# Patient Record
Sex: Male | Born: 1990 | Race: White | Hispanic: No | Marital: Single | Smoking: Never smoker
Health system: Southern US, Community
[De-identification: ages and names within clinical notes are randomized; demographics above are authoritative.]

## PROBLEM LIST (undated history)

## (undated) DIAGNOSIS — K8521 Alcohol induced acute pancreatitis with uninfected necrosis: Secondary | ICD-10-CM

## (undated) DIAGNOSIS — R569 Unspecified convulsions: Secondary | ICD-10-CM

## (undated) DIAGNOSIS — E1065 Type 1 diabetes mellitus with hyperglycemia: Secondary | ICD-10-CM

## (undated) DIAGNOSIS — F1010 Alcohol abuse, uncomplicated: Secondary | ICD-10-CM

## (undated) DIAGNOSIS — F419 Anxiety disorder, unspecified: Secondary | ICD-10-CM

## (undated) DIAGNOSIS — IMO0002 Reserved for concepts with insufficient information to code with codable children: Secondary | ICD-10-CM

## (undated) HISTORY — PX: APPENDECTOMY: SHX54

## (undated) HISTORY — DX: Alcohol induced acute pancreatitis with uninfected necrosis: K85.21

---
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
## 2010-12-07 ENCOUNTER — Emergency Department: Payer: Self-pay | Admitting: Emergency Medicine

## 2013-12-19 ENCOUNTER — Ambulatory Visit: Payer: Self-pay | Attending: Internal Medicine | Admitting: Internal Medicine

## 2013-12-19 ENCOUNTER — Encounter (HOSPITAL_COMMUNITY): Payer: Self-pay | Admitting: Emergency Medicine

## 2013-12-19 ENCOUNTER — Encounter: Payer: Self-pay | Admitting: Internal Medicine

## 2013-12-19 ENCOUNTER — Inpatient Hospital Stay (HOSPITAL_COMMUNITY)
Admission: EM | Admit: 2013-12-19 | Discharge: 2013-12-20 | DRG: 639 | Disposition: A | Discharge status: Home / Self Care | Payer: Self-pay | Attending: Internal Medicine | Admitting: Internal Medicine

## 2013-12-19 VITALS — BP 132/89 | HR 76 | Temp 98.7°F | Resp 16 | Wt 139.6 lb

## 2013-12-19 DIAGNOSIS — R3589 Other polyuria: Secondary | ICD-10-CM

## 2013-12-19 DIAGNOSIS — R824 Acetonuria: Secondary | ICD-10-CM

## 2013-12-19 DIAGNOSIS — E139 Other specified diabetes mellitus without complications: Secondary | ICD-10-CM

## 2013-12-19 DIAGNOSIS — E1139 Type 2 diabetes mellitus with other diabetic ophthalmic complication: Secondary | ICD-10-CM | POA: Insufficient documentation

## 2013-12-19 DIAGNOSIS — R739 Hyperglycemia, unspecified: Secondary | ICD-10-CM

## 2013-12-19 DIAGNOSIS — Z91199 Patient's noncompliance with other medical treatment and regimen due to unspecified reason: Secondary | ICD-10-CM

## 2013-12-19 DIAGNOSIS — R7309 Other abnormal glucose: Secondary | ICD-10-CM

## 2013-12-19 DIAGNOSIS — E860 Dehydration: Secondary | ICD-10-CM

## 2013-12-19 DIAGNOSIS — E1010 Type 1 diabetes mellitus with ketoacidosis without coma: Principal | ICD-10-CM

## 2013-12-19 DIAGNOSIS — E1165 Type 2 diabetes mellitus with hyperglycemia: Secondary | ICD-10-CM | POA: Insufficient documentation

## 2013-12-19 DIAGNOSIS — IMO0002 Reserved for concepts with insufficient information to code with codable children: Secondary | ICD-10-CM | POA: Insufficient documentation

## 2013-12-19 DIAGNOSIS — R000 Tachycardia, unspecified: Secondary | ICD-10-CM | POA: Diagnosis present

## 2013-12-19 DIAGNOSIS — G40909 Epilepsy, unspecified, not intractable, without status epilepticus: Secondary | ICD-10-CM | POA: Insufficient documentation

## 2013-12-19 DIAGNOSIS — E099 Drug or chemical induced diabetes mellitus without complications: Secondary | ICD-10-CM

## 2013-12-19 DIAGNOSIS — E1169 Type 2 diabetes mellitus with other specified complication: Principal | ICD-10-CM

## 2013-12-19 DIAGNOSIS — Z599 Problem related to housing and economic circumstances, unspecified: Secondary | ICD-10-CM

## 2013-12-19 DIAGNOSIS — I498 Other specified cardiac arrhythmias: Secondary | ICD-10-CM | POA: Diagnosis present

## 2013-12-19 DIAGNOSIS — R358 Other polyuria: Secondary | ICD-10-CM

## 2013-12-19 DIAGNOSIS — Z9119 Patient's noncompliance with other medical treatment and regimen: Secondary | ICD-10-CM

## 2013-12-19 DIAGNOSIS — H538 Other visual disturbances: Secondary | ICD-10-CM

## 2013-12-19 DIAGNOSIS — Z598 Other problems related to housing and economic circumstances: Secondary | ICD-10-CM

## 2013-12-19 HISTORY — DX: Type 1 diabetes mellitus with hyperglycemia: E10.65

## 2013-12-19 HISTORY — DX: Reserved for concepts with insufficient information to code with codable children: IMO0002

## 2013-12-19 HISTORY — DX: Unspecified convulsions: R56.9

## 2013-12-19 HISTORY — DX: Anxiety disorder, unspecified: F41.9

## 2013-12-19 LAB — BASIC METABOLIC PANEL
ANION GAP: 18 — AB (ref 5–15)
ANION GAP: 17 — AB (ref 5–15)
ANION GAP: 14 (ref 5–15)
BUN: 8 mg/dL (ref 6–23)
BUN: 7 mg/dL (ref 6–23)
BUN: 7 mg/dL (ref 6–23)
CALCIUM: 9 mg/dL (ref 8.4–10.5)
CALCIUM: 8.2 mg/dL — AB (ref 8.4–10.5)
CALCIUM: 8.4 mg/dL (ref 8.4–10.5)
CO2: 19 meq/L (ref 19–32)
CO2: 21 mEq/L (ref 19–32)
CO2: 23 meq/L (ref 19–32)
Chloride: 100 mEq/L (ref 96–112)
Chloride: 101 mEq/L (ref 96–112)
Chloride: 101 mEq/L (ref 96–112)
Creatinine, Ser: 0.45 mg/dL — ABNORMAL LOW (ref 0.50–1.35)
Creatinine, Ser: 0.49 mg/dL — ABNORMAL LOW (ref 0.50–1.35)
Creatinine, Ser: 0.46 mg/dL — ABNORMAL LOW (ref 0.50–1.35)
GFR calc Af Amer: >90 mL/min (ref >90)
GFR calc Af Amer: >90 mL/min (ref >90)
GFR calc Af Amer: >90 mL/min (ref >90)
GFR calc non Af Amer: >90 mL/min (ref >90)
GFR calc non Af Amer: >90 mL/min (ref >90)
GFR calc non Af Amer: >90 mL/min (ref >90)
GLUCOSE: 177 mg/dL — AB (ref 70–99)
Glucose, Bld: 139 mg/dL — ABNORMAL HIGH (ref 70–99)
Glucose, Bld: 168 mg/dL — ABNORMAL HIGH (ref 70–99)
POTASSIUM: 3.8 meq/L (ref 3.7–5.3)
POTASSIUM: 3.6 meq/L — AB (ref 3.7–5.3)
Potassium: 4.1 mEq/L (ref 3.7–5.3)
SODIUM: 137 meq/L (ref 137–147)
SODIUM: 138 meq/L (ref 137–147)
Sodium: 139 mEq/L (ref 137–147)

## 2013-12-19 LAB — POCT URINALYSIS DIPSTICK
Blood, UA: NEGATIVE
Glucose, UA: 500
Leukocytes, UA: NEGATIVE
NITRITE UA: NEGATIVE
PH UA: 5
SPEC GRAV UA: 1.015
Urobilinogen, UA: 0.2

## 2013-12-19 LAB — URINALYSIS, ROUTINE W REFLEX MICROSCOPIC
BILIRUBIN URINE: NEGATIVE
Glucose, UA: >1000 mg/dL — AB
HGB URINE DIPSTICK: NEGATIVE
Ketones, ur: >80 mg/dL — AB
LEUKOCYTES UA: NEGATIVE
NITRITE: NEGATIVE
Protein, ur: 30 mg/dL — AB
SPECIFIC GRAVITY, URINE: 1.041 — AB (ref 1.005–1.030)
Urobilinogen, UA: 0.2 mg/dL (ref 0.0–1.0)
pH: 5 (ref 5.0–8.0)

## 2013-12-19 LAB — URINE MICROSCOPIC-ADD ON

## 2013-12-19 LAB — COMPREHENSIVE METABOLIC PANEL
ALK PHOS: 119 U/L — AB (ref 39–117)
ALT: 14 U/L (ref 0–53)
AST: 21 U/L (ref 0–37)
Albumin: 4.7 g/dL (ref 3.5–5.2)
Anion gap: 27 — ABNORMAL HIGH (ref 5–15)
BILIRUBIN TOTAL: 0.5 mg/dL (ref 0.3–1.2)
BUN: 10 mg/dL (ref 6–23)
CO2: 16 meq/L — AB (ref 19–32)
Calcium: 9.9 mg/dL (ref 8.4–10.5)
Chloride: 93 mEq/L — ABNORMAL LOW (ref 96–112)
Creatinine, Ser: 0.49 mg/dL — ABNORMAL LOW (ref 0.50–1.35)
GFR calc Af Amer: >90 mL/min (ref >90)
GFR calc non Af Amer: >90 mL/min (ref >90)
Glucose, Bld: 331 mg/dL — ABNORMAL HIGH (ref 70–99)
POTASSIUM: 3.8 meq/L (ref 3.7–5.3)
Sodium: 136 mEq/L — ABNORMAL LOW (ref 137–147)
TOTAL PROTEIN: 7.9 g/dL (ref 6.0–8.3)

## 2013-12-19 LAB — GLUCOSE, CAPILLARY
GLUCOSE-CAPILLARY: 122 mg/dL — AB (ref 70–99)
GLUCOSE-CAPILLARY: 166 mg/dL — AB (ref 70–99)
GLUCOSE-CAPILLARY: 174 mg/dL — AB (ref 70–99)
GLUCOSE-CAPILLARY: 178 mg/dL — AB (ref 70–99)
GLUCOSE-CAPILLARY: 200 mg/dL — AB (ref 70–99)
Glucose-Capillary: 170 mg/dL — ABNORMAL HIGH (ref 70–99)
Glucose-Capillary: 169 mg/dL — ABNORMAL HIGH (ref 70–99)
Glucose-Capillary: 168 mg/dL — ABNORMAL HIGH (ref 70–99)
Glucose-Capillary: 174 mg/dL — ABNORMAL HIGH (ref 70–99)

## 2013-12-19 LAB — CBC
HCT: 45.5 % (ref 39.0–52.0)
Hemoglobin: 16.5 g/dL (ref 13.0–17.0)
MCH: 34.6 pg — AB (ref 26.0–34.0)
MCHC: 36.3 g/dL — ABNORMAL HIGH (ref 30.0–36.0)
MCV: 95.4 fL (ref 78.0–100.0)
Platelets: 202 10*3/uL (ref 150–400)
RBC: 4.77 MIL/uL (ref 4.22–5.81)
RDW: 12.1 % (ref 11.5–15.5)
WBC: 5.7 10*3/uL (ref 4.0–10.5)

## 2013-12-19 LAB — CBG MONITORING, ED: Glucose-Capillary: 346 mg/dL — ABNORMAL HIGH (ref 70–99)

## 2013-12-19 LAB — POCT GLYCOSYLATED HEMOGLOBIN (HGB A1C): HEMOGLOBIN A1C: 13.9

## 2013-12-19 LAB — GLUCOSE, POCT (MANUAL RESULT ENTRY): POC Glucose: 475 mg/dl — AB (ref 70–99)

## 2013-12-19 LAB — TSH: TSH: 2.4 u[IU]/mL (ref 0.350–4.500)

## 2013-12-19 MED ORDER — POTASSIUM CHLORIDE 10 MEQ/100ML IV SOLN
10.0000 meq | INTRAVENOUS | Status: AC
  Administered 2013-12-19 (×2): 10 meq via INTRAVENOUS
  Filled 2013-12-19 (×2): qty 100

## 2013-12-19 MED ORDER — ENOXAPARIN SODIUM 40 MG/0.4ML ~~LOC~~ SOLN
40.0000 mg | SUBCUTANEOUS | Status: DC
  Administered 2013-12-19: 40 mg via SUBCUTANEOUS
  Filled 2013-12-19 (×2): qty 0.4

## 2013-12-19 MED ORDER — ONDANSETRON HCL 4 MG/2ML IJ SOLN: 4.0000 mg | Freq: Four times a day (QID) | INTRAMUSCULAR | Status: DC | PRN

## 2013-12-19 MED ORDER — INSULIN ASPART 100 UNIT/ML ~~LOC~~ SOLN
20.0000 [IU] | Freq: Once | SUBCUTANEOUS | Status: AC
  Administered 2013-12-19: 20 [IU] via SUBCUTANEOUS

## 2013-12-19 MED ORDER — SODIUM CHLORIDE 0.9 % IV SOLN
INTRAVENOUS | Status: DC
Start: 2013-12-19 — End: 2013-12-20
  Administered 2013-12-20: 100 mL/h via INTRAVENOUS

## 2013-12-19 MED ORDER — ACETAMINOPHEN 325 MG PO TABS: 650.0000 mg | ORAL_TABLET | Freq: Four times a day (QID) | ORAL | Status: DC | PRN

## 2013-12-19 MED ORDER — POTASSIUM CHLORIDE CRYS ER 20 MEQ PO TBCR: 40.0000 meq | EXTENDED_RELEASE_TABLET | Freq: Once | ORAL | Status: DC

## 2013-12-19 MED ORDER — SODIUM CHLORIDE 0.9 % IV BOLUS (SEPSIS)
1000.0000 mL | Freq: Once | INTRAVENOUS | Status: AC
  Administered 2013-12-19: 1000 mL via INTRAVENOUS

## 2013-12-19 MED ORDER — INSULIN REGULAR HUMAN 100 UNIT/ML IJ SOLN
INTRAMUSCULAR | Status: DC
  Filled 2013-12-19: qty 2.5

## 2013-12-19 MED ORDER — DOCUSATE SODIUM 100 MG PO CAPS
100.0000 mg | ORAL_CAPSULE | Freq: Two times a day (BID) | ORAL | Status: DC
  Filled 2013-12-19 (×2): qty 1

## 2013-12-19 MED ORDER — DEXTROSE 50 % IV SOLN: 25.0000 mL | INTRAVENOUS | Status: DC | PRN

## 2013-12-19 MED ORDER — SODIUM CHLORIDE 0.9 % IV SOLN
INTRAVENOUS | Status: AC
  Administered 2013-12-19: 16:00:00 via INTRAVENOUS

## 2013-12-19 MED ORDER — DEXTROSE-NACL 5-0.45 % IV SOLN
INTRAVENOUS | Status: DC
  Administered 2013-12-19 – 2013-12-20 (×2): via INTRAVENOUS

## 2013-12-19 MED ORDER — SODIUM CHLORIDE 0.9 % IV SOLN
INTRAVENOUS | Status: AC
  Administered 2013-12-19: 0.6 [IU]/h via INTRAVENOUS
  Filled 2013-12-19: qty 2.5

## 2013-12-19 NOTE — Progress Notes (Signed)
Pt admitted to the unit at 1526. Pt mental status is A&Ox3. Pt oriented to room, staff, and call bell. Skin is intact. Full assessment charted in [facility]. Call bell within reach. Visitor guidelines reviewed w/ pt and/or family.

## 2013-12-19 NOTE — Progress Notes (Signed)
Patient here to establish care Has history of DM Has not taken his medication in over a year Patient being sent to the ED for evaluation Spoke with [place] in the ED and they are aware of his diagnosis

## 2013-12-19 NOTE — Progress Notes (Deleted)
History and Physical  STIRLING ORTON ZOX:[id number] DOB: 03-05-1991 DOA: 12/19/2013  Referring physician: Dr. Zadie Rhine, EDP. PCP: Doris Cheadle, MD  Outpatient Specialists:  1. None  Chief Complaint: Increased thirst, frequent urination, blurred vision, headache, nausea.  HPI: Christopher Robles is a 23 y.o. male with history of type 1 diabetes, seizure disorder, anxiety, has not been on any prescription medications for a little over a year secondary to lack of insurance and financial constraints, presents to the ED from [facility] with the above complaints. Patient states that he used to be on metformin 500 mg bid,  Lantus 10 units HS & NovoLog SSI with reasonable blood sugar control in the 90s-200s. He then lost his job and emergency Medicaid following which he was unable to afford medications. He has prolonged history of polyuria (20-30 times in the day and several times at night), polydipsia, intermittent tingling and numbness of fingers, leg pains, headache, blurred vision and "not thinking straight". Today he went to the home health clinic to see if they would be able to provide him with some assistance with medications. His blood sugars were noted to be markedly elevated and he had ketones in the urine. Hemoglobin A1c was 13.9. He was referred to ED for admission. In the ED, blood glucose 331 and anion gap 27. Hospitalist admission requested. Patient states that the last seizure was 3-4 years ago has not been on antiepileptics.   Review of Systems: All systems reviewed and apart from history of presenting illness, are negative.  Past Medical History  Diagnosis Date  . DM I (diabetes mellitus, type I), uncontrolled   . Anxiety   . Seizure    Past Surgical History  Procedure Laterality Date  . Appendectomy     Social History:  reports that he has never smoked. He does not have any smokeless tobacco history on file. He reports that he does not  drink alcohol or use illicit drugs. single. Unemployed. Does not have a fixed place to live and jumps around from place to place-Brother, friends and parents.   No Known Allergies  Family History  Problem Relation Age of Onset  . Cancer Maternal Aunt   . Cancer Maternal Uncle   . Heart disease Paternal Grandfather     Prior to Admission medications   Not on File   Physical Exam: Filed Vitals:   12/19/13 1415 12/19/13 1445 12/19/13 1500 12/19/13 1527  BP: 113/80 109/73 114/73   Pulse: 104 102 102 106  Temp:    98.3 F (36.8 C)  TempSrc:    Oral  Resp: Height:     (1.676 m)  Weight:    63.866 kg (140 lb 12.8 oz)  SpO2: 100% 100% 99% 99%     General exam: Moderately built and nourished  pleasant young male patient,  sitting up comfortably on the gurney in no obvious distress.  Head, eyes and ENT: Nontraumatic and normocephalic. Pupils equally reacting to light and accommodation. Oral mucosa  dry. Ketone breath.   Neck: Supple. No JVD, carotid bruit or thyromegaly. Tattoo on right side of neck.   Lymphatics: No lymphadenopathy.  Respiratory system: Clear to auscultation. No increased work of breathing.  Cardiovascular system: S1 and S2 heard, RRR. No JVD, murmurs, gallops, clicks or pedal edema.  Gastrointestinal system: Abdomen is nondistended, soft and nontender. Normal bowel sounds heard. No organomegaly or masses appreciated.  Central nervous system: Alert  and oriented. No focal neurological deficits.  Extremities: Symmetric 5 x 5 power. Peripheral pulses symmetrically felt.   Skin: No rashes or acute findings.  Musculoskeletal system: Negative exam.  Psychiatry: Pleasant and cooperative.   Labs on Admission:  Basic Metabolic Panel:  Recent Labs Lab 12/19/13 1253  NA 136*  K 3.8  CL 93*  CO2 16*  GLUCOSE 331*  BUN 10  CREATININE 0.49*  CALCIUM 9.9   Liver Function Tests:  Recent Labs Lab 12/19/13 1253  AST 21  ALT 14    ALKPHOS 119*  BILITOT 0.5  PROT 7.9  ALBUMIN 4.7   No results found for this basename: LIPASE, AMYLASE,  in the last 168 hours No results found for this basename: AMMONIA,  in the last 168 hours CBC:  Recent Labs Lab 12/19/13 1253  WBC 5.7  HGB 16.5  HCT 45.5  MCV 95.4  PLT 202   Cardiac Enzymes: No results found for this basename: CKTOTAL, CKMB, CKMBINDEX, TROPONINI,  in the last 168 hours  BNP (last 3 results) No results found for this basename: PROBNP,  in the last 8760 hours CBG:  Recent Labs Lab 12/19/13 1313  GLUCAP 346*    Radiological Exams on Admission: No results found.  EKG: No EKG seen in Epic. Will order.  Assessment/Plan Principal Problem:   DKA, type 1, not at goal - Secondary to medication noncompliance from financial constraints. - Aggressive IV fluid hydration. - Start insulin drip per DKA protocol and monitor BMP closely. - Once his DKA has resolved, transition to Lantus and SSI. - Diabetes coordinator and case management consultation for diabetes management and medication assistance respectively. - Patient states that he may be able to afford up to $ 50 worth of medications per month by assistance from his family (Relion brand [employer] Insulin may be an option)  Active Problems:   Dehydration - IVF    Sinus tachycardia - Check EKG and TSH. - Asymptomatic    History of seizure disorder - No reported seizure for 3-4 years    Anxiety - Not on medications    Noncompliance with diabetes treatment/ Financial difficulties - CM assistance for medication Mx.      Code Status: Full  Family Communication: None at the bedside.  Disposition Plan: Home when medically stable   Time spent: 55 minutes.  Marcellus Scott, MD, FACP, FHM. [facility] Pager [phone]  If 7PM-7AM, please contact night-coverage [email or web address] Password [facility] 12/19/2013, 3:31 PM

## 2013-12-19 NOTE — ED Provider Notes (Signed)
CSN: [id number]     Arrival date & time 12/19/13  1240 History   First MD Initiated Contact with Patient 12/19/13 1305     Chief Complaint  Patient presents with  . Hyperglycemia      Patient is a 23 y.o. male presenting with hyperglycemia. The history is provided by the patient.  Hyperglycemia Severity:  Severe Onset quality:  Gradual Duration: several weeks. Timing:  Constant Progression:  Worsening Chronicity:  Recurrent Current diabetic therapy:  None Relieved by:  None tried Ineffective treatments:  None tried Associated symptoms: blurred vision, increased thirst and polyuria   Associated symptoms: no abdominal pain, no chest pain, no fever and no shortness of breath   Pt reports he has h/o untreated diabetes He has noted increased glucose readings at home He also reports blurred vision, polyuria and feels thirsty   PMH - DIABETES  Past Surgical History  Procedure Laterality Date  . Appendectomy     Family History  Problem Relation Age of Onset  . Cancer Maternal Aunt   . Cancer Maternal Uncle   . Heart disease Paternal Grandfather    History  Substance Use Topics  . Smoking status: Never Smoker   . Smokeless tobacco: Not on file  . Alcohol Use: No    Review of Systems  Constitutional: Negative for fever.  Eyes: Positive for blurred vision.  Respiratory: Negative for shortness of breath.   Cardiovascular: Negative for chest pain.  Gastrointestinal: Negative for abdominal pain.  Endocrine: Positive for polydipsia and polyuria.  All other systems reviewed and are negative.     Allergies  Review of patient's allergies indicates no known allergies.  Home Medications   Prior to Admission medications   Not on File   BP 154/99  Pulse 135  Temp(Src) 97.9 F (36.6 C) (Oral)  Resp 18  SpO2 100% Physical Exam CONSTITUTIONAL: Well developed/well nourished, pt smells ketotic HEAD: Normocephalic/atraumatic EYES: EOMI/PERRL ENMT: Mucous membranes  moist NECK: supple no meningeal signs SPINE:entire spine nontender CV: tachycardic S1/S2 noted, no murmurs/rubs/gallops noted LUNGS: Lungs are clear to auscultation bilaterally, no apparent distress ABDOMEN: soft, nontender, no rebound or guarding GU:no cva tenderness NEURO: Pt is awake/alert, moves all extremitiesx4 EXTREMITIES: pulses normal, full ROM SKIN: warm, color normal PSYCH: no abnormalities of mood noted   ED Course  Procedures 1:45 PM Suspect DKA 2:43 PM PT WILL BE ADMITTED TO TELE AFTER DISCUSSION WITH DR HONGALGI PT AWAKE/ALERT, NO DISTRESS, BUT WILL REQUIRE IV FLUIDS,IV INSULIN AND MONITORING OF HYPOKALEMIA Labs Review Labs Reviewed  CBC - Abnormal; Notable for the following:    MCH 34.6 (*)    MCHC 36.3 (*)    All other components within normal limits  COMPREHENSIVE METABOLIC PANEL - Abnormal; Notable for the following:    Sodium 136 (*)    Chloride 93 (*)    CO2 16 (*)    Glucose, Bld 331 (*)    Creatinine, Ser 0.49 (*)    Alkaline Phosphatase 119 (*)    Anion gap 27 (*)    All other components within normal limits  URINALYSIS, ROUTINE W REFLEX MICROSCOPIC - Abnormal; Notable for the following:    APPearance HAZY (*)    Specific Gravity, Urine 1.041 (*)    Glucose, UA >1000 (*)    Ketones, ur >80 (*)    Protein, ur 30 (*)    All other components within normal limits  CBG MONITORING, ED - Abnormal; Notable for the following:    Glucose-Capillary 346 (*)  All other components within normal limits  URINE MICROSCOPIC-ADD ON  BLOOD GAS, VENOUS     MDM   Final diagnoses:  Diabetic ketoacidosis without coma associated with type 1 diabetes mellitus    Nursing notes including past medical history and social history reviewed and considered in documentation Labs/vital reviewed and considered     Joya Gaskins, MD 12/19/13 1444

## 2013-12-19 NOTE — ED Notes (Signed)
PT from PCP, sent here to be evaluated for possible DKA. Pt has Type 1 DM, states he has been out of medication for appx 1 year. Pt denies any complaint. Noted to be tachy 120-130's in triage. AO x4, NAD.

## 2013-12-19 NOTE — H&P (Signed)
History and Physical  Christopher FRAIZER Robles:[id number] DOB: 07-15-1990 DOA: 12/19/2013  Referring physician: Dr. Zadie Rhine, EDP. PCP: Doris Cheadle, MD  Outpatient Specialists:  1. None  Chief Complaint: Increased thirst, frequent urination, blurred vision, headache, nausea.  HPI: Christopher Robles is a 23 y.o. male with history of type 1 diabetes, seizure disorder, anxiety, has not been on any prescription medications for a little over a year secondary to lack of insurance and financial constraints, presents to the ED from [facility] with the above complaints. Patient states that he used to be on metformin 500 mg bid,  Lantus 10 units HS & NovoLog SSI with reasonable blood sugar control in the 90s-200s. He then lost his job and emergency Medicaid following which he was unable to afford medications. He has prolonged history of polyuria (20-30 times in the day and several times at night), polydipsia, intermittent tingling and numbness of fingers, leg pains, headache, blurred vision and "not thinking straight". Today he went to the home health clinic to see if they would be able to provide him with some assistance with medications. His blood sugars were noted to be markedly elevated and he had ketones in the urine. Hemoglobin A1c was 13.9. He was referred to ED for admission. In the ED, blood glucose 331 and anion gap 27. Hospitalist admission requested. Patient states that the last seizure was 3-4 years ago has not been on antiepileptics.   Review of Systems: All systems reviewed and apart from history of presenting illness, are negative.  Past Medical History  Diagnosis Date  . DM I (diabetes mellitus, type I), uncontrolled   . Anxiety   . Seizure    Past Surgical History  Procedure Laterality Date  . Appendectomy     Social History:  reports that he has never smoked. He does not have any smokeless tobacco history on file. He reports that he does not  drink alcohol or use illicit drugs. single. Unemployed. Does not have a fixed place to live and jumps around from place to place-Brother, friends and parents.   No Known Allergies  Family History  Problem Relation Age of Onset  . Cancer Maternal Aunt   . Cancer Maternal Uncle   . Heart disease Paternal Grandfather     Prior to Admission medications   Not on File   Physical Exam: Filed Vitals:   12/19/13 1415 12/19/13 1445 12/19/13 1500 12/19/13 1527  BP: 113/80 109/73 114/73   Pulse: 104 102 102 106  Temp:    98.3 F (36.8 C)  TempSrc:    Oral  Resp: Height:     (1.676 m)  Weight:    63.866 kg (140 lb 12.8 oz)  SpO2: 100% 100% 99% 99%     General exam: Moderately built and nourished  pleasant young male patient,  sitting up comfortably on the gurney in no obvious distress.  Head, eyes and ENT: Nontraumatic and normocephalic. Pupils equally reacting to light and accommodation. Oral mucosa  dry. Ketone breath.   Neck: Supple. No JVD, carotid bruit or thyromegaly. Tattoo on right side of neck.   Lymphatics: No lymphadenopathy.  Respiratory system: Clear to auscultation. No increased work of breathing.  Cardiovascular system: S1 and S2 heard, RRR. No JVD, murmurs, gallops, clicks or pedal edema.  Gastrointestinal system: Abdomen is nondistended, soft and nontender. Normal bowel sounds heard. No organomegaly or masses appreciated.  Central nervous system: Alert  and oriented. No focal neurological deficits.  Extremities: Symmetric 5 x 5 power. Peripheral pulses symmetrically felt.   Skin: No rashes or acute findings.  Musculoskeletal system: Negative exam.  Psychiatry: Pleasant and cooperative.   Labs on Admission:  Basic Metabolic Panel:  Recent Labs Lab 12/19/13 1253  NA 136*  K 3.8  CL 93*  CO2 16*  GLUCOSE 331*  BUN 10  CREATININE 0.49*  CALCIUM 9.9   Liver Function Tests:  Recent Labs Lab 12/19/13 1253  AST 21  ALT 14    ALKPHOS 119*  BILITOT 0.5  PROT 7.9  ALBUMIN 4.7   No results found for this basename: LIPASE, AMYLASE,  in the last 168 hours No results found for this basename: AMMONIA,  in the last 168 hours CBC:  Recent Labs Lab 12/19/13 1253  WBC 5.7  HGB 16.5  HCT 45.5  MCV 95.4  PLT 202   Cardiac Enzymes: No results found for this basename: CKTOTAL, CKMB, CKMBINDEX, TROPONINI,  in the last 168 hours  BNP (last 3 results) No results found for this basename: PROBNP,  in the last 8760 hours CBG:  Recent Labs Lab 12/19/13 1313 12/19/13 1538 12/19/13 1639  GLUCAP 346* 122* 166*    Radiological Exams on Admission: No results found.  EKG: No EKG seen in Epic. Will order.  Assessment/Plan Principal Problem:   DKA, type 1, not at goal - Secondary to medication noncompliance from financial constraints. - Aggressive IV fluid hydration. - Start insulin drip per DKA protocol and monitor BMP closely. - Once his DKA has resolved, transition to Lantus and SSI. - Diabetes coordinator and case management consultation for diabetes management and medication assistance respectively. - Patient states that he may be able to afford up to $ 50 worth of medications per month by assistance from his family (Relion brand [employer] Insulin may be an option)  Active Problems:   Dehydration - IVF    Sinus tachycardia - Check EKG and TSH. - Asymptomatic    History of seizure disorder - No reported seizure for 3-4 years    Anxiety - Not on medications    Noncompliance with diabetes treatment/ Financial difficulties - CM assistance for medication Mx.      Code Status: Full  Family Communication: None at the bedside.  Disposition Plan: Home when medically stable   Time spent: 55 minutes.  Marcellus Scott, MD, FACP, FHM. [facility] Pager [phone]  If 7PM-7AM, please contact night-coverage [email or web address] Password [facility] 12/19/2013, 5:08 PM

## 2013-12-19 NOTE — Progress Notes (Signed)
Received patient assignment at 1458. Called ED for report at 1504. Awaiting patient arrival to floor.

## 2013-12-19 NOTE — Progress Notes (Signed)
  CARE MANAGEMENT ED NOTE 12/19/2013  Patient:  Christopher Robles, Christopher Robles   Account Number:  [id number]  Date Initiated:  12/19/2013  Documentation initiated by:  Ferdinand Cava  Subjective/Objective Assessment:   23 yo presenting to the ED for follow up from [facility] regarding CBG     Subjective/Objective Assessment Detail:     Action/Plan:   Patient will continue to follow at the [facility] for PCP and medication refills and management   Action/Plan Detail:   Anticipated DC Date:       Status Recommendation to Physician:   Result of Recommendation:  Agreed    DC Planning Services  CM consult  Other    Choice offered to / List presented to:            Status of service:  Completed, signed off  ED Comments:   ED Comments Detail:  This CM was consulted to discussed medication management with the patient and his step mother. This CM spoke with the patient and the step mother Montrez Marietta and they stated that they had the initial appointment at the [facility] with a PCP and then he was sent to the ED for care for high CBG. They were asking about prescription refills and resources. This CM explained that once the patient was discharged they can take the prescriptions to the [facility] to have them filled. Also discussed the orange card and they stated that they were told to walk-in on Thursday to apply for the orange card. This CM explained the orange card to the patient and step mother. Both verbalized understanding and had no other questions or concerns.

## 2013-12-19 NOTE — Progress Notes (Signed)
Patient Demographics  Christopher Robles, is a 23 y.o. male  NWG:[id number]  VHQ:[id number]  DOB - [birth year]  CC:  Chief Complaint  Patient presents with  . Establish Care    dm       HPI: Christopher Robles is a 23 y.o. male here today to establish medical care. Patient has history of diabetes, history of seizure disorder as per patient he did not have any more seizures for the last 3-4 years is not taking any medication for that, used to be on NovoLog Lantus metformin for diabetes but for the last one year he has been not on any medications reported to have symptoms of polyuria polydipsia dry mouth blurry vision numbness tingling in the feet. Today's blood sugar is elevated as per patient he has checked his blood sugar at home and usually gets around 500 mg a deciliter, his urine is positive for ketones. Patient has No headache, No chest pain, No abdominal pain - No Nausea, No new weakness tingling or numbness, No Cough - SOB.  No Known Allergies History reviewed. No pertinent past medical history. No current outpatient prescriptions on file prior to visit.   No current facility-administered medications on file prior to visit.   Family History  Problem Relation Age of Onset  . Cancer Maternal Aunt   . Cancer Maternal Uncle   . Heart disease Paternal Grandfather    History   Social History  . Marital Status: Single    Spouse Name: N/A    Number of Children: N/A  . Years of Education: N/A   Occupational History  . Not on file.   Social History Main Topics  . Smoking status: Never Smoker   . Smokeless tobacco: Not on file  . Alcohol Use: No  . Drug Use: Not on file  . Sexual Activity: Not on file   Other Topics Concern  . Not on file   Social History Narrative  . No narrative on file    Review of Systems: Constitutional: Negative for fever, +chills, diaphoresis, activity change, appetite change and fatigue. HENT: Negative for ear pain, nosebleeds, congestion, facial  swelling, rhinorrhea, neck pain, neck stiffness and ear discharge.  Eyes: Negative for pain, discharge, redness, itching and +visual disturbance. Respiratory: Negative for cough, choking, chest tightness, shortness of breath, wheezing and stridor.  Cardiovascular: Negative for chest pain, palpitations and leg swelling. Gastrointestinal: Negative for abdominal distention. Genitourinary: Negative for dysuria, urgency, +frequency, hematuria, flank pain, decreased urine volume, difficulty urinating and dyspareunia.  Musculoskeletal: Negative for back pain, joint swelling, arthralgia and gait problem. Neurological: Negative for dizziness, tremors, seizures, syncope, facial asymmetry, speech difficulty, weakness, light-headedness, numbness and headaches.  Hematological: Negative for adenopathy. Does not bruise/bleed easily. Psychiatric/Behavioral: Negative for hallucinations, behavioral problems, confusion, dysphoric mood, decreased concentration and agitation.    Objective:   Filed Vitals:   12/19/13 1201  BP: 132/89  Pulse: 76  Temp: 98.7 F (37.1 C)  Resp: 16    Physical Exam: Constitutional: Patient appears well-developed and well-nourished. No distress. HENT: Normocephalic, atraumatic, External right and left ear normal. Oropharynx is clear and moist.  Eyes: Conjunctivae and EOM are normal. PERRLA, no scleral icterus. Neck: Normal ROM. Neck supple. No JVD. No tracheal deviation. No thyromegaly. CVS: RRR, S1/S2 +, no murmurs, no gallops, no carotid bruit.  Pulmonary: Effort and breath sounds normal, no stridor, rhonchi, wheezes, rales.  Abdominal: Soft. BS +, no distension, tenderness, rebound or guarding.  Musculoskeletal: Normal range of motion. No edema  and no tenderness.  Neuro: Alert. Normal reflexes, muscle tone coordination. No cranial nerve deficit. Skin: Skin is warm and dry. No rash noted. Not diaphoretic. No erythema. No pallor. Psychiatric: Normal mood and affect.  Behavior, judgment, thought content normal.  Lab Results  Component Value Date   WBC 7.7 08/09/2007   HGB 15.6 08/09/2007   HCT 45.8 08/09/2007   MCV 97.2 08/09/2007   PLT 242 08/09/2007   Lab Results  Component Value Date   CREATININE 0.67 08/09/2007   BUN 11 08/09/2007   NA 139 08/09/2007   K 4.1 08/09/2007   CL 103 08/09/2007   CO2 23 08/09/2007    Lab Results  Component Value Date   HGBA1C 13.9 12/19/2013   Lipid Panel     Component Value Date/Time   CHOL 158 08/09/2007 2026   TRIG 146 08/09/2007 2026   HDL 36 08/09/2007 2026   CHOLHDL 4.4 Ratio 08/09/2007 2026   VLDL 29 08/09/2007 2026   LDLCALC 93 08/09/2007 2026       Assessment and plan:   1.  diabetes mellitus without complication Results for orders placed in visit on 12/19/13  GLUCOSE, POCT (MANUAL RESULT ENTRY)      Result Value Ref Range   POC Glucose 475 (*) 70 - 99 mg/dl  POCT GLYCOSYLATED HEMOGLOBIN (HGB A1C)      Result Value Ref Range   Hemoglobin A1C 13.9    POCT URINALYSIS DIPSTICK      Result Value Ref Range   Color, UA yellow     Clarity, UA clear     Glucose, UA 500     Bilirubin, UA small     Ketones, UA >=160mg /dl     Spec Grav, UA 1.610     Blood, UA neg     pH, UA 5.0     Protein, UA      Urobilinogen, UA 0.2     Nitrite, UA neg     Leukocytes, UA Negative      - Glucose (CBG) - HgB A1c - Urinalysis Dipstick - insulin aspart (novoLOG) injection 20 Units; Inject 0.2 mLs (20 Units total) into the skin once.  2. Hyperglycemia/ 3. Polyuria/4. Blurry vision/5. Urine ketones   Patient has uncontrolled diabetes with current A1c of 13.9%, patient also symptomatic elevated sugar ketones positive in urine, I have referred him to ER, patient needs to have electrolytes checked, checked for anion gap, needs IV hydration and insulin., Patient will follow up after discharge.     Doris Cheadle, MD

## 2013-12-20 DIAGNOSIS — E1010 Type 1 diabetes mellitus with ketoacidosis without coma: Secondary | ICD-10-CM

## 2013-12-20 LAB — CBC
HEMATOCRIT: 41.5 % (ref 39.0–52.0)
Hemoglobin: 15 g/dL (ref 13.0–17.0)
MCH: 35.5 pg — ABNORMAL HIGH (ref 26.0–34.0)
MCHC: 36.1 g/dL — ABNORMAL HIGH (ref 30.0–36.0)
MCV: 98.1 fL (ref 78.0–100.0)
Platelets: 173 10*3/uL (ref 150–400)
RBC: 4.23 MIL/uL (ref 4.22–5.81)
RDW: 12.3 % (ref 11.5–15.5)
WBC: 4.7 10*3/uL (ref 4.0–10.5)

## 2013-12-20 LAB — GLUCOSE, CAPILLARY
GLUCOSE-CAPILLARY: 113 mg/dL — AB (ref 70–99)
GLUCOSE-CAPILLARY: 256 mg/dL — AB (ref 70–99)
Glucose-Capillary: 168 mg/dL — ABNORMAL HIGH (ref 70–99)
Glucose-Capillary: 224 mg/dL — ABNORMAL HIGH (ref 70–99)

## 2013-12-20 LAB — BASIC METABOLIC PANEL
Anion gap: 18 — ABNORMAL HIGH (ref 5–15)
BUN: 8 mg/dL (ref 6–23)
CO2: 19 mEq/L (ref 19–32)
Calcium: 8.6 mg/dL (ref 8.4–10.5)
Chloride: 99 mEq/L (ref 96–112)
Creatinine, Ser: 0.42 mg/dL — ABNORMAL LOW (ref 0.50–1.35)
GFR calc Af Amer: >90 mL/min (ref >90)
GLUCOSE: 260 mg/dL — AB (ref 70–99)
Potassium: 3.9 mEq/L (ref 3.7–5.3)
Sodium: 136 mEq/L — ABNORMAL LOW (ref 137–147)

## 2013-12-20 LAB — TROPONIN I

## 2013-12-20 MED ORDER — INSULIN ASPART 100 UNIT/ML ~~LOC~~ SOLN: 0.0000 [IU] | Freq: Every day | SUBCUTANEOUS | Status: DC

## 2013-12-20 MED ORDER — INSULIN GLARGINE 100 UNIT/ML ~~LOC~~ SOLN
15.0000 [IU] | Freq: Every day | SUBCUTANEOUS | Status: DC
  Administered 2013-12-20: 15 [IU] via SUBCUTANEOUS
  Filled 2013-12-20 (×2): qty 0.15

## 2013-12-20 MED ORDER — INSULIN NPH ISOPHANE & REGULAR (70-30) 100 UNIT/ML ~~LOC~~ SUSP: 15.0000 [IU] | Freq: Two times a day (BID) | SUBCUTANEOUS | Status: DC

## 2013-12-20 MED ORDER — GLUCOSE 40 % PO GEL: 1.0000 | ORAL | Status: DC | PRN

## 2013-12-20 MED ORDER — INSULIN ASPART 100 UNIT/ML ~~LOC~~ SOLN
0.0000 [IU] | Freq: Three times a day (TID) | SUBCUTANEOUS | Status: DC
  Administered 2013-12-20: 5 [IU] via SUBCUTANEOUS
  Administered 2013-12-20: 8 [IU] via SUBCUTANEOUS

## 2013-12-20 MED ORDER — DEXTROSE 50 % IV SOLN: 25.0000 mL | Freq: Once | INTRAVENOUS | Status: DC | PRN

## 2013-12-20 MED ORDER — DEXTROSE 50 % IV SOLN: 50.0000 mL | Freq: Once | INTRAVENOUS | Status: DC | PRN

## 2013-12-20 MED ORDER — POTASSIUM CHLORIDE CRYS ER 20 MEQ PO TBCR
20.0000 meq | EXTENDED_RELEASE_TABLET | Freq: Once | ORAL | Status: AC
  Administered 2013-12-20: 20 meq via ORAL
  Filled 2013-12-20: qty 1

## 2013-12-20 NOTE — Discharge Instructions (Signed)

## 2013-12-20 NOTE — Discharge Summary (Signed)
Physician Discharge Summary  Christopher Robles:[id number] DOB: May 05, 1990 DOA: 12/19/2013  PCP: Doris Cheadle, MD  Admit date: 12/19/2013 Discharge date: 12/20/2013  Time spent:  minutes  Recommendations for Outpatient Follow-up:   1.  Establish a Primary Care Provider at the [facility]) for the management of your diabetes. 2. You have been prescribed Novolin for your diabetes, take 15 units twice daily, with breakfast and supper. 3. If you develop abdominal pain, numbness, tingling, muscle cramps, or visual changes, report to the ED      Discharge Condition: Stable  Diet recommendation: Carb modified diet  Filed Weights   12/19/13 1527  Weight: 63.866 kg (140 lb 12.8 oz)    History of present illness:  Christopher Robles is a 23 y.o. male with history of type 1 diabetes, seizure disorder, anxiety, presented to the ED from the [facility] with complaints of frequent urination, blurred vision,increased thirst, nausea, and headache. At the [facility] he was found to have markedly elevated ketones in his urine, and his A1c was 13.9. He reported a prolonged history of polyuria,  polydipsia, intermittent tingling and numbness of fingers, leg pains, headache, blurred vision and "not thinking straight". He stated that he was taking Metformin 500 mg bid, Lantus 10 units HS & NovoLog SSI.  However, he lost his job, and as a result, he has not been able to afford his diabetes medication for the past year.   In the ED, blood glucose was 331 and he had an anion gap 27.  He was hydrated with IV fluids, Placed on IV insulin, and then transitioned to Lantus and SSI.  Hospital Course:   DKA, type 1, not at goal   Secondary to medication noncompliance from financial constraints.   Will be on Novolog 70/30 as patient is unable to afford lantus  Diabetes coordinator and case management consultation for diabetes management and medication  assistance respectively.  -Patient has been  transitioned to subcutaneous insulin -Due to the patient's financial constraints, the patient will be discharged with novolin 70/30, 15units bid -Patient was instructed to check his CBGs before each meal and at bedtime and to keep her glycemic log -He was instructed to take his glycemic log of his primary care provider who will adjust his insulin regimen -Patient was provided a prescription for a glucometer, test strips, lancets, needles and syringes -RN provided education and the patient felt comfortable administering his own insulin prior to discharge  Diabetes mellitus type 1, poorly controlled -Hemoglobin A1c 13.9 -TSH 2.40 Dehydration    Resolved with aggressive fluid resuscitation  Sinus tachycardia   Resolved   History of seizure disorder   No reported seizure for 3-4 years  -Would not start any antiepileptic medications at this time  Anxiety   not on medication    Procedures:  None  Consultations:  None  Discharge Exam: Filed Vitals:   12/20/13 1031  BP: 121/84  Pulse: 92  Temp: 98 F (36.7 C)  Resp: 18     Exam Gen: Alert and oriented, on no acute respiratory distress  HEENT:  Normocephalic, nontraumatic.  Pupils symmetrical.  Moist oral mucosa. Ketone breath  Chest: clear to auscultate bilaterally, no ronchi or rales  Cardiac: Regular rate and rhythm, S1-S2, no rubs murmurs or gallops  Abdomen: soft, non tender, non distended, +bowel sounds. No guarding or rigidity  Extremities: Symmetrical in appearance without cyanosis, clubbing or effusion, or edema  Psychiatry: pleasant and cooperative  Discharge Instructions  Medication List         insulin NPH-regular Human (70-30) 100 UNIT/ML injection  Commonly known as:  NOVOLIN 70/30  Inject 15 Units into the skin 2 (two) times daily with a meal.       No Known Allergies    The results of significant diagnostics from this hospitalization (including imaging, microbiology, ancillary and  laboratory) are listed below for reference.    Significant Diagnostic Studies: No results found.  Microbiology: No results found for this or any previous visit (from the past 240 hour(s)).   Labs: Basic Metabolic Panel:  Recent Labs Lab 12/19/13 1253 12/19/13 1641 12/19/13 1930 12/19/13 2223 12/20/13 0810  NA 136* 137 139 138 136*  K 3.8 3.8 4.1 3.6* 3.9  CL 93* 100 101 101 99  CO2 16* GLUCOSE 331* 139* 177* 168* 260*  BUN CREATININE 0.49* 0.45* 0.49* 0.46* 0.42*  CALCIUM 9.9 9.0 8.2* 8.4 8.6   Liver Function Tests:  Recent Labs Lab 12/19/13 1253  AST 21  ALT 14  ALKPHOS 119*  BILITOT 0.5  PROT 7.9  ALBUMIN 4.7   No results found for this basename: LIPASE, AMYLASE,  in the last 168 hours No results found for this basename: AMMONIA,  in the last 168 hours CBC:  Recent Labs Lab 12/19/13 1253 12/20/13 0810  WBC 5.7 4.7  HGB 16.5 15.0  HCT 45.5 41.5  MCV 95.4 98.1  PLT 202 173   Cardiac Enzymes:  Recent Labs Lab 12/20/13 0042  TROPONINI <0.30   BNP: BNP (last 3 results) No results found for this basename: PROBNP,  in the last 8760 hours CBG:  Recent Labs Lab 12/19/13 2356 12/20/13 0052 12/20/13 0156 12/20/13 0748 12/20/13 1142  GLUCAP 200* 168* 113* 256* 224*       Signed:  Marieann Zipp, PA-C [phone]  [facility] 12/20/2013, 12:02 PM

## 2013-12-20 NOTE — Progress Notes (Addendum)
Inpatient Diabetes Program Recommendations  AACE/ADA: New Consensus Statement on Inpatient Glycemic Control (2013)  Target Ranges:  Prepandial:   less than 140 mg/dL      Peak postprandial:   less than 180 mg/dL (1-2 hours)      Critically ill patients:  140 - 180 mg/dL   Results for KWESI, SANGHA (MRN [id number]) as of 12/20/2013 08:26  Ref. Range 12/20/2013 07:48  Glucose-Capillary Latest Range: 70-99 mg/dL 409 (H)    Reason for Assessment: elevated BG and A1C 13.9%  Diabetes history: Type 1 Outpatient Diabetes medications: none x 1 year Current orders for Inpatient glycemic control: Lantus 15 units QHS, Novolog 0-15 units QAC, Novolog 0-5 units QHS  Please consider increasing Lantus to 25 units QHS. (63kg x .4= 25)  Susette Racer, RN, [place], [place], CDE Diabetes Coordinator Inpatient Diabetes Program  [phone] (Team Pager) [phone] [facility]) 12/20/2013 8:27 AM    Spoke with patient.  Lost his job and his insurance and then couldn't afford medications, 1 1/2 years ago.  States he's been diagnosed with diabetes since age 23.  He recently got a meter and strips from an aunt and this prompted him to re-start checking his blood sugars and go to the [facility] because he identified high blood sugars. Patient already feels better; no pain in his hands, no tingling in his feet or legs, no blurred vision.  Patient receptive to information- plans on attending [facility] when he is discharged.   Susette Racer, RN, BA, MHA, CDE Diabetes Coordinator Inpatient Diabetes Program  [phone] (Team Pager) [phone] [facility]) 12/20/2013 9:16 AM

## 2013-12-20 NOTE — Progress Notes (Signed)
Marchelle Gearing to be D/C'd Home per MD order.  Discussed with the patient and all questions fully answered.    Medication List         insulin NPH-regular Human (70-30) 100 UNIT/ML injection  Commonly known as:  NOVOLIN 70/30  Inject 15 Units into the skin 2 (two) times daily with a meal.        VVS, Skin clean, dry and intact without evidence of skin break down, no evidence of skin tears noted. IV catheter discontinued intact. Site without signs and symptoms of complications. Dressing and pressure applied.  An After Visit Summary was printed and given to the patient.  D/c education completed with patient/family including follow up instructions, medication list, d/c activities limitations if indicated, with other d/c instructions as indicated by MD - patient able to verbalize understanding, all questions fully answered.   Patient instructed to return to ED, call 911, or call MD for any changes in condition.   Patient refused escort out. D/C home by private auto.    Aldean Ast 12/20/2013 12:41 PM

## 2013-12-20 NOTE — Care Management Note (Signed)
    Page 1 of 1   12/20/2013     12:43:15 PM CARE MANAGEMENT NOTE 12/20/2013  Patient:  Christopher Robles, Christopher Robles   Account Number:  [id number]  Date Initiated:  12/20/2013  Documentation initiated by:  Letha Cape  Subjective/Objective Assessment:   dx dka  admit- lives alone,pta indep.     Action/Plan:   Anticipated DC Date:  12/20/2013   Anticipated DC Plan:  HOME/SELF CARE      DC Planning Services  CM consult  Follow-up appt scheduled  [facility]      Choice offered to / List presented to:             Status of service:  Completed, signed off Medicare Important Message given?  NO (If response is "NO", the following Medicare IM given date fields will be blank) Date Medicare IM given:   Medicare IM given by:   Date Additional Medicare IM given:   Additional Medicare IM given by:    Discharge Disposition:  HOME/SELF CARE  Per UR Regulation:  Reviewed for med. necessity/level of care/duration of stay  If discussed at Long Length of Stay Meetings, dates discussed:    Comments:  12/20/13 1241 Letha Cape RN, BSN [phone] patient is for dc today, NCM informed patient to go to [facility] to get his insulin 70/30 wihich will be $10 and he can also get his syringes there as well per the [facility] pharmacy.  Patient already has a f/u apt on 9/3.

## 2013-12-22 ENCOUNTER — Ambulatory Visit: Payer: Self-pay | Attending: Internal Medicine

## 2013-12-29 ENCOUNTER — Ambulatory Visit: Payer: Self-pay | Attending: Internal Medicine | Admitting: Internal Medicine

## 2013-12-29 ENCOUNTER — Encounter: Payer: Self-pay | Admitting: Internal Medicine

## 2013-12-29 VITALS — BP 127/78 | HR 102 | Temp 98.4°F | Resp 16 | Wt 151.2 lb

## 2013-12-29 DIAGNOSIS — IMO0002 Reserved for concepts with insufficient information to code with codable children: Secondary | ICD-10-CM | POA: Insufficient documentation

## 2013-12-29 DIAGNOSIS — Z87898 Personal history of other specified conditions: Secondary | ICD-10-CM

## 2013-12-29 DIAGNOSIS — R6250 Unspecified lack of expected normal physiological development in childhood: Secondary | ICD-10-CM

## 2013-12-29 DIAGNOSIS — E1065 Type 1 diabetes mellitus with hyperglycemia: Secondary | ICD-10-CM | POA: Insufficient documentation

## 2013-12-29 DIAGNOSIS — E089 Diabetes mellitus due to underlying condition without complications: Secondary | ICD-10-CM

## 2013-12-29 DIAGNOSIS — F411 Generalized anxiety disorder: Secondary | ICD-10-CM | POA: Insufficient documentation

## 2013-12-29 DIAGNOSIS — F819 Developmental disorder of scholastic skills, unspecified: Secondary | ICD-10-CM

## 2013-12-29 DIAGNOSIS — R739 Hyperglycemia, unspecified: Secondary | ICD-10-CM

## 2013-12-29 DIAGNOSIS — E139 Other specified diabetes mellitus without complications: Secondary | ICD-10-CM

## 2013-12-29 DIAGNOSIS — G40909 Epilepsy, unspecified, not intractable, without status epilepticus: Secondary | ICD-10-CM | POA: Insufficient documentation

## 2013-12-29 DIAGNOSIS — Z8669 Personal history of other diseases of the nervous system and sense organs: Secondary | ICD-10-CM

## 2013-12-29 DIAGNOSIS — R413 Other amnesia: Secondary | ICD-10-CM

## 2013-12-29 DIAGNOSIS — Z794 Long term (current) use of insulin: Secondary | ICD-10-CM | POA: Insufficient documentation

## 2013-12-29 DIAGNOSIS — R7309 Other abnormal glucose: Secondary | ICD-10-CM

## 2013-12-29 LAB — GLUCOSE, POCT (MANUAL RESULT ENTRY)
POC Glucose: 431 mg/dl — AB (ref 70–99)
POC Glucose: 291 mg/dl — AB (ref 70–99)

## 2013-12-29 LAB — POCT URINALYSIS DIPSTICK
Bilirubin, UA: NEGATIVE
Blood, UA: NEGATIVE
Glucose, UA: 500
LEUKOCYTES UA: NEGATIVE
Nitrite, UA: NEGATIVE
PROTEIN UA: NEGATIVE
Spec Grav, UA: 1.01
UROBILINOGEN UA: 0.2
pH, UA: 5.5

## 2013-12-29 MED ORDER — SODIUM CHLORIDE 0.9 % IJ SOLN
999.0000 mL | Freq: Once | INTRAMUSCULAR | Status: AC
  Administered 2013-12-29: 999 mL via INTRAVENOUS

## 2013-12-29 MED ORDER — INSULIN ASPART 100 UNIT/ML ~~LOC~~ SOLN
15.0000 [IU] | Freq: Once | SUBCUTANEOUS | Status: AC
  Administered 2013-12-29: 15 [IU] via SUBCUTANEOUS

## 2013-12-29 MED ORDER — INSULIN NPH ISOPHANE & REGULAR (70-30) 100 UNIT/ML ~~LOC~~ SUSP: 17.0000 [IU] | Freq: Two times a day (BID) | SUBCUTANEOUS | Status: DC

## 2013-12-29 NOTE — Progress Notes (Signed)
MRN: [id number] Name: Christopher Robles  Sex: male Age: 23 y.o. DOB: 06/21/1990  Allergies: Review of patient's allergies indicates no known allergies.  Chief Complaint  Patient presents with  . Follow-up    HPI: Patient is 23 y.o. male who has history of diabetes seizure disorder anxiety, recently hospitalized with symptoms of polyuria polydipsia excessive thirst headache, his urine was positive for ketones and his A1c was 13.9% prior to that he was not taking his medication for almost one year because he could not afford the medication, he was treated for DKA, was started on Novolin 15 units twice a day, as per patient his fasting sugar is still more than 2 50 mg/dL, today his blood sugar is elevated but as per patient he has already eaten, his urine still shows some ketonuria but better than last visit, patient also reports improvement in his symptoms, he is accompanied with his family member and reported to have history of seizure disorders as well as memory deficits and  learning disability, family is requesting further evaluation. Patient used to see a neurologist in the past.  Past Medical History  Diagnosis Date  . DM I (diabetes mellitus, type I), uncontrolled   . Anxiety   . Seizure     Past Surgical History  Procedure Laterality Date  . Appendectomy        Medication List       This list is accurate as of: 12/29/13 11:34 AM.  Always use your most recent med list.               insulin NPH-regular Human (70-30) 100 UNIT/ML injection  Commonly known as:  NOVOLIN 70/30  Inject 17 Units into the skin 2 (two) times daily with a meal.        Meds ordered this encounter  Medications  . insulin NPH-regular Human (NOVOLIN 70/30) (70-30) 100 UNIT/ML injection    Sig: Inject 17 Units into the skin 2 (two) times daily with a meal.    Dispense:  10 mL    Refill:  3  . insulin aspart (novoLOG) injection 15 Units    Sig:      There is no immunization history on file  for this patient.  Family History  Problem Relation Age of Onset  . Cancer Maternal Aunt   . Cancer Maternal Uncle   . Heart disease Paternal Grandfather     History  Substance Use Topics  . Smoking status: Never Smoker   . Smokeless tobacco: Not on file  . Alcohol Use: No    Review of Systems   As noted in HPI  Filed Vitals:   12/29/13 1053  BP: 127/78  Pulse: 102  Temp: 98.4 F (36.9 C)  Resp: 16    Physical Exam  Physical Exam  Constitutional: No distress.  Eyes: EOM are normal. Pupils are equal, round, and reactive to light.  Cardiovascular: Normal rate and regular rhythm.   Pulmonary/Chest: Breath sounds normal. No respiratory distress. He has no wheezes. He has no rales.  Abdominal: Soft. There is no tenderness. There is no rebound.  Musculoskeletal: He exhibits no edema.    CBC    Component Value Date/Time   WBC 4.7 12/20/2013 0810   RBC 4.23 12/20/2013 0810   HGB 15.0 12/20/2013 0810   HCT 41.5 12/20/2013 0810   PLT 173 12/20/2013 0810   MCV 98.1 12/20/2013 0810   LYMPHSABS 2.0 08/09/2007 2026   MONOABS 0.7 08/09/2007 2026  EOSABS 0.1 08/09/2007 2026   BASOSABS 0.0 08/09/2007 2026    CMP     Component Value Date/Time   NA 136* 12/20/2013 0810   K 3.9 12/20/2013 0810   CL 99 12/20/2013 0810   CO2 19 12/20/2013 0810   GLUCOSE 260* 12/20/2013 0810   BUN 8 12/20/2013 0810   CREATININE 0.42* 12/20/2013 0810   CALCIUM 8.6 12/20/2013 0810   PROT 7.9 12/19/2013 1253   ALBUMIN 4.7 12/19/2013 1253   AST 21 12/19/2013 1253   ALT 14 12/19/2013 1253   ALKPHOS 119* 12/19/2013 1253   BILITOT 0.5 12/19/2013 1253   GFRNONAA >90 12/20/2013 0810   GFRAA >90 12/20/2013 0810    Lab Results  Component Value Date/Time   CHOL 158 08/09/2007  8:26 PM    No components found with this basename: hga1c    Lab Results  Component Value Date/Time   AST 21 12/19/2013 12:53 PM    Assessment and Plan  Diabetes mellitus due to underlying condition without complications - Plan:    Results for orders placed in visit on 12/29/13  POCT URINALYSIS DIPSTICK      Result Value Ref Range   Color, UA yellow     Clarity, UA clear     Glucose, UA 500     Bilirubin, UA neg     Ketones, UA      Spec Grav, UA 1.010     Blood, UA neg     pH, UA 5.5     Protein, UA neg     Urobilinogen, UA 0.2     Nitrite, UA neg     Leukocytes, UA Negative    GLUCOSE, POCT (MANUAL RESULT ENTRY)      Result Value Ref Range   POC Glucose 431 (*) 70 - 99 mg/dl  GLUCOSE, POCT (MANUAL RESULT ENTRY)      Result Value Ref Range   POC Glucose 291 (*) 70 - 99 mg/dl   Diabetes is still uncontrolled, patient was given IV fluid her and insulin his subsequent blood sugar came down to 291, he will increase the dose of Novolin to 70 units twice a day, continue with fingerstick log, he will come back in 2 weeks for nurse visit.  Urinalysis Dipstick, Glucose (CBG), insulin NPH-regular Human (NOVOLIN 70/30) (70-30) 100 UNIT/ML injection, insulin aspart (novoLOG) injection 15 Units  History of seizures/Memory changes/Learning difficulty - Plan: Patient is to follow with the neurologist in the past, another referral done. Ambulatory referral to Neurology   Return in about 3 months (around 03/30/2014) for diabetes, BCBG check in 2 weeks/Nurse Visit.  Doris Cheadle, MD

## 2013-12-29 NOTE — Progress Notes (Signed)
Patient here for follow up on his diabetes 

## 2013-12-29 NOTE — Progress Notes (Signed)
Patient requested to speak to LCSW because he needed documentation of his [facility] program.  Secretary/administrator who provided patient with copy of the letter.  Beverly Sessions MSW, LCSW

## 2014-01-09 ENCOUNTER — Other Ambulatory Visit: Payer: Self-pay

## 2014-01-09 DIAGNOSIS — E089 Diabetes mellitus due to underlying condition without complications: Secondary | ICD-10-CM

## 2014-01-09 MED ORDER — INSULIN NPH ISOPHANE & REGULAR (70-30) 100 UNIT/ML ~~LOC~~ SUSP: 17.0000 [IU] | Freq: Two times a day (BID) | SUBCUTANEOUS | Status: DC

## 2014-11-18 ENCOUNTER — Inpatient Hospital Stay (HOSPITAL_COMMUNITY): Payer: Self-pay

## 2014-11-18 ENCOUNTER — Encounter (HOSPITAL_COMMUNITY): Payer: Self-pay | Admitting: Emergency Medicine

## 2014-11-18 ENCOUNTER — Inpatient Hospital Stay (HOSPITAL_COMMUNITY)
Admission: EM | Admit: 2014-11-18 | Discharge: 2014-11-24 | DRG: 871 | Disposition: A | Discharge status: Home / Self Care | Payer: Self-pay | Attending: Internal Medicine | Admitting: Internal Medicine

## 2014-11-18 DIAGNOSIS — D696 Thrombocytopenia, unspecified: Secondary | ICD-10-CM | POA: Diagnosis present

## 2014-11-18 DIAGNOSIS — R569 Unspecified convulsions: Secondary | ICD-10-CM | POA: Diagnosis present

## 2014-11-18 DIAGNOSIS — K2980 Duodenitis without bleeding: Secondary | ICD-10-CM | POA: Diagnosis present

## 2014-11-18 DIAGNOSIS — R509 Fever, unspecified: Secondary | ICD-10-CM

## 2014-11-18 DIAGNOSIS — G92 Toxic encephalopathy: Secondary | ICD-10-CM | POA: Diagnosis present

## 2014-11-18 DIAGNOSIS — Z8249 Family history of ischemic heart disease and other diseases of the circulatory system: Secondary | ICD-10-CM

## 2014-11-18 DIAGNOSIS — F209 Schizophrenia, unspecified: Secondary | ICD-10-CM | POA: Diagnosis present

## 2014-11-18 DIAGNOSIS — Z79899 Other long term (current) drug therapy: Secondary | ICD-10-CM

## 2014-11-18 DIAGNOSIS — E1110 Type 2 diabetes mellitus with ketoacidosis without coma: Secondary | ICD-10-CM | POA: Diagnosis present

## 2014-11-18 DIAGNOSIS — D6959 Other secondary thrombocytopenia: Secondary | ICD-10-CM | POA: Diagnosis present

## 2014-11-18 DIAGNOSIS — D72829 Elevated white blood cell count, unspecified: Secondary | ICD-10-CM

## 2014-11-18 DIAGNOSIS — J90 Pleural effusion, not elsewhere classified: Secondary | ICD-10-CM | POA: Diagnosis present

## 2014-11-18 DIAGNOSIS — J189 Pneumonia, unspecified organism: Secondary | ICD-10-CM | POA: Diagnosis present

## 2014-11-18 DIAGNOSIS — F419 Anxiety disorder, unspecified: Secondary | ICD-10-CM | POA: Diagnosis present

## 2014-11-18 DIAGNOSIS — R0600 Dyspnea, unspecified: Secondary | ICD-10-CM

## 2014-11-18 DIAGNOSIS — K5900 Constipation, unspecified: Secondary | ICD-10-CM | POA: Diagnosis present

## 2014-11-18 DIAGNOSIS — E8889 Other specified metabolic disorders: Secondary | ICD-10-CM | POA: Diagnosis present

## 2014-11-18 DIAGNOSIS — Z809 Family history of malignant neoplasm, unspecified: Secondary | ICD-10-CM

## 2014-11-18 DIAGNOSIS — R109 Unspecified abdominal pain: Secondary | ICD-10-CM

## 2014-11-18 DIAGNOSIS — E1010 Type 1 diabetes mellitus with ketoacidosis without coma: Secondary | ICD-10-CM

## 2014-11-18 DIAGNOSIS — K529 Noninfective gastroenteritis and colitis, unspecified: Secondary | ICD-10-CM | POA: Diagnosis present

## 2014-11-18 DIAGNOSIS — N179 Acute kidney failure, unspecified: Secondary | ICD-10-CM

## 2014-11-18 DIAGNOSIS — F10939 Alcohol use, unspecified with withdrawal, unspecified: Secondary | ICD-10-CM

## 2014-11-18 DIAGNOSIS — F10239 Alcohol dependence with withdrawal, unspecified: Secondary | ICD-10-CM

## 2014-11-18 DIAGNOSIS — J9601 Acute respiratory failure with hypoxia: Secondary | ICD-10-CM | POA: Diagnosis present

## 2014-11-18 DIAGNOSIS — Z794 Long term (current) use of insulin: Secondary | ICD-10-CM

## 2014-11-18 DIAGNOSIS — E860 Dehydration: Secondary | ICD-10-CM

## 2014-11-18 DIAGNOSIS — J9811 Atelectasis: Secondary | ICD-10-CM | POA: Diagnosis present

## 2014-11-18 DIAGNOSIS — E876 Hypokalemia: Secondary | ICD-10-CM | POA: Diagnosis present

## 2014-11-18 DIAGNOSIS — E861 Hypovolemia: Secondary | ICD-10-CM | POA: Diagnosis present

## 2014-11-18 DIAGNOSIS — B3781 Candidal esophagitis: Secondary | ICD-10-CM | POA: Diagnosis present

## 2014-11-18 DIAGNOSIS — B370 Candidal stomatitis: Secondary | ICD-10-CM | POA: Diagnosis present

## 2014-11-18 DIAGNOSIS — A419 Sepsis, unspecified organism: Principal | ICD-10-CM | POA: Diagnosis present

## 2014-11-18 DIAGNOSIS — F10231 Alcohol dependence with withdrawal delirium: Secondary | ICD-10-CM | POA: Diagnosis present

## 2014-11-18 DIAGNOSIS — E871 Hypo-osmolality and hyponatremia: Secondary | ICD-10-CM | POA: Diagnosis present

## 2014-11-18 HISTORY — DX: Alcohol abuse, uncomplicated: F10.10

## 2014-11-18 LAB — BASIC METABOLIC PANEL
Anion gap: 20 — ABNORMAL HIGH (ref 5–15)
BUN: 13 mg/dL (ref 6–20)
BUN: 13 mg/dL (ref 6–20)
BUN: 13 mg/dL (ref 6–20)
BUN: 13 mg/dL (ref 6–20)
BUN: 14 mg/dL (ref 6–20)
CALCIUM: 8.5 mg/dL — AB (ref 8.9–10.3)
CALCIUM: 8.9 mg/dL (ref 8.9–10.3)
CALCIUM: 9.1 mg/dL (ref 8.9–10.3)
CO2: <5 mmol/L — ABNORMAL LOW (ref 22–32)
CO2: UNDETERMINED mmol/L (ref 22–32)
CO2: 5 mmol/L — ABNORMAL LOW (ref 22–32)
CREATININE: 1.29 mg/dL — AB (ref 0.61–1.24)
CREATININE: 1.22 mg/dL (ref 0.61–1.24)
CREATININE: 1.52 mg/dL — AB (ref 0.61–1.24)
Calcium: 9.1 mg/dL (ref 8.9–10.3)
Calcium: 8.1 mg/dL — ABNORMAL LOW (ref 8.9–10.3)
Chloride: 91 mmol/L — ABNORMAL LOW (ref 101–111)
Chloride: 95 mmol/L — ABNORMAL LOW (ref 101–111)
Chloride: 107 mmol/L (ref 101–111)
Chloride: 103 mmol/L (ref 101–111)
Chloride: 108 mmol/L (ref 101–111)
Creatinine, Ser: 1.61 mg/dL — ABNORMAL HIGH (ref 0.61–1.24)
Creatinine, Ser: 1.73 mg/dL — ABNORMAL HIGH (ref 0.61–1.24)
GFR calc Af Amer: >60 mL/min (ref >60)
GFR calc Af Amer: >60 mL/min (ref >60)
GFR calc Af Amer: >60 mL/min (ref >60)
GFR calc non Af Amer: >60 mL/min (ref >60)
GFR calc non Af Amer: >60 mL/min (ref >60)
GFR calc non Af Amer: >60 mL/min (ref >60)
GFR calc non Af Amer: 54 mL/min — ABNORMAL LOW (ref >60)
GFR, EST NON AFRICAN AMERICAN: 59 mL/min — AB (ref >60)
GLUCOSE: 310 mg/dL — AB (ref 65–99)
GLUCOSE: 173 mg/dL — AB (ref 65–99)
GLUCOSE: 160 mg/dL — AB (ref 65–99)
Glucose, Bld: 535 mg/dL — ABNORMAL HIGH (ref 65–99)
Glucose, Bld: 171 mg/dL — ABNORMAL HIGH (ref 65–99)
POTASSIUM: 3.3 mmol/L — AB (ref 3.5–5.1)
Potassium: 4.5 mmol/L (ref 3.5–5.1)
Potassium: 4.1 mmol/L (ref 3.5–5.1)
Potassium: 4.7 mmol/L (ref 3.5–5.1)
Potassium: 3.4 mmol/L — ABNORMAL LOW (ref 3.5–5.1)
SODIUM: 128 mmol/L — AB (ref 135–145)
SODIUM: 129 mmol/L — AB (ref 135–145)
Sodium: 125 mmol/L — ABNORMAL LOW (ref 135–145)
Sodium: 130 mmol/L — ABNORMAL LOW (ref 135–145)
Sodium: 133 mmol/L — ABNORMAL LOW (ref 135–145)

## 2014-11-18 LAB — URINE MICROSCOPIC-ADD ON

## 2014-11-18 LAB — GLUCOSE, CAPILLARY
GLUCOSE-CAPILLARY: 279 mg/dL — AB (ref 65–99)
GLUCOSE-CAPILLARY: 199 mg/dL — AB (ref 65–99)
GLUCOSE-CAPILLARY: 190 mg/dL — AB (ref 65–99)
GLUCOSE-CAPILLARY: 160 mg/dL — AB (ref 65–99)
GLUCOSE-CAPILLARY: 183 mg/dL — AB (ref 65–99)
GLUCOSE-CAPILLARY: 176 mg/dL — AB (ref 65–99)
GLUCOSE-CAPILLARY: 189 mg/dL — AB (ref 65–99)
GLUCOSE-CAPILLARY: 178 mg/dL — AB (ref 65–99)

## 2014-11-18 LAB — BLOOD GAS, ARTERIAL
Acid-base deficit: 25.4 mmol/L — ABNORMAL HIGH (ref 0.0–2.0)
Acid-base deficit: 22.4 mmol/L — ABNORMAL HIGH (ref 0.0–2.0)
BICARBONATE: 4.3 meq/L — AB (ref 20.0–24.0)
Bicarbonate: 3.6 mEq/L — ABNORMAL LOW (ref 20.0–24.0)
Drawn by: 331471
Drawn by: 365271
FIO2: 0.21 %
O2 Saturation: 98 %
O2 Saturation: 98.5 %
PATIENT TEMPERATURE: 98.6
PCO2 ART: 11.3 mmHg — AB (ref 35.0–45.0)
PH ART: 7.217 — AB (ref 7.350–7.450)
PO2 ART: 112 mmHg — AB (ref 80.0–100.0)
PO2 ART: 120 mmHg — AB (ref 80.0–100.0)
Patient temperature: 101.3
TCO2: 3.4 mmol/L (ref 0–100)
TCO2: 3.9 mmol/L (ref 0–100)
pCO2 arterial: 11.1 mmHg — CL (ref 35.0–45.0)
pH, Arterial: 7.139 — CL (ref 7.350–7.450)

## 2014-11-18 LAB — CBC WITH DIFFERENTIAL/PLATELET
BASOS ABS: 0 10*3/uL (ref 0.0–0.1)
BASOS PCT: 0 % (ref 0–1)
EOS ABS: 0 10*3/uL (ref 0.0–0.7)
Eosinophils Relative: 0 % (ref 0–5)
HEMATOCRIT: 46 % (ref 39.0–52.0)
HEMOGLOBIN: 16.5 g/dL (ref 13.0–17.0)
Lymphocytes Relative: 16 % (ref 12–46)
Lymphs Abs: 3.5 10*3/uL (ref 0.7–4.0)
MCH: 36.7 pg — AB (ref 26.0–34.0)
MCHC: 35.9 g/dL (ref 30.0–36.0)
MCV: 102.2 fL — ABNORMAL HIGH (ref 78.0–100.0)
Monocytes Absolute: 1.1 10*3/uL — ABNORMAL HIGH (ref 0.1–1.0)
Monocytes Relative: 5 % (ref 3–12)
NEUTROS PCT: 79 % — AB (ref 43–77)
Neutro Abs: 17 10*3/uL — ABNORMAL HIGH (ref 1.7–7.7)
Platelets: 232 10*3/uL (ref 150–400)
RBC: 4.5 MIL/uL (ref 4.22–5.81)
RDW: 13 % (ref 11.5–15.5)
WBC: 21.6 10*3/uL — ABNORMAL HIGH (ref 4.0–10.5)

## 2014-11-18 LAB — CBG MONITORING, ED
GLUCOSE-CAPILLARY: 558 mg/dL — AB (ref 65–99)
GLUCOSE-CAPILLARY: 476 mg/dL — AB (ref 65–99)
Glucose-Capillary: 432 mg/dL — ABNORMAL HIGH (ref 65–99)
Glucose-Capillary: 369 mg/dL — ABNORMAL HIGH (ref 65–99)

## 2014-11-18 LAB — URINALYSIS, ROUTINE W REFLEX MICROSCOPIC
Glucose, UA: >1000 mg/dL — AB
Leukocytes, UA: NEGATIVE
Nitrite: NEGATIVE
PH: 5.5 (ref 5.0–8.0)
Protein, ur: 100 mg/dL — AB
SPECIFIC GRAVITY, URINE: 1.018 (ref 1.005–1.030)
Urobilinogen, UA: 0.2 mg/dL (ref 0.0–1.0)

## 2014-11-18 LAB — LACTIC ACID, PLASMA
LACTIC ACID, VENOUS: 1.5 mmol/L (ref 0.5–2.0)
Lactic Acid, Venous: <0.3 mmol/L — ABNORMAL LOW (ref 0.5–2.0)

## 2014-11-18 LAB — RAPID URINE DRUG SCREEN, HOSP PERFORMED
Amphetamines: NOT DETECTED
Barbiturates: NOT DETECTED
Benzodiazepines: NOT DETECTED
Cocaine: NOT DETECTED
OPIATES: NOT DETECTED
Tetrahydrocannabinol: NOT DETECTED

## 2014-11-18 LAB — MRSA PCR SCREENING: MRSA BY PCR: POSITIVE — AB

## 2014-11-18 LAB — PROCALCITONIN: PROCALCITONIN: 3.75 ng/mL

## 2014-11-18 LAB — ETHANOL

## 2014-11-18 MED ORDER — VANCOMYCIN HCL IN DEXTROSE 750-5 MG/150ML-% IV SOLN
750.0000 mg | Freq: Two times a day (BID) | INTRAVENOUS | Status: DC
  Administered 2014-11-19 – 2014-11-20 (×5): 750 mg via INTRAVENOUS
  Filled 2014-11-18 (×7): qty 150

## 2014-11-18 MED ORDER — VITAMIN B-1 100 MG PO TABS: 100.0000 mg | ORAL_TABLET | Freq: Every day | ORAL | Status: DC

## 2014-11-18 MED ORDER — LORAZEPAM 2 MG/ML IJ SOLN
3.0000 mg | Freq: Once | INTRAMUSCULAR | Status: AC
  Administered 2014-11-18: 3 mg via INTRAVENOUS

## 2014-11-18 MED ORDER — SODIUM CHLORIDE 0.9 % IV SOLN
INTRAVENOUS | Status: DC
  Administered 2014-11-18: 75 mL/h via INTRAVENOUS

## 2014-11-18 MED ORDER — ONDANSETRON HCL 4 MG/2ML IJ SOLN: 4.0000 mg | Freq: Four times a day (QID) | INTRAMUSCULAR | Status: DC

## 2014-11-18 MED ORDER — PIPERACILLIN-TAZOBACTAM 3.375 G IVPB: 3.3750 g | Freq: Three times a day (TID) | INTRAVENOUS | Status: DC

## 2014-11-18 MED ORDER — LORAZEPAM 2 MG/ML IJ SOLN
2.0000 mg | INTRAMUSCULAR | Status: DC | PRN
  Administered 2014-11-18 – 2014-11-20 (×3): 2 mg via INTRAVENOUS
  Filled 2014-11-18 (×3): qty 1

## 2014-11-18 MED ORDER — INSULIN REGULAR HUMAN 100 UNIT/ML IJ SOLN
INTRAMUSCULAR | Status: DC
  Administered 2014-11-19: 22:00:00 via INTRAVENOUS
  Filled 2014-11-18 (×2): qty 2.5

## 2014-11-18 MED ORDER — THIAMINE HCL 100 MG/ML IJ SOLN
100.0000 mg | Freq: Every day | INTRAMUSCULAR | Status: DC
  Administered 2014-11-18 – 2014-11-20 (×3): 100 mg via INTRAVENOUS
  Filled 2014-11-18 (×4): qty 2

## 2014-11-18 MED ORDER — LORAZEPAM 1 MG PO TABS: 1.0000 mg | ORAL_TABLET | Freq: Four times a day (QID) | ORAL | Status: DC | PRN

## 2014-11-18 MED ORDER — VANCOMYCIN HCL IN DEXTROSE 1-5 GM/200ML-% IV SOLN
1000.0000 mg | Freq: Once | INTRAVENOUS | Status: AC
  Administered 2014-11-18: 1000 mg via INTRAVENOUS
  Filled 2014-11-18: qty 200

## 2014-11-18 MED ORDER — VITAMIN B-1 100 MG PO TABS
100.0000 mg | ORAL_TABLET | Freq: Every day | ORAL | Status: DC
  Administered 2014-11-21 – 2014-11-24 (×4): 100 mg via ORAL
  Filled 2014-11-18 (×4): qty 1

## 2014-11-18 MED ORDER — SODIUM CHLORIDE 0.9 % IV SOLN
INTRAVENOUS | Status: AC
  Administered 2014-11-18: 999 mL/h via INTRAVENOUS

## 2014-11-18 MED ORDER — VANCOMYCIN HCL IN DEXTROSE 1-5 GM/200ML-% IV SOLN: 1000.0000 mg | Freq: Once | INTRAVENOUS | Status: DC

## 2014-11-18 MED ORDER — CETYLPYRIDINIUM CHLORIDE 0.05 % MT LIQD
7.0000 mL | Freq: Two times a day (BID) | OROMUCOSAL | Status: DC
  Administered 2014-11-18 – 2014-11-22 (×10): 7 mL via OROMUCOSAL

## 2014-11-18 MED ORDER — POTASSIUM CHLORIDE 10 MEQ/100ML IV SOLN
10.0000 meq | INTRAVENOUS | Status: AC
  Administered 2014-11-18 (×2): 10 meq via INTRAVENOUS
  Filled 2014-11-18 (×2): qty 100

## 2014-11-18 MED ORDER — ONDANSETRON HCL 4 MG/2ML IJ SOLN
4.0000 mg | Freq: Four times a day (QID) | INTRAMUSCULAR | Status: DC | PRN
  Administered 2014-11-18 – 2014-11-21 (×4): 4 mg via INTRAVENOUS
  Filled 2014-11-18 (×4): qty 2

## 2014-11-18 MED ORDER — LORAZEPAM 2 MG/ML IJ SOLN
1.0000 mg | Freq: Once | INTRAMUSCULAR | Status: AC
  Administered 2014-11-18: 1 mg via INTRAVENOUS
  Filled 2014-11-18: qty 1

## 2014-11-18 MED ORDER — LORAZEPAM 2 MG/ML IJ SOLN
1.0000 mg | Freq: Four times a day (QID) | INTRAMUSCULAR | Status: DC | PRN
  Administered 2014-11-18: 1 mg via INTRAVENOUS
  Filled 2014-11-18: qty 1

## 2014-11-18 MED ORDER — PIPERACILLIN-TAZOBACTAM 3.375 G IVPB
3.3750 g | Freq: Three times a day (TID) | INTRAVENOUS | Status: DC
  Administered 2014-11-18: 3.375 g via INTRAVENOUS
  Filled 2014-11-18: qty 50

## 2014-11-18 MED ORDER — PIPERACILLIN-TAZOBACTAM 3.375 G IVPB 30 MIN: 3.3750 g | Freq: Once | INTRAVENOUS | Status: DC

## 2014-11-18 MED ORDER — MUPIROCIN 2 % EX OINT
1.0000 "application " | TOPICAL_OINTMENT | Freq: Two times a day (BID) | CUTANEOUS | Status: AC
  Administered 2014-11-18 – 2014-11-22 (×9): 1 via NASAL
  Filled 2014-11-18 (×3): qty 22

## 2014-11-18 MED ORDER — FOLIC ACID 1 MG PO TABS
1.0000 mg | ORAL_TABLET | Freq: Every day | ORAL | Status: DC
  Administered 2014-11-20 – 2014-11-24 (×5): 1 mg via ORAL
  Filled 2014-11-18 (×5): qty 1

## 2014-11-18 MED ORDER — VITAMINS A & D EX OINT
TOPICAL_OINTMENT | CUTANEOUS | Status: AC
  Administered 2014-11-18: 1
  Filled 2014-11-18: qty 5

## 2014-11-18 MED ORDER — CHLORHEXIDINE GLUCONATE CLOTH 2 % EX PADS: 6.0000 | MEDICATED_PAD | Freq: Every day | CUTANEOUS | Status: DC

## 2014-11-18 MED ORDER — SODIUM CHLORIDE 0.9 % IV BOLUS (SEPSIS)
1000.0000 mL | Freq: Once | INTRAVENOUS | Status: AC
  Administered 2014-11-18: 1000 mL via INTRAVENOUS

## 2014-11-18 MED ORDER — VITAMINS A & D EX OINT
TOPICAL_OINTMENT | CUTANEOUS | Status: AC
  Administered 2014-11-18: 22:00:00
  Filled 2014-11-18: qty 5

## 2014-11-18 MED ORDER — THIAMINE HCL 100 MG/ML IJ SOLN: 100.0000 mg | Freq: Every day | INTRAMUSCULAR | Status: DC

## 2014-11-18 MED ORDER — ADULT MULTIVITAMIN W/MINERALS CH
1.0000 | ORAL_TABLET | Freq: Every day | ORAL | Status: DC
  Administered 2014-11-20 – 2014-11-24 (×5): 1 via ORAL
  Filled 2014-11-18 (×5): qty 1

## 2014-11-18 MED ORDER — DEXMEDETOMIDINE HCL IN NACL 200 MCG/50ML IV SOLN
0.4000 ug/kg/h | INTRAVENOUS | Status: DC
  Administered 2014-11-18: 0.4 ug/kg/h via INTRAVENOUS
  Administered 2014-11-19: 0.3 ug/kg/h via INTRAVENOUS
  Administered 2014-11-19: 0.5 ug/kg/h via INTRAVENOUS
  Administered 2014-11-20: 0.2 ug/kg/h via INTRAVENOUS
  Filled 2014-11-18 (×5): qty 50

## 2014-11-18 MED ORDER — LORAZEPAM 2 MG/ML IJ SOLN
0.0000 mg | Freq: Four times a day (QID) | INTRAMUSCULAR | Status: DC
  Administered 2014-11-18: 1 mg via INTRAVENOUS
  Filled 2014-11-18: qty 1

## 2014-11-18 MED ORDER — LACTATED RINGERS IV BOLUS (SEPSIS)
2000.0000 mL | Freq: Once | INTRAVENOUS | Status: AC
  Administered 2014-11-18: 2000 mL via INTRAVENOUS

## 2014-11-18 MED ORDER — LORAZEPAM 2 MG/ML IJ SOLN
2.0000 mg | INTRAMUSCULAR | Status: DC | PRN
Start: 2014-11-18 — End: 2014-11-18

## 2014-11-18 MED ORDER — DEXTROSE-NACL 5-0.45 % IV SOLN
INTRAVENOUS | Status: DC
  Administered 2014-11-18: 75 mL/h via INTRAVENOUS

## 2014-11-18 MED ORDER — LORAZEPAM 1 MG PO TABS: 0.0000 mg | ORAL_TABLET | Freq: Four times a day (QID) | ORAL | Status: DC

## 2014-11-18 MED ORDER — SODIUM CHLORIDE 0.9 % IV SOLN
INTRAVENOUS | Status: DC
  Administered 2014-11-18: 9.3 [IU]/h via INTRAVENOUS
  Administered 2014-11-18: 4.2 [IU]/h via INTRAVENOUS
  Filled 2014-11-18: qty 2.5

## 2014-11-18 MED ORDER — VANCOMYCIN HCL IN DEXTROSE 750-5 MG/150ML-% IV SOLN
750.0000 mg | Freq: Two times a day (BID) | INTRAVENOUS | Status: DC
  Filled 2014-11-18: qty 150

## 2014-11-18 MED ORDER — LORAZEPAM 1 MG PO TABS: 0.0000 mg | ORAL_TABLET | Freq: Two times a day (BID) | ORAL | Status: DC

## 2014-11-18 MED ORDER — CHLORHEXIDINE GLUCONATE 0.12 % MT SOLN
15.0000 mL | Freq: Two times a day (BID) | OROMUCOSAL | Status: DC
  Administered 2014-11-18 – 2014-11-24 (×12): 15 mL via OROMUCOSAL
  Filled 2014-11-18 (×11): qty 15

## 2014-11-18 MED ORDER — PIPERACILLIN-TAZOBACTAM 3.375 G IVPB
3.3750 g | Freq: Three times a day (TID) | INTRAVENOUS | Status: DC
  Administered 2014-11-18 – 2014-11-24 (×17): 3.375 g via INTRAVENOUS
  Filled 2014-11-18 (×18): qty 50

## 2014-11-18 MED ORDER — LORAZEPAM 2 MG/ML IJ SOLN
INTRAMUSCULAR | Status: AC
  Filled 2014-11-18: qty 2

## 2014-11-18 MED ORDER — LORAZEPAM 2 MG/ML IJ SOLN
0.0000 mg | Freq: Two times a day (BID) | INTRAMUSCULAR | Status: DC
Start: 2014-11-18 — End: 2014-11-18

## 2014-11-18 NOTE — ED Provider Notes (Signed)
CSN: [id number]     Arrival date & time 11/18/14  0424 History   First MD Initiated Contact with Patient 11/18/14 [phone]     Chief Complaint  Patient presents with  . Shortness of Breath   LEVEL 5 CAVEAT DUE TO ACUITY OF CONDITION   Patient is a 24 y.o. male presenting with shortness of breath. The history is provided by the patient. The history is limited by the condition of the patient.  Shortness of Breath Severity:  Severe Onset quality:  Gradual Duration:  1 day Timing:  Constant Progression:  Worsening Chronicity:  New Relieved by:  Nothing Worsened by:  Nothing tried Associated symptoms: vomiting   Associated symptoms: no abdominal pain   pt reports h/o diabetes He is not compliant with medications For past day he has felt SOB and also had nausea/vomiting He has mild chest tightness He admits to ETOH abuse, and he stopped 3 days ago   Past Medical History  Diagnosis Date  . DM I (diabetes mellitus, type I), uncontrolled   . Anxiety   . Seizure    Past Surgical History  Procedure Laterality Date  . Appendectomy     Family History  Problem Relation Age of Onset  . Cancer Maternal Aunt   . Cancer Maternal Uncle   . Heart disease Paternal Grandfather    History  Substance Use Topics  . Smoking status: Never Smoker   . Smokeless tobacco: Not on file  . Alcohol Use: No    Review of Systems  Unable to perform ROS: Acuity of condition  Respiratory: Positive for shortness of breath.   Gastrointestinal: Positive for vomiting. Negative for abdominal pain.      Allergies  Review of patient's allergies indicates no known allergies.  Home Medications   Prior to Admission medications   Medication Sig Start Date End Date Taking? Authorizing Provider  insulin NPH-regular Human (NOVOLIN 70/30) (70-30) 100 UNIT/ML injection Inject 17 Units into the skin 2 (two) times daily with a meal. 01/09/14   Deepak Advani, MD   BP 148/95 mmHg  Pulse 126  Temp(Src) 97 F  (36.1 C) (Rectal)  Resp 35  SpO2 100% Physical Exam CONSTITUTIONAL: ill appearing HEAD: Normocephalic/atraumatic EYES: EOMI/PERRL ENMT: Mucous membranes dry NECK: supple no meningeal signs SPINE/BACK:entire spine nontender CV: S1/S2 noted, no murmurs/rubs/gallops noted LUNGS: tachypneic, lungs are clear ABDOMEN: soft, nontender, no rebound or guarding, bowel sounds noted throughout abdomen GU:no cva tenderness NEURO: Pt is awake/alert/appropriate, moves all extremitiesx4.  No facial droop.   EXTREMITIES: pulses normal/equal, full ROM SKIN: warm, color normal PSYCH: anxious  ED Course  Procedures  CRITICAL CARE Performed by: Joya Gaskins Total critical care time: 50 Critical care time was exclusive of separately billable procedures and treating other patients. Critical care was necessary to treat or prevent imminent or life-threatening deterioration. Critical care was time spent personally by me on the following activities: development of treatment plan with patient and/or surrogate as well as nursing, discussions with consultants, evaluation of patient's response to treatment, examination of patient, obtaining history from patient or surrogate, ordering and performing treatments and interventions, ordering and review of laboratory studies, ordering and review of radiographic studies, pulse oximetry and re-evaluation of patient's condition. PATIENT IS IN DKA, HE HAS PH OF 6.9, HE IS DEHYDRATED AND REQUIRED IV FLUIDS AND IV INSULIN  Labs Review Labs Reviewed  CBC WITH DIFFERENTIAL/PLATELET - Abnormal; Notable for the following:    WBC 21.6 (*)    MCV 102.2 (*)  MCH 36.7 (*)    Neutrophils Relative % 79 (*)    Neutro Abs 17.0 (*)    Monocytes Absolute 1.1 (*)    All other components within normal limits  BLOOD GAS, VENOUS - Abnormal; Notable for the following:    pH, Ven 6.925 (*)    pCO2, Ven 18.2 (*)    Bicarbonate 3.6 (*)    All other components within normal limits   CBG MONITORING, ED - Abnormal; Notable for the following:    Glucose-Capillary 558 (*)    All other components within normal limits  CBG MONITORING, ED - Abnormal; Notable for the following:    Glucose-Capillary 476 (*)    All other components within normal limits  CBG MONITORING, ED - Abnormal; Notable for the following:    Glucose-Capillary 432 (*)    All other components within normal limits  BASIC METABOLIC PANEL      EKG Interpretation   Date/Time:  Saturday November 18 2014 05:01:43 EDT Ventricular Rate:  128 PR Interval:  137 QRS Duration: 79 QT Interval:  306 QTC Calculation: 446 R Axis:   59 Text Interpretation:  Sinus tachycardia artifact noted Nonspecific repol  abnormality, inferior leads Abnormal ekg Confirmed by Bebe Shaggy  MD, Jeffrey Voth  [phone]) on 11/18/2014 5:13:42 AM     4:59 AM Pt appears in likely DKA Fluids ordered Ativan ordered as high risk for ETOH withdrawal due to ETOH abuse history 7:21 AM PT REPORTS IMPROVEMENT BUT STILL TACHYPNEIC HE IS IN DKA WILL NEED ADMIT   D/w dr Elisabeth Pigeon will admit to stepdown   Medications  insulin regular (NOVOLIN R,HUMULIN R) 250 Units in sodium chloride 0.9 % 250 mL (1 Units/mL) infusion (7.4 Units/hr Intravenous Rate/Dose Change 11/18/14 0711)  LORazepam (ATIVAN) tablet 0-4 mg (not administered)  LORazepam (ATIVAN) tablet 0-4 mg (not administered)  LORazepam (ATIVAN) injection 0-4 mg (1 mg Intravenous Given 11/18/14 0728)  LORazepam (ATIVAN) injection 0-4 mg (not administered)  thiamine (VITAMIN B-1) tablet 100 mg (not administered)  thiamine (B-1) injection 100 mg (not administered)  sodium chloride 0.9 % bolus 1,000 mL (0 mLs Intravenous Stopped 11/18/14 0612)  LORazepam (ATIVAN) injection 1 mg (1 mg Intravenous Given 11/18/14 0531)  sodium chloride 0.9 % bolus 1,000 mL (0 mLs Intravenous Stopped 11/18/14 0612)    MDM   Final diagnoses:  Diabetic ketoacidosis without coma associated with type 1 diabetes mellitus   Dehydration  Alcohol withdrawal, with unspecified complication    Nursing notes including past medical history and social history reviewed and considered in documentation Labs/vital reviewed myself and considered during evaluation     Zadie Rhine, MD 11/18/14 [phone]

## 2014-11-18 NOTE — ED Notes (Signed)
Notified RN,[place] pt. CBG 476.

## 2014-11-18 NOTE — Progress Notes (Signed)
ANTIBIOTIC CONSULT NOTE - INITIAL  Pharmacy Consult for Vancomycin, Zosyn Indication: rule out sepsis  No Known Allergies  Patient Measurements:    Last known weight: 68.6 kg (12/29/13)  Vital Signs: Temp: 97 F (36.1 C) (07/23 0518) Temp Source: Rectal (07/23 0518) BP: 143/93 mmHg (07/23 0815) Pulse Rate: 120 (07/23 0745) Intake/Output from previous day:   Intake/Output from this shift:    Labs:  Recent Labs  11/18/14 0524  WBC 21.6*  HGB 16.5  PLT 232  CREATININE 1.61*   CrCl cannot be calculated (Unknown ideal weight.). No results for input(s): VANCOTROUGH, VANCOPEAK, VANCORANDOM, GENTTROUGH, GENTPEAK, GENTRANDOM, TOBRATROUGH, TOBRAPEAK, TOBRARND, AMIKACINPEAK, AMIKACINTROU, AMIKACIN in the last 72 hours.   Microbiology: No results found for this or any previous visit (from the past 720 hour(s)).  Medical History: Past Medical History  Diagnosis Date  . DM I (diabetes mellitus, type I), uncontrolled   . Anxiety   . Seizure     Medications:  Anti-infectives    Start     Dose/Rate Route Frequency Ordered Stop   11/18/14 0845  piperacillin-tazobactam (ZOSYN) IVPB 3.375 g     3.375 g 100 mL/hr over 30 Minutes Intravenous  Once 11/18/14 0839     11/18/14 0845  vancomycin (VANCOCIN) IVPB 1000 mg/200 mL premix     1,000 mg 200 mL/hr over 60 Minutes Intravenous  Once 11/18/14 6063       Assessment: 23 yoM presents to ED on 7/23 with SOB and noted to be in DKA.  Pharmacy is consulted to dose vancomycin and Zosyn for suspected sepsis.  7/23 >> Vanc >> 7/23 >> Zosyn >>    Today, 11/18/2014:  Afebrile  WBC: 21.6  Renal: SCr 1.61 with CrCl ~ 68 ml/min  Blood cultures ordered.  Goal of Therapy:  Vancomycin trough level 15-20 mcg/ml Appropriate abx dosing, eradication of infection.  Plan:   Zosyn 3.375g IV Q8H infused over 4hrs.   Vancomycin 1g IV once, then  IV q12h.  Measure Vanc trough at steady state.  Follow up renal fxn, culture  results, and clinical course.  Lynann Beaver PharmD, BCPS Pager [phone] 11/18/2014 8:46 AM

## 2014-11-18 NOTE — ED Notes (Signed)
Bed: ZO10 Expected date:  Expected time:  Means of arrival:  Comments: Ems out of insulin BS426, RR 40 HR 140

## 2014-11-18 NOTE — H&P (Signed)
[facility] History and Physical  Christopher Robles IRC:[id number] DOB: Mar 07, 1991 DOA: 11/18/2014  Referring physician: ER physician: Dr. Ripley Fraise PCP: Lorayne Marek, MD  Chief Complaint: altered mental status   HPI:  24 year old male with past medical history of diabetes mellitus who presented to [facility] ED with altered mental status. He is not a good historian at this time due to his AMS, hallucinations and respiratory distress. Per ED report patient has had worsening shortness of breath over past 24 hours but no associated fevers or chills or cough. No abdominal pain, nausea or vomiting. His last alcoholic beverage was about few days prior to this admission.   In ED, BP was 124/57, HR was 124, RR 27-35, oxygen saturation was 89-100% on room air. Blood work showed WBC count 21.6, sodium 128, creatinine 1.61. His UDS and alcohol level were WNL. ABG showed pO2 of 38. He was profound;y short of breath. Order placed for CXR which demonstrated no acute findings. I consulted PCCM who recommended close observation but no indication for intubation at this time. He was placed on empiric vanco and zosyn for sepsis due to possible pneumonia. In addition, his CBG was 558 with CO2 less than 5 but UA not obtained in ED to evaluate for presence of ketones. He was however started on insulin drip.   Assessment & Plan    Principal Problem: Acute encephalopathy / Acute alcohol withdrawal /delirium - Likely withdrawal delirium - Alcohol level WNL - On CIWa protocol - Continue to monitor in SDU - Continue supportive care with IV fluids   Active Problems: DKA (diabetic ketoacidoses) / Diabetes mellitus, uncontrolled - Check UA for presence of ketones - CBG's in 500 range on admission; improved after he was started on insulin drip - Check A1c - Continue IV fluids per DKA protocol, check BMP and CBG per DKA protocol  Sepsis due to possible pneumonia / Leukocytosis / acute respiratory failure with  hypoxia  - Sepsis criteria met on admission considering hypoxia, tachycardia, tachypnea, leukocytosis. Follow up CXR  - Sepsis work up initiated - Follow up blood cultures, resp cultures, lactic acid, procalcitonin level - Pneumonia ord er set place - Follow up legionella and strep pneumonia results.   Hyponatremia - Likely from alcohol abuse - Continue IV fluids - Follow up BMP tomorrow am   Acute renal failure - Likely from alcohol abuse, sepsis - Cr resolved to WNL with IV fluids   DVT prophylaxis:  - SCD's bilaterally   Radiological Exams on Admission: None; CXR pending   EKG: I have personally reviewed EKG. EKG shows sinus tachycardia   Code Status: Full Family Communication: Plan of care discussed with the patient  Disposition Plan: Admit for further evaluation  Leisa Lenz, MD  [facility] Pager [phone]  Time spent in minutes: 75 minutes  Review of Systems:  Unable to obtain due to patient's altered mental status.   Past Medical History  Diagnosis Date  . DM I (diabetes mellitus, type I), uncontrolled   . Anxiety   . Seizure    Past Surgical History  Procedure Laterality Date  . Appendectomy     Social History:  reports that he has never smoked. He does not have any smokeless tobacco history on file. He reports that he does not drink alcohol or use illicit drugs.  No Known Allergies  Family History:  Family History  Problem Relation Age of Onset  . Cancer Maternal Aunt   . Cancer Maternal Uncle   .  Heart disease Paternal Grandfather      Prior to Admission medications   Medication Sig Start Date End Date Taking? Authorizing Provider  acetaminophen (TYLENOL) 500 MG tablet Take 1,000-2,000 mg by mouth every 6 (six) hours as needed (for pain.).    Yes Historical Provider, MD  insulin NPH-regular Human (NOVOLIN 70/30) (70-30) 100 UNIT/ML injection Inject 17 Units into the skin 2 (two) times daily with a meal. 01/09/14   Lorayne Marek, MD    Physical Exam: Filed Vitals:   11/18/14 1330 11/18/14 1400 11/18/14 1409 11/18/14 1443  BP: 138/76 140/84  149/90  Pulse: 115 131 121 124  Temp:      TempSrc:      Resp: 39 33  28  Height:      Weight:      SpO2: 100% 99%  100%    Physical Exam  Constitutional: Appears well-developed and well-nourished. No distress.  HENT: Normocephalic. No tonsillar erythema or exudates Eyes: Conjunctivae and EOM are normal. PERRLA, no scleral icterus.  Neck: Normal ROM. Neck supple. No JVD. No tracheal deviation. No thyromegaly.  CVS: tachycardic, S1/S2 +, no murmurs, no gallops, no carotid bruit.  Pulmonary: Effort and breath sounds normal, no stridor, rhonchi, wheezes, rales.  Abdominal: Soft. BS +,  no distension, tenderness, rebound or guarding.  Musculoskeletal: Normal range of motion. No edema and no tenderness.  Lymphadenopathy: No lymphadenopathy noted, cervical, inguinal. Neuro: Alert. Normal reflexes, muscle tone coordination. No focal neurologic deficits. Skin: Skin is warm and dry. No rash noted.  No erythema. No pallor.  Psychiatric: unable to assess due to altered mental status   Labs on Admission:  Basic Metabolic Panel:  Recent Labs Lab 11/18/14 0524 11/18/14 0842 11/18/14 1335  NA 128* 125* 129*  K 4.5 4.1 4.7  CL 91* 95* 107  CO2 <5* <5* QUANTITY NOT SUFFICIENT, UNABLE TO PERFORM TEST  GLUCOSE 535* 310* 173*  BUN $Re'13 13 13  'jKY$ CREATININE 1.61* 1.29* 1.22  CALCIUM 9.1 8.5* 8.1*   Liver Function Tests: No results for input(s): AST, ALT, ALKPHOS, BILITOT, PROT, ALBUMIN in the last 168 hours. No results for input(s): LIPASE, AMYLASE in the last 168 hours. No results for input(s): AMMONIA in the last 168 hours. CBC:  Recent Labs Lab 11/18/14 0524  WBC 21.6*  NEUTROABS 17.0*  HGB 16.5  HCT 46.0  MCV 102.2*  PLT 232   Cardiac Enzymes: No results for input(s): CKTOTAL, CKMB, CKMBINDEX, TROPONINI in the last 168 hours. BNP: Invalid input(s):  POCBNP CBG:  Recent Labs Lab 11/18/14 0704 11/18/14 0815 11/18/14 0941 11/18/14 1044 11/18/14 1142  GLUCAP 432* 369* 279* 199* 190*    If 7PM-7AM, please contact night-coverage [email or web address] Password [facility] 11/18/2014, 3:40 PM

## 2014-11-18 NOTE — Progress Notes (Addendum)
CRITICAL VALUE ALERT  Critical value received:  Positive MRSA  Date of notification:  11-18-2014  Time of notification:  1207  Critical value read back:  yes  Nurse who received alert:  Roney Jaffe, RN  MD notified (1st page):  Dr. Elisabeth Pigeon  Time of first page:  12:22  MD notified (2nd page):  Time of second page:  Responding MD: Dr. Elisabeth Pigeon  Time MD responded:  12:27

## 2014-11-18 NOTE — ED Notes (Signed)
Pt noted to be hallucinating in room, holding hands out in front of him, speaking to people that aren't there. Very fidgety, messing with wires and clothing, reaching for things that aren't there, saying they are for his friend at the bedside but pt alone in room. Dr. Elisabeth Pigeon made aware of hallucinations, no intervention ordered at this time. Verbal orders received for UDS and I&O cath, stat chest xray.

## 2014-11-18 NOTE — Progress Notes (Signed)
Handoff report received from Haley, RN

## 2014-11-18 NOTE — ED Notes (Signed)
Pt transported from home by EMS with c/o shob, pt states to EMS he has not take insulin in 3 mo. Increased RR today, IV establish, NS infusing. Pt states he was drinking etoh daily, quit on his own 3 days ago.

## 2014-11-18 NOTE — Consult Note (Signed)
PULMONARY / CRITICAL CARE MEDICINE   Name: Christopher Robles MRN: [id number] DOB: August 08, 1990    ADMISSION DATE:  11/18/2014 CONSULTATION DATE:  11/18/2014  REFERRING MD :  Dr. Elisabeth Pigeon  CHIEF COMPLAINT:  Agitation/EtOH withdrawal/DKA  HISTORY OF PRESENT ILLNESS:   26M with h/o diabetes type one and alcohol abuse who presented to the ED with acute encephalopathy.  He is unable to provide a history due to his profound encephalopathy.  Per the admitting H&P and ED report he had worseing SOB overt he last 24 hours.    He denied f/c/abdominal pain, n/v to the ED.  He stated his last EtOH drink was several days ago.  PAST MEDICAL HISTORY :   has a past medical history of DM I (diabetes mellitus, type I), uncontrolled; Anxiety; and Seizure.  has past surgical history that includes Appendectomy. Prior to Admission medications   Medication Sig Start Date End Date Taking? Authorizing Provider  acetaminophen (TYLENOL) 500 MG tablet Take 1,000-2,000 mg by mouth every 6 (six) hours as needed (for pain.).    Yes Historical Provider, MD  insulin NPH-regular Human (NOVOLIN 70/30) (70-30) 100 UNIT/ML injection Inject 17 Units into the skin 2 (two) times daily with a meal. 01/09/14   Doris Cheadle, MD   No Known Allergies  FAMILY HISTORY:  has no family status information on file.  SOCIAL HISTORY:  reports that he has never smoked. He does not have any smokeless tobacco history on file. He reports that he does not drink alcohol or use illicit drugs.  REVIEW OF SYSTEMS:  Unable to provide.    VITAL SIGNS: Temp:  [97 F (36.1 C)-101.3 F (38.5 C)] 101.3 F (38.5 C) (07/23 2015) Pulse Rate:  [115-132] 132 (07/23 2000) Resp:  [27-43] 35 (07/23 1900) BP: (122-149)/(57-102) 131/91 mmHg (07/23 2000) SpO2:  [89 %-100 %] 100 % (07/23 1900) Weight:  [70.3 kg (154 lb 15.7 oz)] 70.3 kg (154 lb 15.7 oz) (07/23 0925) HEMODYNAMICS:   VENTILATOR SETTINGS:  N/A   INTAKE / OUTPUT:  Intake/Output Summary (Last 24  hours) at 11/18/14 2222 Last data filed at 11/18/14 1913  Gross per 24 hour  Intake 2081.3 ml  Output    800 ml  Net 1281.3 ml    PHYSICAL EXAMINATION: General:  Obtunded but waxes and wanes to agitated. Neuro:  GCS between 8-15. HEENT:  NCAT.  Dry MM Cardiovascular:  Tachycardic, regular.  No m/r/g Lungs:  CTA b/l Abdomen:  Soft, diffusely TTP, normal bowel sounds Musculoskeletal:  Moves all extremities  Skin:  No rashes  LABS:  CBC  Recent Labs Lab 11/18/14 0524  WBC 21.6*  HGB 16.5  HCT 46.0  PLT 232   Coag's No results for input(s): APTT, INR in the last 168 hours. BMET  Recent Labs Lab 11/18/14 0842 11/18/14 1335 11/18/14 1810  NA 125* 129* 130*  K 4.1 4.7 3.4*  CL 95* 107 103  CO2 <5* QUANTITY NOT SUFFICIENT, UNABLE TO PERFORM TEST <5*  BUN CREATININE 1.29* 1.22 1.52*  GLUCOSE 310* 173* 160*   Electrolytes  Recent Labs Lab 11/18/14 0842 11/18/14 1335 11/18/14 1810  CALCIUM 8.5* 8.1* 8.9   Sepsis Markers  Recent Labs Lab 11/18/14 0842 11/18/14 1010 11/18/14 1335  LATICACIDVEN  --  <0.3* 1.5  PROCALCITON 3.75  --   --    ABG  Recent Labs Lab 11/18/14 1648 11/18/14 2145  PHART 7.139* 7.217*  PCO2ART 11.1* 11.3*  PO2ART 112* 120*   Liver  Enzymes No results for input(s): AST, ALT, ALKPHOS, BILITOT, ALBUMIN in the last 168 hours. Cardiac Enzymes No results for input(s): TROPONINI, PROBNP in the last 168 hours. Glucose  Recent Labs Lab 11/18/14 1142 11/18/14 1243 11/18/14 1339 11/18/14 1601 11/18/14 1710 11/18/14 1813  GLUCAP 190* 160* 183* 176* 189* 178*    Imaging Dg Chest 2 View  11/18/2014   CLINICAL DATA:  Severe shortness of breath today.  EXAM: CHEST  2 VIEW  COMPARISON:  PA and lateral chest 11/15/2005.  FINDINGS: Lung volumes are low but the lungs are clear. Heart size is normal. No pneumothorax or pleural effusion.  IMPRESSION: No acute disease.   Electronically Signed   By: Drusilla Kanner M.D.   On:  11/18/2014 09:19     ASSESSMENT / PLAN: 24 yo male with acute toxic/metabolic encephalopathy 2/2 EtOH w/d and DKA.  Active problem list: Acute toxic/metabolic encephalopathy EtOH withdrawal hyponatremia Metabolic acidosis with incomplete respiratory compensation.   PULMONARY A: tachypnea.  Incomplete respiratory compensation for metabolic acidosis. P:   - avoiding intubation.  If needed will need to preserve minute ventilation  CARDIOVASCULAR A: Tachycardia likely 2/2 EtOH withdrawal and hypersympathetic response. P:  - HR improved with volume resucitation, ativan and dexmetatomidine gtt - Sinus tach - tele  RENAL A:  Profound AGMA/NAGMA with incomplete respiratory compensation.  2/2 DKA likely with EtOH ketosis contribution.  Mildly improving with volume resuscitation.  AKI likely 2/2 hypovolemia. P:   - Continued volume resucitation with LR given Acidemia - holding bicarb gtt at this time given DKA though this may need to be utilized at some point with aggressive potassium repletion.  INFECTIOUS A:  SIRS, no suspected source of infection at this time.  CXR had opacity on RLL posteriorly but no other clear source P:   BCx2 11/18/2014 UC none Sputum ordered Abx: Vanc/zosyn, start date7/23/2016, low threashold to de-escalate.  ENDOCRINE A:  DKA.  BG under control on insulin gtt.  AG still 20, lactate negative.  Precipitating event may have been intoxication with non-adherance P:   - continue insulin gtt  - volume resucitation - DKA protocol   NEUROLOGIC A:  Acute toxic/metabolic encephalopathy P:   RASS goal: +1 - continue CIWA with lorazepam - continue precedex - tx underlying causes as above. - check LFT   Total critical care time: 60 min  Critical care time was exclusive of separately billable procedures and treating other patients.  Critical care was necessary to treat or prevent imminent or life-threatening deterioration.  Critical care was time spent  personally by me on the following activities: development of treatment plan with patient and/or surrogate as well as nursing, discussions with consultants, evaluation of patient's response to treatment, examination of patient, obtaining history from patient or surrogate, ordering and performing treatments and interventions, ordering and review of laboratory studies, ordering and review of radiographic studies, pulse oximetry and re-evaluation of patient's condition.   Galvin Proffer, DO., MS [facility]     Pulmonary and Critical Care Medicine [facility] Pager: [phone]  11/18/2014, 10:22 PM

## 2014-11-18 NOTE — ED Notes (Signed)
EKG given to EDP,Wickline,MD., for review. 

## 2014-11-18 NOTE — ED Notes (Signed)
Update called to Marylene Land, RN ICU/SD.

## 2014-11-18 NOTE — Progress Notes (Signed)
eLink Physician-Brief Progress Note Patient Name: Christopher Robles DOB: [birth year] MRN: [id number]   Date of Service  11/18/2014  HPI/Events of Note  24 yr old diabetic male who is alcoholic presented with DKA and signs of ETOH withdrawal.  Called by nurses for severe agitation.  He had pulled out IV's.   At time of my evaluation patient found hypertensive and tachypneic and not restless after being combative requiring assistance of security.     eICU Interventions  Frequency and amount of ativan increased although will need to be cautious about oversedation as suppressing resp drive could significantly worsen his acidemia. ABG now     Intervention Category Major Interventions: Acid-Base disturbance - evaluation and management Minor Interventions: Agitation / anxiety - evaluation and management  Henry Russel, P 11/18/2014, 4:11 PM

## 2014-11-19 ENCOUNTER — Inpatient Hospital Stay (HOSPITAL_COMMUNITY): Payer: Self-pay

## 2014-11-19 ENCOUNTER — Inpatient Hospital Stay (HOSPITAL_COMMUNITY): Payer: MEDICAID

## 2014-11-19 DIAGNOSIS — N179 Acute kidney failure, unspecified: Secondary | ICD-10-CM

## 2014-11-19 DIAGNOSIS — E876 Hypokalemia: Secondary | ICD-10-CM

## 2014-11-19 LAB — GLUCOSE, CAPILLARY
GLUCOSE-CAPILLARY: 178 mg/dL — AB (ref 65–99)
GLUCOSE-CAPILLARY: 168 mg/dL — AB (ref 65–99)
GLUCOSE-CAPILLARY: 161 mg/dL — AB (ref 65–99)
GLUCOSE-CAPILLARY: 173 mg/dL — AB (ref 65–99)
GLUCOSE-CAPILLARY: 158 mg/dL — AB (ref 65–99)
Glucose-Capillary: 116 mg/dL — ABNORMAL HIGH (ref 65–99)
Glucose-Capillary: 125 mg/dL — ABNORMAL HIGH (ref 65–99)
Glucose-Capillary: 144 mg/dL — ABNORMAL HIGH (ref 65–99)
Glucose-Capillary: 155 mg/dL — ABNORMAL HIGH (ref 65–99)
Glucose-Capillary: 157 mg/dL — ABNORMAL HIGH (ref 65–99)
Glucose-Capillary: 163 mg/dL — ABNORMAL HIGH (ref 65–99)
Glucose-Capillary: 178 mg/dL — ABNORMAL HIGH (ref 65–99)
Glucose-Capillary: 185 mg/dL — ABNORMAL HIGH (ref 65–99)
Glucose-Capillary: 189 mg/dL — ABNORMAL HIGH (ref 65–99)
Glucose-Capillary: 244 mg/dL — ABNORMAL HIGH (ref 65–99)
Glucose-Capillary: 132 mg/dL — ABNORMAL HIGH (ref 65–99)
Glucose-Capillary: 147 mg/dL — ABNORMAL HIGH (ref 65–99)
Glucose-Capillary: 166 mg/dL — ABNORMAL HIGH (ref 65–99)
Glucose-Capillary: 173 mg/dL — ABNORMAL HIGH (ref 65–99)
Glucose-Capillary: 150 mg/dL — ABNORMAL HIGH (ref 65–99)
Glucose-Capillary: 131 mg/dL — ABNORMAL HIGH (ref 65–99)
Glucose-Capillary: 178 mg/dL — ABNORMAL HIGH (ref 65–99)
Glucose-Capillary: 182 mg/dL — ABNORMAL HIGH (ref 65–99)
Glucose-Capillary: 202 mg/dL — ABNORMAL HIGH (ref 65–99)
Glucose-Capillary: 216 mg/dL — ABNORMAL HIGH (ref 65–99)
Glucose-Capillary: 180 mg/dL — ABNORMAL HIGH (ref 65–99)
Glucose-Capillary: 176 mg/dL — ABNORMAL HIGH (ref 65–99)
Glucose-Capillary: 162 mg/dL — ABNORMAL HIGH (ref 65–99)

## 2014-11-19 LAB — CBC WITH DIFFERENTIAL/PLATELET
BLASTS: 0 %
Band Neutrophils: 0 % (ref 0–10)
Basophils Absolute: 0 10*3/uL (ref 0.0–0.1)
Basophils Relative: 0 % (ref 0–1)
EOS PCT: 0 % (ref 0–5)
Eosinophils Absolute: 0 10*3/uL (ref 0.0–0.7)
HCT: 38.6 % — ABNORMAL LOW (ref 39.0–52.0)
Hemoglobin: 14.2 g/dL (ref 13.0–17.0)
Lymphocytes Relative: 7 % — ABNORMAL LOW (ref 12–46)
Lymphs Abs: 0.4 10*3/uL — ABNORMAL LOW (ref 0.7–4.0)
MCH: 35.1 pg — ABNORMAL HIGH (ref 26.0–34.0)
MCHC: 36.8 g/dL — ABNORMAL HIGH (ref 30.0–36.0)
MCV: 95.3 fL (ref 78.0–100.0)
METAMYELOCYTES PCT: 0 %
MONO ABS: 0.9 10*3/uL (ref 0.1–1.0)
Monocytes Relative: 16 % — ABNORMAL HIGH (ref 3–12)
Myelocytes: 0 %
NEUTROS PCT: 77 % (ref 43–77)
NRBC: 0 /100{WBCs}
Neutro Abs: 4.1 10*3/uL (ref 1.7–7.7)
OTHER: 0 %
PLATELETS: 106 10*3/uL — AB (ref 150–400)
Promyelocytes Absolute: 0 %
RBC: 4.05 MIL/uL — AB (ref 4.22–5.81)
RDW: 13 % (ref 11.5–15.5)
WBC MORPHOLOGY: INCREASED
WBC: 5.4 10*3/uL (ref 4.0–10.5)

## 2014-11-19 LAB — BASIC METABOLIC PANEL
ANION GAP: 16 — AB (ref 5–15)
ANION GAP: 11 (ref 5–15)
Anion gap: 11 (ref 5–15)
Anion gap: 8 (ref 5–15)
BUN: 14 mg/dL (ref 6–20)
BUN: 16 mg/dL (ref 6–20)
BUN: 19 mg/dL (ref 6–20)
BUN: 19 mg/dL (ref 6–20)
CALCIUM: 8.8 mg/dL — AB (ref 8.9–10.3)
CALCIUM: 8.5 mg/dL — AB (ref 8.9–10.3)
CHLORIDE: 108 mmol/L (ref 101–111)
CO2: 9 mmol/L — ABNORMAL LOW (ref 22–32)
CO2: 12 mmol/L — AB (ref 22–32)
CO2: 12 mmol/L — ABNORMAL LOW (ref 22–32)
CO2: 13 mmol/L — ABNORMAL LOW (ref 22–32)
CREATININE: 1.29 mg/dL — AB (ref 0.61–1.24)
Calcium: 8.9 mg/dL (ref 8.9–10.3)
Calcium: 8.7 mg/dL — ABNORMAL LOW (ref 8.9–10.3)
Chloride: 106 mmol/L (ref 101–111)
Chloride: 110 mmol/L (ref 101–111)
Chloride: 110 mmol/L (ref 101–111)
Creatinine, Ser: 1.55 mg/dL — ABNORMAL HIGH (ref 0.61–1.24)
Creatinine, Ser: 1.23 mg/dL (ref 0.61–1.24)
Creatinine, Ser: 1.24 mg/dL (ref 0.61–1.24)
GFR calc Af Amer: >60 mL/min (ref >60)
GFR calc Af Amer: >60 mL/min (ref >60)
GFR calc non Af Amer: >60 mL/min (ref >60)
GFR calc non Af Amer: >60 mL/min (ref >60)
GLUCOSE: 119 mg/dL — AB (ref 65–99)
GLUCOSE: 163 mg/dL — AB (ref 65–99)
Glucose, Bld: 182 mg/dL — ABNORMAL HIGH (ref 65–99)
Glucose, Bld: 179 mg/dL — ABNORMAL HIGH (ref 65–99)
POTASSIUM: 2.9 mmol/L — AB (ref 3.5–5.1)
Potassium: 2.6 mmol/L — CL (ref 3.5–5.1)
Potassium: 2.6 mmol/L — CL (ref 3.5–5.1)
Potassium: 2.9 mmol/L — ABNORMAL LOW (ref 3.5–5.1)
SODIUM: 133 mmol/L — AB (ref 135–145)
Sodium: 131 mmol/L — ABNORMAL LOW (ref 135–145)
Sodium: 131 mmol/L — ABNORMAL LOW (ref 135–145)
Sodium: 131 mmol/L — ABNORMAL LOW (ref 135–145)

## 2014-11-19 LAB — STREP PNEUMONIAE URINARY ANTIGEN: STREP PNEUMO URINARY ANTIGEN: NEGATIVE

## 2014-11-19 LAB — PHOSPHORUS

## 2014-11-19 LAB — MAGNESIUM: Magnesium: 1.5 mg/dL — ABNORMAL LOW (ref 1.7–2.4)

## 2014-11-19 MED ORDER — KCL IN DEXTROSE-NACL 40-5-0.45 MEQ/L-%-% IV SOLN
INTRAVENOUS | Status: DC
  Administered 2014-11-19: 125 mL via INTRAVENOUS
  Administered 2014-11-19: 09:00:00 via INTRAVENOUS
  Administered 2014-11-20: 125 mL/h via INTRAVENOUS
  Administered 2014-11-20: 125 mL via INTRAVENOUS
  Administered 2014-11-20 – 2014-11-21 (×3): via INTRAVENOUS
  Filled 2014-11-19 (×8): qty 1000

## 2014-11-19 MED ORDER — POTASSIUM CHLORIDE 10 MEQ/100ML IV SOLN
10.0000 meq | INTRAVENOUS | Status: AC
  Administered 2014-11-19 (×5): 10 meq via INTRAVENOUS
  Filled 2014-11-19 (×2): qty 100

## 2014-11-19 MED ORDER — ACETAMINOPHEN 650 MG RE SUPP
650.0000 mg | RECTAL | Status: DC | PRN
  Administered 2014-11-19: 650 mg via RECTAL
  Filled 2014-11-19: qty 1

## 2014-11-19 MED ORDER — MAGNESIUM SULFATE 2 GM/50ML IV SOLN
2.0000 g | Freq: Once | INTRAVENOUS | Status: AC
  Administered 2014-11-19: 2 g via INTRAVENOUS
  Filled 2014-11-19: qty 50

## 2014-11-19 MED ORDER — SODIUM CHLORIDE 0.9 % IV BOLUS (SEPSIS)
500.0000 mL | Freq: Once | INTRAVENOUS | Status: AC
  Administered 2014-11-19: 500 mL via INTRAVENOUS

## 2014-11-19 MED ORDER — POTASSIUM CHLORIDE 10 MEQ/100ML IV SOLN
10.0000 meq | INTRAVENOUS | Status: AC
  Administered 2014-11-19 (×2): 10 meq via INTRAVENOUS
  Filled 2014-11-19: qty 100

## 2014-11-19 MED ORDER — BISACODYL 10 MG RE SUPP
10.0000 mg | Freq: Once | RECTAL | Status: AC
  Administered 2014-11-19: 10 mg via RECTAL
  Filled 2014-11-19: qty 1

## 2014-11-19 MED ORDER — POTASSIUM CHLORIDE 10 MEQ/100ML IV SOLN
10.0000 meq | INTRAVENOUS | Status: AC
  Administered 2014-11-19 – 2014-11-20 (×4): 10 meq via INTRAVENOUS
  Filled 2014-11-19: qty 100

## 2014-11-19 MED ORDER — CHLORHEXIDINE GLUCONATE CLOTH 2 % EX PADS
6.0000 | MEDICATED_PAD | Freq: Every day | CUTANEOUS | Status: DC
  Administered 2014-11-19 – 2014-11-20 (×2): 6 via TOPICAL

## 2014-11-19 MED ORDER — POTASSIUM PHOSPHATES 15 MMOLE/5ML IV SOLN
30.0000 mmol | Freq: Once | INTRAVENOUS | Status: AC
  Administered 2014-11-19: 30 mmol via INTRAVENOUS
  Filled 2014-11-19: qty 10

## 2014-11-19 NOTE — Progress Notes (Signed)
Pt noted with only 100 cc UOP since this AM. Bladder scanned and recorded >999 cc in bladder. Notified Dr Malachi Bonds and received a verbal order for Foley Cath. However pt refused the Foley cath and requested a privacy to urinate, stated " he just forgot to do it". Pt urinated without difficulty.

## 2014-11-19 NOTE — Progress Notes (Signed)
[facility] PROGRESS NOTE  Christopher Robles WUJ:[id number] DOB: 1991/02/21 DOA: 11/18/2014 PCP: Doris Cheadle, MD  Brief Summary  24 year old male with past medical history of diabetes mellitus who presented to [facility] ED with altered mental status and respiratory distress. Per ED report, the patient had worsening shortness of breath over the past 24 hours but no associated fevers or chills or cough, abdominal pain, nausea or vomiting.   In ED, BP was 124/57, HR was 124, RR 27-35, oxygen saturation was 89-100% on room air. Blood work showed WBC count 21.6, sodium 128, creatinine 1.61. His UDS and alcohol level were WNL. ABG showed pO2 of 38. He was profoundly Cono Gebhard of breath but CXR was negative.  He was placed on vanco and zosyn for sepsis due to possible pneumonia. In addition, his CBG was 558 with CO2 less than 5 and ketones in urine c/w DKA.    Assessment/Plan  Principal Problem: Acute encephalopathy / Acute alcohol withdrawal /delirium - continue precedex, monitoring BP and HR carefully with assistance of critical care - Alcohol level WNL - Continue in SDU - Continue supportive care with IV fluids   Active Problems: DKA (diabetic ketoacidoses) / Diabetes mellitus, uncontrolled, CO2 gradually trending up -  Increase dextrose fluids to continue to resolve acidosis -  Continue insulin drip - A1c pending - Continue BMP q6h today until CO2 > 20, then transition to subcut insulin  Sepsis due to possible pneumonia / Leukocytosis / acute respiratory failure with hypoxia, high fever.  Meningoencephalitis less likely given no nuchal rigidity.  CXR is not impressive for pneumonia and UA is negative.  He has some diffuse abdominal pain which may be related to his DKA, but if not improving, consider CT abd/pelvis.  Currently, due to AKI, he cannot get IV contrast and he is not mentating well enough for oral contrast so sensitivity of study may be less.  He is already on broad spectrum antibiotics.   -  CXR/KUB:  No clear pneumonia or evidence of bowel perforation.  Constipation  - bisacodyl x 1 rectal - blood cultures NGTD - lactic acid 1.5, procalcitonin level 3.75 - legionella pending and strep pneumonia ng.   Hyponatremia - Likely from alcohol abuse and hyperglycemia - Continue IV fluids  Acute renal failure, baseline creatinine 0.4.  Trending down - Likely from alcohol abuse, sepsis - Continue IV fluids   Hypokalemia due to DKA -  IV potassium repletion -  Magnesium:  1.5: start IV magnesium repletion until able to tolerate PO  DVT prophylaxis:  - SCD's bilaterally   Code Status: Full Family Communication: Plan of care discussed with the patient  Disposition Plan:  Continue stepdown  Consultants:  PCCM  Procedures:  CXR/KUB  Antibiotics:  vanc 7/23 >   Zosyn 7/23 >   HPI/Subjective:  Patient states he has some abdominal pain and nausea.  Denies cough, SOB, but appears SOB  Objective: Filed Vitals:   11/19/14 0500 11/19/14 0600 11/19/14 0700 11/19/14 0802  BP: 113/72 100/52 104/58   Pulse: 99 90 91   Temp:    100.2 F (37.9 C)  TempSrc:    Axillary  Resp: 30 31 36   Height:      Weight:      SpO2: 100% 99% 99%     Intake/Output Summary (Last 24 hours) at 11/19/14 1118 Last data filed at 11/19/14 0703  Gross per 24 hour  Intake 4847.15 ml  Output   1200 ml  Net 3647.15 ml  Filed Weights   11/18/14 0925 11/19/14 0415  Weight: 70.3 kg (154 lb 15.7 oz) 71.7 kg (158 lb 1.1 oz)   Body mass index is 25.53 kg/(m^2).  Exam:   General:  Adult male, tachypneic to 30s, no cough or obvious wheeze  HEENT:  NCAT, MMM, no nuchal rigidity  Cardiovascular:  Tachycardic, RR, nl S1, S2 no mrg, 2+ pulses, warm extremities  Respiratory:  CTAB, no increased WOB  Abdomen:   NABS, soft, nondistended, TTP diffusely without rebound or guarding  MSK:   Normal tone and bulk, no LEE  Neuro:  Grossly moves all extremities.  Does not open eyes, but answers  a few questions appropriately verbrally  Data Reviewed: Basic Metabolic Panel:  Recent Labs Lab 11/18/14 1335 11/18/14 1810 11/18/14 2200 11/19/14 0155 11/19/14 0722  NA 129* 130* 133* 131* 131*  K 4.7 3.4* 3.3* 2.6* 2.6*  CL 107 103 108 106 108  CO2 QUANTITY NOT SUFFICIENT, UNABLE TO PERFORM TEST <5* 5* 9* 12*  GLUCOSE 173* 160* 171* 182* 119*  BUN 13 13 14 14 16   CREATININE 1.22 1.52* 1.73* 1.55* 1.29*  CALCIUM 8.1* 8.9 9.1 8.9 8.8*  MG  --   --   --   --  1.5*   Liver Function Tests: No results for input(s): AST, ALT, ALKPHOS, BILITOT, PROT, ALBUMIN in the last 168 hours. No results for input(s): LIPASE, AMYLASE in the last 168 hours. No results for input(s): AMMONIA in the last 168 hours. CBC:  Recent Labs Lab 11/18/14 0524 11/19/14 0722  WBC 21.6* 5.4  NEUTROABS 17.0* 4.1  HGB 16.5 14.2  HCT 46.0 38.6*  MCV 102.2* 95.3  PLT 232 106*    Recent Results (from the past 240 hour(s))  MRSA PCR Screening     Status: Abnormal   Collection Time: 11/18/14  9:00 AM  Result Value Ref Range Status   MRSA by PCR POSITIVE (A) NEGATIVE Final    Comment:        The GeneXpert MRSA Assay (FDA approved for NASAL specimens only), is one component of a comprehensive MRSA colonization surveillance program. It is not intended to diagnose MRSA infection nor to guide or monitor treatment for MRSA infections. CRITICAL RESULT CALLED TO, READ BACK BY AND VERIFIED WITH: A.ASHLEY AT 1207 ON 11/18/14 BY S.VANHOORNE   Culture, blood (x 2)     Status: None (Preliminary result)   Collection Time: 11/18/14 10:10 AM  Result Value Ref Range Status   Specimen Description BLOOD LEFT ARM  Final   Special Requests   Final    BOTTLES DRAWN AEROBIC ONLY 7CC Performed at [facility]    Culture PENDING  Incomplete   Report Status PENDING  Incomplete  Culture, blood (x 2)     Status: None (Preliminary result)   Collection Time: 11/18/14 10:32 AM  Result Value Ref Range Status    Specimen Description BLOOD RIGHT HAND  Final   Special Requests   Final    BOTTLES DRAWN AEROBIC AND ANAEROBIC 10CC Performed at [facility]    Culture PENDING  Incomplete   Report Status PENDING  Incomplete     Studies: Dg Chest 2 View  11/18/2014   CLINICAL DATA:  Severe shortness of breath today.  EXAM: CHEST  2 VIEW  COMPARISON:  PA and lateral chest 11/15/2005.  FINDINGS: Lung volumes are low but the lungs are clear. Heart size is normal. No pneumothorax or pleural effusion.  IMPRESSION: No acute disease.  Electronically Signed   By: Drusilla Kanner M.D.   On: 11/18/2014 09:19   Dg Chest Port 1 View  11/19/2014   CLINICAL DATA:  Dyspnea and abdominal pain.  EXAM: PORTABLE CHEST - 1 VIEW  COMPARISON:  11/18/2014  FINDINGS: Stable low bilateral lung volumes with bibasilar atelectasis. The heart size and mediastinal contours are normal. There is no evidence of pulmonary edema, consolidation, pneumothorax, nodule or pleural fluid.  IMPRESSION: Stable low bilateral lung volumes.   Electronically Signed   By: Irish Lack M.D.   On: 11/19/2014 09:22   Dg Abd Portable 1v  11/19/2014   CLINICAL DATA:  Dyspnea and abdominal pain today. Initial encounter.  EXAM: PORTABLE ABDOMEN - 1 VIEW  COMPARISON:  None.  FINDINGS: The bowel gas pattern is nonobstructive. Moderate stool burden throughout the colon is noted. No abnormal abdominal calcification is seen. No bony abnormality is identified.  IMPRESSION: Negative exam.   Electronically Signed   By: Drusilla Kanner M.D.   On: 11/19/2014 09:23    Scheduled Meds: . antiseptic oral rinse  7 mL Mouth Rinse q12n4p  . bisacodyl  10 mg Rectal Once  . chlorhexidine  15 mL Mouth Rinse BID  . Chlorhexidine Gluconate Cloth  6 each Topical Q0600  . folic acid  1 mg Oral Daily  . multivitamin with minerals  1 tablet Oral Daily  . mupirocin ointment  1 application Nasal BID  . piperacillin-tazobactam (ZOSYN)  IV  3.375 g Intravenous Q8H  .  potassium chloride  10 mEq Intravenous Q1 Hr x 6  . thiamine  100 mg Oral Daily   Or  . thiamine  100 mg Intravenous Daily  . vancomycin  750 mg Intravenous Q12H   Continuous Infusions: . dexmedetomidine 0.3 mcg/kg/hr (11/19/14 0839)  . dextrose 5 % and 0.45 % NaCl with KCl 40 mEq/L 100 mL/hr at 11/19/14 0830  . insulin (NOVOLIN-R) infusion 3.2 Units/hr (11/19/14 0700)    Active Problems:   DKA (diabetic ketoacidoses)    Time spent: 30 min    Kamani Magnussen, [facility]  [facility] Pager [phone]. If 7PM-7AM, please contact night-coverage at [email or web address], password [facility] 11/19/2014, 11:18 AM  LOS: 1 day

## 2014-11-19 NOTE — Progress Notes (Signed)
CRITICAL VALUE ALERT  Critical value received:  K 2.6  Date of notification:  11/19/14  Time of notification:  0440  Critical value read back:Yes.    Nurse who received alert:  Effie Berkshire  MD notified (1st page):  L Easterwood  Time of first page:  0444  MD notified (2nd page):  Time of second page:  Responding MD: Coralie Carpen  Time MD responded:  [phone]

## 2014-11-19 NOTE — Consult Note (Signed)
PULMONARY / CRITICAL CARE MEDICINE   Name: Christopher Robles MRN: [id number] DOB: 1991/03/07    ADMISSION DATE:  11/18/2014 CONSULTATION DATE:  11/18/2014  REFERRING MD :  Dr. Elisabeth Pigeon  CHIEF COMPLAINT:  Agitation/EtOH withdrawal/DKA  HISTORY OF PRESENT ILLNESS:   60M with h/o diabetes type one and alcohol abuse who presented to the ED with acute encephalopathy.  He is unable to provide a history due to his profound encephalopathy.  Per the admitting H&P and ED report he had worseing SOB overt he last 24 hours.    He denied f/c/abdominal pain, n/v to the ED.  He stated his last EtOH drink was several days ago.  Subjective: remains confused but less agitated with precedex.  VITAL SIGNS: Temp:  [99 F (37.2 C)-102.3 F (39.1 C)] 100.2 F (37.9 C) (07/24 0802) Pulse Rate:  [90-133] 91 (07/24 0700) Resp:  [28-44] 36 (07/24 0700) BP: (91-149)/(47-96) 104/58 mmHg (07/24 0700) SpO2:  [97 %-100 %] 99 % (07/24 0700) Weight:  [71.7 kg (158 lb 1.1 oz)] 71.7 kg (158 lb 1.1 oz) (07/24 0415)   HEMODYNAMICS:   VENTILATOR SETTINGS:  N/A   INTAKE / OUTPUT:  Intake/Output Summary (Last 24 hours) at 11/19/14 1039 Last data filed at 11/19/14 0703  Gross per 24 hour  Intake 6174.44 ml  Output   1700 ml  Net 4474.44 ml   PHYSICAL EXAMINATION: General:  Obtunded but waxes and wanes to agitated. Neuro:  GCS between 8-15. HEENT:  NCAT.  Dry MM Cardiovascular:  Tachycardic, regular.  No m/r/g Lungs:  CTA b/l Abdomen:  Soft, diffusely TTP, normal bowel sounds Musculoskeletal:  Moves all extremities  Skin:  No rashes  LABS:  CBC  Recent Labs Lab 11/18/14 0524 11/19/14 0722  WBC 21.6* 5.4  HGB 16.5 14.2  HCT 46.0 38.6*  PLT 232 106*   Coag's No results for input(s): APTT, INR in the last 168 hours. BMET  Recent Labs Lab 11/18/14 2200 11/19/14 0155 11/19/14 0722  NA 133* 131* 131*  K 3.3* 2.6* 2.6*  CL 108 106 108  CO2 5* 9* 12*  BUN CREATININE 1.73* 1.55* 1.29*   GLUCOSE 171* 182* 119*   Electrolytes  Recent Labs Lab 11/18/14 2200 11/19/14 0155 11/19/14 0722  CALCIUM 9.1 8.9 8.8*  MG  --   --  1.5*   Sepsis Markers  Recent Labs Lab 11/18/14 0842 11/18/14 1010 11/18/14 1335  LATICACIDVEN  --  <0.3* 1.5  PROCALCITON 3.75  --   --    ABG  Recent Labs Lab 11/18/14 1648 11/18/14 2145  PHART 7.139* 7.217*  PCO2ART 11.1* 11.3*  PO2ART 112* 120*   Liver Enzymes No results for input(s): AST, ALT, ALKPHOS, BILITOT, ALBUMIN in the last 168 hours. Cardiac Enzymes No results for input(s): TROPONINI, PROBNP in the last 168 hours. Glucose  Recent Labs Lab 11/19/14 0355 11/19/14 0453 11/19/14 0555 11/19/14 0652 11/19/14 0800 11/19/14 0904  GLUCAP 144* 244* 125* 116* 132* 147*   Imaging Dg Chest Port 1 View  11/19/2014   CLINICAL DATA:  Dyspnea and abdominal pain.  EXAM: PORTABLE CHEST - 1 VIEW  COMPARISON:  11/18/2014  FINDINGS: Stable low bilateral lung volumes with bibasilar atelectasis. The heart size and mediastinal contours are normal. There is no evidence of pulmonary edema, consolidation, pneumothorax, nodule or pleural fluid.  IMPRESSION: Stable low bilateral lung volumes.   Electronically Signed   By: Irish Lack M.D.   On: 11/19/2014 09:22   Dg Abd  Portable 1v  11/19/2014   CLINICAL DATA:  Dyspnea and abdominal pain today. Initial encounter.  EXAM: PORTABLE ABDOMEN - 1 VIEW  COMPARISON:  None.  FINDINGS: The bowel gas pattern is nonobstructive. Moderate stool burden throughout the colon is noted. No abnormal abdominal calcification is seen. No bony abnormality is identified.  IMPRESSION: Negative exam.   Electronically Signed   By: Drusilla Kanner M.D.   On: 11/19/2014 09:23   ASSESSMENT / PLAN: 24 yo male with acute toxic/metabolic encephalopathy 2/2 EtOH w/d and DKA.  Active problem list: Acute toxic/metabolic encephalopathy EtOH withdrawal hyponatremia Metabolic acidosis with incomplete respiratory  compensation.  PULMONARY A: tachypnea to compensate for metabolic acidosis P:   - Avoiding intubation.  If needed will need to preserve minute ventilation. - Titrate O2 as needed for sat of 88-92%.  CARDIOVASCULAR A: Tachycardia likely 2/2 EtOH withdrawal and hypersympathetic response. P:  - HR improved with volume resucitation, ativan and dexmetatomidine gtt - Sinus tach improving. - Tele  RENAL A:  Profound AGMA/NAGMA with incomplete respiratory compensation.  2/2 DKA likely with EtOH ketosis contribution.  Mildly improving with volume resuscitation.  AKI likely 2/2 hypovolemia. Hypokalemia. P:   - Continued volume resucitation with LR given Acidemia - Holding bicarb gtt at this time given DKA though this may need to be utilized at some point with aggressive potassium repletion. - BMET in AM. - Replace electrolytes as indicated.  INFECTIOUS A:  SIRS, no suspected source of infection at this time.  CXR had opacity on RLL posteriorly but no other clear source P:   BCx2 11/18/2014 UC none Sputum ordered Abx: Vanc/zosyn, start date7/23/2016, low threashold to de-escalate. Check procalcitonin.  ENDOCRINE A:  DKA.  BG under control on insulin gtt.  AG still 20, lactate negative.  Precipitating event may have been intoxication with non-adherance P:   - Continue insulin gtt  - Volume resucitation - DKA protocol   NEUROLOGIC A:  Acute toxic/metabolic encephalopathy P:   RASS goal: +1 - Continue CIWA with lorazepam - Continue precedex - Tx underlying causes as above. - Avoid ativan to avoid respiratory suppression that may make acidosis significantly worse.  Mother updated bedside.  The patient is critically ill with multiple organ systems failure and requires high complexity decision making for assessment and support, frequent evaluation and titration of therapies, application of advanced monitoring technologies and extensive interpretation of multiple databases.    Critical Care Time devoted to patient care services described in this note is  35  Minutes. This time reflects time of care of this signee Dr Koren Bound. This critical care time does not reflect procedure time, or teaching time or supervisory time of PA/NP/Med student/Med Resident etc but could involve care discussion time.  Alyson Reedy, M.D. [facility] Pulmonary/Critical Care Medicine. Pager: [phone]. After hours pager: [phone].  11/19/2014, 10:39 AM

## 2014-11-20 ENCOUNTER — Inpatient Hospital Stay (HOSPITAL_COMMUNITY): Payer: Self-pay

## 2014-11-20 ENCOUNTER — Encounter (HOSPITAL_COMMUNITY): Payer: Self-pay | Admitting: Radiology

## 2014-11-20 LAB — BASIC METABOLIC PANEL
ANION GAP: 7 (ref 5–15)
BUN: 17 mg/dL (ref 6–20)
CALCIUM: 8.4 mg/dL — AB (ref 8.9–10.3)
CHLORIDE: 111 mmol/L (ref 101–111)
CO2: 14 mmol/L — AB (ref 22–32)
Creatinine, Ser: 1.19 mg/dL (ref 0.61–1.24)
Glucose, Bld: 176 mg/dL — ABNORMAL HIGH (ref 65–99)
POTASSIUM: 2.9 mmol/L — AB (ref 3.5–5.1)
SODIUM: 132 mmol/L — AB (ref 135–145)

## 2014-11-20 LAB — URINALYSIS, ROUTINE W REFLEX MICROSCOPIC
Bilirubin Urine: NEGATIVE
GLUCOSE, UA: 250 mg/dL — AB
KETONES UR: NEGATIVE mg/dL
Leukocytes, UA: NEGATIVE
Nitrite: NEGATIVE
PH: 5.5 (ref 5.0–8.0)
PROTEIN: 30 mg/dL — AB
SPECIFIC GRAVITY, URINE: 1.016 (ref 1.005–1.030)
Urobilinogen, UA: 0.2 mg/dL (ref 0.0–1.0)

## 2014-11-20 LAB — GLUCOSE, CAPILLARY
GLUCOSE-CAPILLARY: 139 mg/dL — AB (ref 65–99)
GLUCOSE-CAPILLARY: 166 mg/dL — AB (ref 65–99)
GLUCOSE-CAPILLARY: 149 mg/dL — AB (ref 65–99)
GLUCOSE-CAPILLARY: 169 mg/dL — AB (ref 65–99)
Glucose-Capillary: 128 mg/dL — ABNORMAL HIGH (ref 65–99)
Glucose-Capillary: 137 mg/dL — ABNORMAL HIGH (ref 65–99)
Glucose-Capillary: 142 mg/dL — ABNORMAL HIGH (ref 65–99)
Glucose-Capillary: 144 mg/dL — ABNORMAL HIGH (ref 65–99)
Glucose-Capillary: 146 mg/dL — ABNORMAL HIGH (ref 65–99)
Glucose-Capillary: 153 mg/dL — ABNORMAL HIGH (ref 65–99)
Glucose-Capillary: 162 mg/dL — ABNORMAL HIGH (ref 65–99)
Glucose-Capillary: 162 mg/dL — ABNORMAL HIGH (ref 65–99)
Glucose-Capillary: 171 mg/dL — ABNORMAL HIGH (ref 65–99)
Glucose-Capillary: 177 mg/dL — ABNORMAL HIGH (ref 65–99)
Glucose-Capillary: 196 mg/dL — ABNORMAL HIGH (ref 65–99)
Glucose-Capillary: 258 mg/dL — ABNORMAL HIGH (ref 65–99)

## 2014-11-20 LAB — CBC
HCT: 34.5 % — ABNORMAL LOW (ref 39.0–52.0)
Hemoglobin: 12.7 g/dL — ABNORMAL LOW (ref 13.0–17.0)
MCH: 34.6 pg — ABNORMAL HIGH (ref 26.0–34.0)
MCHC: 36.8 g/dL — ABNORMAL HIGH (ref 30.0–36.0)
MCV: 94 fL (ref 78.0–100.0)
Platelets: 104 10*3/uL — ABNORMAL LOW (ref 150–400)
RBC: 3.61 MIL/uL — AB (ref 4.22–5.81)
RDW: 13.1 % (ref 11.5–15.5)
WBC: 6.1 10*3/uL (ref 4.0–10.5)

## 2014-11-20 LAB — PHOSPHORUS: Phosphorus: 1.6 mg/dL — ABNORMAL LOW (ref 2.5–4.6)

## 2014-11-20 LAB — MAGNESIUM
MAGNESIUM: 2 mg/dL (ref 1.7–2.4)
MAGNESIUM: 1.7 mg/dL (ref 1.7–2.4)

## 2014-11-20 LAB — HEMOGLOBIN A1C
Hgb A1c MFr Bld: 10.2 % — ABNORMAL HIGH (ref 4.8–5.6)
MEAN PLASMA GLUCOSE: 246 mg/dL

## 2014-11-20 LAB — VANCOMYCIN, TROUGH: Vancomycin Tr: 14 ug/mL (ref 10.0–20.0)

## 2014-11-20 LAB — URINE MICROSCOPIC-ADD ON

## 2014-11-20 MED ORDER — POTASSIUM CHLORIDE 10 MEQ/100ML IV SOLN
10.0000 meq | INTRAVENOUS | Status: AC
Start: 2014-11-20 — End: 2014-11-20
  Administered 2014-11-20 (×6): 10 meq via INTRAVENOUS
  Filled 2014-11-20 (×4): qty 100

## 2014-11-20 MED ORDER — POTASSIUM CHLORIDE 10 MEQ/100ML IV SOLN
10.0000 meq | INTRAVENOUS | Status: DC
Start: 2014-11-20 — End: 2014-11-20

## 2014-11-20 MED ORDER — LIVING WELL WITH DIABETES BOOK
Freq: Once | Status: AC
  Administered 2014-11-20: 11:00:00
  Filled 2014-11-20: qty 1

## 2014-11-20 MED ORDER — POTASSIUM PHOSPHATES 15 MMOLE/5ML IV SOLN
30.0000 mmol | Freq: Once | INTRAVENOUS | Status: AC
  Administered 2014-11-20: 30 mmol via INTRAVENOUS
  Filled 2014-11-20: qty 10

## 2014-11-20 MED ORDER — IOHEXOL 300 MG/ML  SOLN: 50.0000 mL | INTRAMUSCULAR | Status: AC

## 2014-11-20 MED ORDER — POTASSIUM PHOSPHATES 15 MMOLE/5ML IV SOLN: 30.0000 meq | Freq: Once | INTRAVENOUS | Status: DC

## 2014-11-20 MED ORDER — IOHEXOL 300 MG/ML  SOLN
100.0000 mL | Freq: Once | INTRAMUSCULAR | Status: AC | PRN
  Administered 2014-11-20: 100 mL via INTRAVENOUS

## 2014-11-20 NOTE — Progress Notes (Signed)
CRITICAL VALUE ALERT  Critical value received:  Phos < 0.1  Date of notification:  11/20/14  Time of notification:  0510  Critical value read back:Yes.    Nurse who received alert:  Reuel Boom RN  MD notified (1st page):  Merdis Delay, NP  Time of first page:  [phone]  MD notified (2nd page):  Time of second page:  Responding MD:  Merdis Delay, NP  Time MD responded:  [phone], new orders received

## 2014-11-20 NOTE — Progress Notes (Signed)
eLink Physician-Brief Progress Note Patient Name: Christopher Robles DOB: 01-25-1991 MRN: [id number]   Date of Service  11/20/2014  HPI/Events of Note  Hypokalemia and hypophosphatemia  eICU Interventions  Potassium and phos replaced     Intervention Category Intermediate Interventions: Electrolyte abnormality - evaluation and management  Christopher Robles 11/20/2014, 5:19 AM

## 2014-11-20 NOTE — Care Management Note (Signed)
Case Management Note  Patient Details  Name: Christopher Robles MRN: [id number] Date of Birth: 29-Jan-1991  Subjective/Objective:      dka and resp distress              Action/Plan: home   Expected Discharge Date:      11/23/2014            Expected Discharge Plan:  Home/Self Care  In-House Referral:  NA  Discharge planning Services  CM Consult  Post Acute Care Choice:  NA Choice offered to:  NA  DME Arranged:  N/A DME Agency:  NA  HH Arranged:  NA HH Agency:  NA  Status of Service:  In process, will continue to follow  Medicare Important Message Given:    Date Medicare IM Given:    Medicare IM give by:    Date Additional Medicare IM Given:    Additional Medicare Important Message give by:     If discussed at Long Length of Stay Meetings, dates discussed:    Additional Comments:  Golda Acre, RN 11/20/2014, 9:35 AM

## 2014-11-20 NOTE — Progress Notes (Addendum)
Inpatient Diabetes Program Recommendations  AACE/ADA: New Consensus Statement on Inpatient Glycemic Control (2013)  Target Ranges:  Prepandial:   less than 140 mg/dL      Peak postprandial:   less than 180 mg/dL (1-2 hours)      Critically ill patients:  140 - 180 mg/dL   Results for Christopher Robles, Christopher Robles (MRN [id number]) as of 11/20/2014 09:18  Ref. Range 11/20/2014 03:40  Sodium Latest Ref Range: 135-145 mmol/L 132 (L)  Potassium Latest Ref Range: 3.5-5.1 mmol/L 2.9 (L)  Chloride Latest Ref Range: 101-111 mmol/L 111  CO2 Latest Ref Range: 22-32 mmol/L 14 (L)  BUN Latest Ref Range: 6-20 mg/dL 17  Creatinine Latest Ref Range: 0.61-1.24 mg/dL 1.19  Calcium Latest Ref Range: 8.9-10.3 mg/dL 8.4 (L)  EGFR (Non-African Amer.) Latest Ref Range: >60 mL/min >60  EGFR (African American) Latest Ref Range: >60 mL/min >60  Glucose Latest Ref Range: 65-99 mg/dL 176 (H)  Anion gap Latest Ref Range: 5-15  7  Phosphorus Latest Ref Range: 2.5-4.6 mg/dL <0.1 (LL)  Magnesium Latest Ref Range: 1.7-2.4 mg/dL 2.0    Admit with: DKA/ ETOH Withdrawal/ Pneumonia/ Sepsis  History: Type 1 DM  Home DM Meds: 70/30 insulin- 17 units bidwc  Current DM Orders: IV Insulin drip per DKA Protocol    -Note patient's CO2 still low on BMET this AM (14).  Patient also having major issues with low potassium and low phosphate levels this AM.  Note K+ and PO4 replacement ordered this AM.  -Patient remains on IV insulin drip since 07/23.  -Current A1c pending.  -Note D5 1/2 NS IVF increased to 125 cc/hour yesterday.     MD- Once patient is finally ready to transition off IV insulin drip, recommend giving patient Levemir 1-2 hours before IV Insulin drip stopped.  Patient gets about 24 units long-acting insulin in his two doses of 70/30 insulin per day.  Would give Levemir 24 units x one dose and start Novolog Sensitive (0-9 units) SSI.  Once patient is eating and able to maintain PO Intake, can switch patient back to his  home doses of 70/30 insulin.  Will not be able to afford Levemir and Novolog after d/c since he has No health insurance.  Also, given history of non-adherence, 70/30 insulin bidwc may be the easiest insulin option available for patient at present.     Will follow Wyn Quaker RN, MSN, CDE Diabetes Coordinator Inpatient Glycemic Control Team Team Pager: [phone] (8a-5p)

## 2014-11-20 NOTE — Progress Notes (Signed)
Nursing unable to obtain sputum sample for a culture , due to patient's inability to cough up any secretion.

## 2014-11-20 NOTE — Progress Notes (Signed)
Date:  November 20, 2014 U.R. performed for needs and level of care. Will continue to follow for Case Management needs.  Rhonda Davis, RN, BSN, CCM   [phone] 

## 2014-11-20 NOTE — Progress Notes (Signed)
Patient ID: Christopher Robles, male   DOB: [birth year], 24 y.o.   MRN: [id number] [facility] PROGRESS NOTE  Christopher Robles:[id number] DOB: [birth year] DOA: 11/18/2014 PCP: Doris Cheadle, MD  Brief narrative:    24 year old male with past medical history of diabetes mellitus who presented to [facility] ED with altered mental status and respiratory distress. Per ED report, the patient had worsening shortness of breath over the past 24 hours prior to this admission but no associated fevers or chills or cough, abdominal pain, nausea or vomiting.  In ED, BP was 124/57, HR was 124, RR 27-35, oxygen saturation was 89-100% on room air. Blood work showed WBC count 21.6, sodium 128, creatinine 1.61. His UDS and alcohol level were WNL. ABG showed pO2 of 38. He was profoundly short of breath but CXR was negative for acute cardiopulmonary infection. He was placed on vanco and zosyn for sepsis due to possible pneumonia. In addition, his CBG was 558 with CO2 less than 5 and ketones in urine c/w DKA.   Barrier to discharge: on Precedex drip which we will try to wean off today. Continue to monitor in SDU.    Assessment/Plan:    Principal Problem: Acute encephalopathy / Acute alcohol withdrawal /delirium - Mental status improving while patient is on Precedex drip. We will try to wean off of Precedex today. - Continue to monitor in step down unit. - Alcohol level was within normal limits on the admission. - We will continue supportive care with fluids.  Active Problems: DKA (diabetic ketoacidoses) / Diabetes mellitus, uncontrolled, normal gap metabolic acidosis  - Patient has ongoing acidosis, normal anion gap - A1c on this admission is 10.2 indicating poor glycemic control. Diabetic coordinator consulted and we appreciate their input. - We will continue insulin drip. Continue IV fluids. - CBG's in past 24 hours: 144, 153, 162  Sepsis due to possible pneumonia / Leukocytosis / acute respiratory failure with  hypoxia / fever of unknown origin - Patient continues to spike fever. Source remains a mystery. Chest x-ray and urinalysis without a sign of acute infection. - At this time I think it's reasonable to proceed with full workup of fever of unknown origin. I will obtain CT abdomen and chest with contrast for further evaluation. Lactic acid 1.5, procalcitonin level 3.75 - Chest x-ray from 11/19/2014 showed stable low bilateral lung volumes. - KUB with no clear evidence of bowel perforation or infection. - Blood cultures to date are negative - Legionella pending and strep pneumonia neg.   Hyponatremia - Likely from alcohol abuse and hyperglycemia - Sodium improving with IV fluids  Acute renal failure - Likely from alcohol abuse, sepsis - Creatinine normalized with IV fluids  Thrombocytopenia - Likely bone marrow suppression from alcohol abuse - Patient is on SCDs for DVT prophylaxis - No reports of bleeding - Continue to monitor CBC  Hypokalemia / hypomagnesemia - Due to DKA and alcohol abuse - Supplemented  DVT prophylaxis:  - SCD's bilaterally    Code Status: Full.  Family Communication:  plan of care discussed with the patient Disposition Plan: Remains in SDU considering he is on precedex drip   IV access:  Peripheral IV  Procedures and diagnostic studies:    Dg Chest 2 View 11/18/2014  No acute disease.   Electronically Signed   By: Drusilla Kanner M.D.   On: 11/18/2014 09:19   Dg Chest Port 1 View 11/19/2014   Stable low bilateral lung volumes.   Electronically Signed  By: Irish Lack M.D.   On: 11/19/2014 09:22   Dg Abd Portable 1v 11/19/2014 Negative exam.   Electronically Signed   By: Drusilla Kanner M.D.   On: 11/19/2014 09:23    Medical Consultants:  PCCM  Other Consultants:  Diabetic coordinator  IAnti-Infectives:   Vanc 7/23 >  Zosyn 7/23 >   [name], MD  [facility] Pager [phone]  Time spent in minutes: 25 minutes  If 7PM-7AM,  please contact night-coverage [email or web address] Password [email or web address] 11/20/2014, 11:08 AM   LOS: 2 days    HPI/Subjective: No acute overnight events. Patient reports feeling better this am.   Objective: Filed Vitals:   11/20/14 0720 11/20/14 0729 11/20/14 0800 11/20/14 0900  BP:   107/59 129/86  Pulse:  98 103 104  Temp: 97.9 F (36.6 C)     TempSrc: Oral     Resp:   18 35  Height:      Weight:      SpO2:   99% 100%    Intake/Output Summary (Last 24 hours) at 11/20/14 1108 Last data filed at 11/20/14 1039  Gross per 24 hour  Intake 4930.32 ml  Output   2275 ml  Net 2655.32 ml    Exam:   General:  Pt is alert, follows commands appropriately, not in acute distress  Cardiovascular: Regular rate and rhythm, S1/S2, no murmurs  Respiratory: Clear to auscultation bilaterally, no wheezing, no crackles, no rhonchi  Abdomen: Soft, non tender, non distended, bowel sounds present  Extremities: No edema, pulses DP and PT palpable bilaterally  Neuro: Grossly nonfocal  Data Reviewed: Basic Metabolic Panel:  Recent Labs Lab 11/19/14 0155 11/19/14 0722 11/19/14 1302 11/19/14 1906 11/20/14 0340  NA 131* 131* 133* 131* 132*  K 2.6* 2.6* 2.9* 2.9* 2.9*  CL 106 108 110 110 111  CO2 9* 12* 12* 13* 14*  GLUCOSE 182* 119* 163* 179* 176*  BUN 14 16 19 19 17   CREATININE 1.55* 1.29* 1.23 1.24 1.19  CALCIUM 8.9 8.8* 8.7* 8.5* 8.4*  MG  --  1.5*  --   --  2.0  PHOS  --   --  <1.0*  --  <0.1*   Liver Function Tests: No results for input(s): AST, ALT, ALKPHOS, BILITOT, PROT, ALBUMIN in the last 168 hours. No results for input(s): LIPASE, AMYLASE in the last 168 hours. No results for input(s): AMMONIA in the last 168 hours. CBC:  Recent Labs Lab 11/18/14 0524 11/19/14 0722 11/20/14 0340  WBC 21.6* 5.4 6.1  NEUTROABS 17.0* 4.1  --   HGB 16.5 14.2 12.7*  HCT 46.0 38.6* 34.5*  MCV 102.2* 95.3 94.0  PLT 232 106* 104*   Cardiac Enzymes: No results for input(s): CKTOTAL, CKMB,  CKMBINDEX, TROPONINI in the last 168 hours. BNP: Invalid input(s): POCBNP CBG:  Recent Labs Lab 11/20/14 0258 11/20/14 0410 11/20/14 0604 11/20/14 0828 11/20/14 0933  GLUCAP 196* 166* 144* 153* 162*    Recent Results (from the past 240 hour(s))  MRSA PCR Screening     Status: Abnormal   Collection Time: 11/18/14  9:00 AM  Result Value Ref Range Status   MRSA by PCR POSITIVE (A) NEGATIVE Final  Culture, blood (x 2)     Status: None (Preliminary result)   Collection Time: 11/18/14 10:10 AM  Result Value Ref Range Status   Specimen Description BLOOD LEFT ARM  Final   Special Requests BOTTLES DRAWN AEROBIC ONLY [facility]  Final   Culture   Final  NO GROWTH < 24 HOURS Performed at [facility]    Report Status PENDING  Incomplete  Culture, blood (x 2)     Status: None (Preliminary result)   Collection Time: 11/18/14 10:32 AM  Result Value Ref Range Status   Specimen Description BLOOD RIGHT HAND  Final   Special Requests BOTTLES DRAWN AEROBIC AND ANAEROBIC 10CC  Final   Culture   Final    NO GROWTH < 24 HOURS Performed at [facility]    Report Status PENDING  Incomplete     Scheduled Meds: . folic acid  1 mg Oral Daily  . multivitamin   1 tablet Oral Daily  . mupirocin ointment  1 application Nasal BID  . piperacillin-tazobactam (ZOSYN)  IV  3.375 g Intravenous Q8H  . potassium chloride  10 mEq Intravenous Q1 Hr x 6  . potassium phosphate IVPB (mmol)  30 mmol Intravenous Once  . thiamine  100 mg Oral Daily   Or  . thiamine  100 mg Intravenous Daily  . vancomycin  750 mg Intravenous Q12H   Continuous Infusions: . dextrose 5 % and 0.45 % NaCl with KCl 40 mEq/L 125 mL/hr (11/20/14 0840)  . insulin (NOVOLIN-R) infusion 4.5 mL/hr at 11/20/14 1039

## 2014-11-20 NOTE — Progress Notes (Signed)
ANTIBIOTIC CONSULT NOTE - FOLLOW UP  Pharmacy Consult for vancomycin, Zosyn Indication: r/o sepsis  No Known Allergies  Patient Measurements: Height:  (167.6 cm) Weight: 164 lb 0.4 oz (74.4 kg) IBW/kg (Calculated) : 63.8  Vital Signs: Temp: 97.7 F (36.5 C) (07/25 1145) Temp Source: Oral (07/25 1145) BP: 124/87 mmHg (07/25 1000) Pulse Rate: 104 (07/25 1000) Intake/Output from previous day: 07/24 0701 - 07/25 0700 In: 4840.4 [I.V.:2772.9; IV Piggyback:2067.5] Out: 1900 [Urine:1900] Intake/Output from this shift: Total I/O In: 1530.1 [I.V.:960.1; IV Piggyback:570] Out: 375 [Urine:375]  Labs:  Recent Labs  11/18/14 0524  11/19/14 0722 11/19/14 1302 11/19/14 1906 11/20/14 0340  WBC 21.6*  --  5.4  --   --  6.1  HGB 16.5  --  14.2  --   --  12.7*  PLT 232  --  106*  --   --  104*  CREATININE 1.61*  < > 1.29* 1.23 1.24 1.19  < > = values in this interval not displayed. Estimated Creatinine Clearance: 86.4 mL/min (by C-G formula based on Cr of 1.19). No results for input(s): VANCOTROUGH, VANCOPEAK, VANCORANDOM, GENTTROUGH, GENTPEAK, GENTRANDOM, TOBRATROUGH, TOBRAPEAK, TOBRARND, AMIKACINPEAK, AMIKACINTROU, AMIKACIN in the last 72 hours.   Microbiology: Recent Results (from the past 720 hour(s))  MRSA PCR Screening     Status: Abnormal   Collection Time: 11/18/14  9:00 AM  Result Value Ref Range Status   MRSA by PCR POSITIVE (A) NEGATIVE Final    Comment:        The GeneXpert MRSA Assay (FDA approved for NASAL specimens only), is one component of a comprehensive MRSA colonization surveillance program. It is not intended to diagnose MRSA infection nor to guide or monitor treatment for MRSA infections. CRITICAL RESULT CALLED TO, READ BACK BY AND VERIFIED WITH: A.ASHLEY AT 1207 ON 11/18/14 BY S.VANHOORNE   Culture, blood (x 2)     Status: None (Preliminary result)   Collection Time: 11/18/14 10:10 AM  Result Value Ref Range Status   Specimen Description BLOOD  LEFT ARM  Final   Special Requests BOTTLES DRAWN AEROBIC ONLY 7CC  Final   Culture   Final    NO GROWTH 2 DAYS Performed at [facility]    Report Status PENDING  Incomplete  Culture, blood (x 2)     Status: None (Preliminary result)   Collection Time: 11/18/14 10:32 AM  Result Value Ref Range Status   Specimen Description BLOOD RIGHT HAND  Final   Special Requests BOTTLES DRAWN AEROBIC AND ANAEROBIC 10CC  Final   Culture   Final    NO GROWTH 2 DAYS Performed at [facility]    Report Status PENDING  Incomplete    Anti-infectives    Start     Dose/Rate Route Frequency Ordered Stop   11/18/14 2200  vancomycin (VANCOCIN) IVPB 750 mg/150 ml premix  Status:  Discontinued     750 mg 150 mL/hr over 60 Minutes Intravenous Every 12 hours 11/18/14 0923 11/18/14 1543   11/18/14 2200  vancomycin (VANCOCIN) IVPB 750 mg/150 ml premix     750 mg 150 mL/hr over 60 Minutes Intravenous Every 12 hours 11/18/14 1557     11/18/14 2000  piperacillin-tazobactam (ZOSYN) IVPB 3.375 g     3.375 g 12.5 mL/hr over 240 Minutes Intravenous Every 8 hours 11/18/14 1557     11/18/14 1600  piperacillin-tazobactam (ZOSYN) IVPB 3.375 g  Status:  Discontinued     3.375 g 12.5 mL/hr over 240 Minutes Intravenous  Every 8 hours 11/18/14 0923 11/18/14 0934   11/18/14 1000  vancomycin (VANCOCIN) IVPB 1000 mg/200 mL premix     1,000 mg 200 mL/hr over 60 Minutes Intravenous  Once 11/18/14 0934 11/18/14 1155   11/18/14 1000  piperacillin-tazobactam (ZOSYN) IVPB 3.375 g  Status:  Discontinued     3.375 g 12.5 mL/hr over 240 Minutes Intravenous Every 8 hours 11/18/14 0934 11/18/14 1543   11/18/14 0845  piperacillin-tazobactam (ZOSYN) IVPB 3.375 g  Status:  Discontinued     3.375 g 100 mL/hr over 30 Minutes Intravenous  Once 11/18/14 0839 11/18/14 0934   11/18/14 0845  vancomycin (VANCOCIN) IVPB 1000 mg/200 mL premix  Status:  Discontinued     1,000 mg 200 mL/hr over 60 Minutes Intravenous  Once 11/18/14  1610 11/18/14 0933      Assessment: 23 yoM presents to ED on 7/23 with SOB and noted to be in DKA.  Pharmacy is consulted to dose vancomycin and Zosyn for suspected sepsis.  PCT elevated; LA wnl Temp: 101.1, now afebrile WBC: improved to wnl Renal: AKI resolved; CrCl 86 CG  7/23 >> Vanc >> 7/23 >> Zosyn >>    7/23 MRSA PCR: positive > bactroban 7/23 blood x2: ngtd 7/24 sputum:  7/25 blood: IP S. pneumo UAg: neg Legionella UAg: IP    Goal of Therapy:  Vancomycin trough level 15-20 mcg/ml  Eradication of infection Appropriate antibiotic dosing for indication and renal function  Plan:  Day 3 antibiotics  Continue Zosyn 3.375 g IV given every 8 hrs by 4-hr infusion and vancomycin 750 mg IV q12 hr.  Qualifies for q8h vancomycin dosing with improved renal function, but will check trough tonight prior to 6th dose before adjusting.  Follow clinical course, renal function, culture results as available  Follow for de-escalation of antibiotics and LOT.  Expect broad spectrum abx to continue for at least another day with continued fevers and unexplained abdominal pain.   Bernadene Person, PharmD, BCPS Pager: [phone] 11/20/2014, 2:31 PM

## 2014-11-20 NOTE — Progress Notes (Signed)
PULMONARY / CRITICAL CARE MEDICINE   Name: Christopher Robles MRN: [id number] DOB: February 08, 1991    ADMISSION DATE:  11/18/2014 CONSULTATION DATE:  11/18/2014  REFERRING MD :  Dr. Elisabeth Pigeon  CHIEF COMPLAINT:  Agitation/EtOH withdrawal/DKA  INITIAL PRESENTATION:  32M with h/o DM-1 and alcohol abuse who presented to the ED with acute encephalopathy and SOB.     SUBJECTIVE:  Pt reports feeling better, denies symptoms of withdrawal, SOB and pain.  RN reports precedex gtt turned off approx 30 minutes ago.  Patient reports exceptionally bright colors with vision - denies blurry vision or blindness.    VITAL SIGNS: Temp:  [97.9 F (36.6 C)-101.1 F (38.4 C)] 97.9 F (36.6 C) (07/25 0720) Pulse Rate:  [86-123] 104 (07/25 0900) Resp:  [18-38] 35 (07/25 0900) BP: (91-130)/(51-87) 129/86 mmHg (07/25 0900) SpO2:  [98 %-100 %] 100 % (07/25 0900) Weight:  [164 lb 0.4 oz (74.4 kg)] 164 lb 0.4 oz (74.4 kg) (07/25 0437)   INTAKE / OUTPUT:  Intake/Output Summary (Last 24 hours) at 11/20/14 0914 Last data filed at 11/20/14 0900  Gross per 24 hour  Intake 5123.57 ml  Output   1900 ml  Net 3223.57 ml   PHYSICAL EXAMINATION: General:  WDWN male in NAD Neuro:  AAOx4, speech clear, MAE HEENT:  NCAT.  Dry MM Cardiovascular:  Tachycardic, regular.  No m/r/g Lungs:  Respirations even/non-labored, lungs bilaterally clear throughout Abdomen:  NTND, bsx4 active Musculoskeletal:  Moves all extremities, no acute deformities  Skin:  No rashes, lesions   LABS:  CBC  Recent Labs Lab 11/18/14 0524 11/19/14 0722 11/20/14 0340  WBC 21.6* 5.4 6.1  HGB 16.5 14.2 12.7*  HCT 46.0 38.6* 34.5*  PLT 232 106* 104*   Coag's No results for input(s): APTT, INR in the last 168 hours.   BMET  Recent Labs Lab 11/19/14 1302 11/19/14 1906 11/20/14 0340  NA 133* 131* 132*  K 2.9* 2.9* 2.9*  CL 110 110 111  CO2 12* 13* 14*  BUN CREATININE 1.23 1.24 1.19  GLUCOSE 163* 179* 176*    Electrolytes  Recent Labs Lab 11/19/14 0722 11/19/14 1302 11/19/14 1906 11/20/14 0340  CALCIUM 8.8* 8.7* 8.5* 8.4*  MG 1.5*  --   --  2.0  PHOS  --  <1.0*  --  <0.1*   Sepsis Markers  Recent Labs Lab 11/18/14 0842 11/18/14 1010 11/18/14 1335  LATICACIDVEN  --  <0.3* 1.5  PROCALCITON 3.75  --   --    ABG  Recent Labs Lab 11/18/14 1648 11/18/14 2145  PHART 7.139* 7.217*  PCO2ART 11.1* 11.3*  PO2ART 112* 120*   Liver Enzymes No results for input(s): AST, ALT, ALKPHOS, BILITOT, ALBUMIN in the last 168 hours.   Cardiac Enzymes No results for input(s): TROPONINI, PROBNP in the last 168 hours.   Glucose  Recent Labs Lab 11/19/14 2359 11/20/14 0058 11/20/14 0158 11/20/14 0258 11/20/14 0410 11/20/14 0604  GLUCAP 128* 171* 139* 196* 166* 144*   Imaging Dg Chest Port 1 View  11/19/2014   CLINICAL DATA:  Dyspnea and abdominal pain.  EXAM: PORTABLE CHEST - 1 VIEW  COMPARISON:  11/18/2014  FINDINGS: Stable low bilateral lung volumes with bibasilar atelectasis. The heart size and mediastinal contours are normal. There is no evidence of pulmonary edema, consolidation, pneumothorax, nodule or pleural fluid.  IMPRESSION: Stable low bilateral lung volumes.   Electronically Signed   By: Irish Lack M.D.   On: 11/19/2014 09:22   Dg  Abd Portable 1v  11/19/2014   CLINICAL DATA:  Dyspnea and abdominal pain today. Initial encounter.  EXAM: PORTABLE ABDOMEN - 1 VIEW  COMPARISON:  None.  FINDINGS: The bowel gas pattern is nonobstructive. Moderate stool burden throughout the colon is noted. No abnormal abdominal calcification is seen. No bony abnormality is identified.  IMPRESSION: Negative exam.   Electronically Signed   By: Drusilla Kanner M.D.   On: 11/19/2014 09:23   ASSESSMENT / PLAN: 24 y/o male with acute toxic/metabolic encephalopathy 2/2 EtOH w/d and DKA.  PULMONARY A:  Tachypnea - compensatory for metabolic acidosis, resolving P:   Monitor respiratory  status Pulmonary hygiene Titrate O2 as needed for sat of 88-92%. Intermittent CXR for new symptoms  CARDIOVASCULAR A:  Tachycardia - secondary to EtOH withdrawal and hypersympathetic response. P:  Tele monitoring  CIWA protocol for withdrawal   RENAL A:   Profound AGMA/NAGMA with incomplete respiratory compensation.  2/2 DKA likely with EtOH ketosis contribution.  Mildly improving with volume resuscitation.   AKI - in setting of hypovolemia. Hypokalemia. Hypophosphatemia  P:   Trend BMP  Replace electrolytes as indicated. Repeat Phos, K, Mag @ 1900 and in am  D5/12 NS at 125 ml/hr  INFECTIOUS A:   SIRS, no suspected source of infection at this time.  CXR had opacity on RLL posteriorly on admit but no other clear source.  Film since resolved.  P:   BCx2 7/23 >>  Vanco, start date 7/23 >>  Zosyn, start date7/23 >> Monitor PCT   HEMATOLOGIC  A:  Thrombocytopenia  P: Monitor CBC SCD's for DVT prophylaxis   ENDOCRINE A:   DKA - BG under control on insulin gtt.  AG resolved. Lactate negative.  Suspect precipitating event may have been intoxication with non-adherance P:   Continue insulin gtt  D5/12 NS @ 125 ml/hr DKA protocol   GI A: At Risk for Re-Feeding Syndrome - in setting of ETOH & profound hypophosphatemia  P: Consult Nutrition  Monitor GI intake closely  PRN zofran   NEUROLOGIC / PSY A:   Acute toxic/metabolic encephalopathy ETOH Withdrawal Hallucinations - pt reports he began having hallucinations approximately 7 months ago and that is why he began drinking.  ?? New dx of schizophrenia P:   RASS goal: 0 Continue CIWA with lorazepam D/C Precedex  Tx underlying causes as above. Thiamine, folate, MVI Consider PSY consult for possible schizophrenia    OK to transfer out of ICU this afternoon if remains off precedex gtt.  Will defer to primary SVC.  PCCM will be available PRN.  Please call back if new needs arise.    Canary Brim, NP-C [facility] Pgr: [phone] or if no answer [phone] 11/20/2014, 9:15 AM

## 2014-11-21 ENCOUNTER — Encounter (HOSPITAL_COMMUNITY): Payer: Self-pay | Admitting: Infectious Diseases

## 2014-11-21 DIAGNOSIS — F10231 Alcohol dependence with withdrawal delirium: Secondary | ICD-10-CM

## 2014-11-21 DIAGNOSIS — E1010 Type 1 diabetes mellitus with ketoacidosis without coma: Secondary | ICD-10-CM

## 2014-11-21 DIAGNOSIS — R509 Fever, unspecified: Secondary | ICD-10-CM

## 2014-11-21 DIAGNOSIS — N179 Acute kidney failure, unspecified: Secondary | ICD-10-CM

## 2014-11-21 LAB — GLUCOSE, CAPILLARY
GLUCOSE-CAPILLARY: 137 mg/dL — AB (ref 65–99)
GLUCOSE-CAPILLARY: 181 mg/dL — AB (ref 65–99)
GLUCOSE-CAPILLARY: 142 mg/dL — AB (ref 65–99)
GLUCOSE-CAPILLARY: 167 mg/dL — AB (ref 65–99)
GLUCOSE-CAPILLARY: 215 mg/dL — AB (ref 65–99)
GLUCOSE-CAPILLARY: 143 mg/dL — AB (ref 65–99)
GLUCOSE-CAPILLARY: 167 mg/dL — AB (ref 65–99)
GLUCOSE-CAPILLARY: 179 mg/dL — AB (ref 65–99)
Glucose-Capillary: 145 mg/dL — ABNORMAL HIGH (ref 65–99)
Glucose-Capillary: 163 mg/dL — ABNORMAL HIGH (ref 65–99)
Glucose-Capillary: 166 mg/dL — ABNORMAL HIGH (ref 65–99)
Glucose-Capillary: 171 mg/dL — ABNORMAL HIGH (ref 65–99)
Glucose-Capillary: 178 mg/dL — ABNORMAL HIGH (ref 65–99)
Glucose-Capillary: 182 mg/dL — ABNORMAL HIGH (ref 65–99)
Glucose-Capillary: 131 mg/dL — ABNORMAL HIGH (ref 65–99)
Glucose-Capillary: 133 mg/dL — ABNORMAL HIGH (ref 65–99)
Glucose-Capillary: 145 mg/dL — ABNORMAL HIGH (ref 65–99)
Glucose-Capillary: 153 mg/dL — ABNORMAL HIGH (ref 65–99)
Glucose-Capillary: 183 mg/dL — ABNORMAL HIGH (ref 65–99)
Glucose-Capillary: 121 mg/dL — ABNORMAL HIGH (ref 65–99)
Glucose-Capillary: 162 mg/dL — ABNORMAL HIGH (ref 65–99)

## 2014-11-21 LAB — CBC
HEMATOCRIT: 33.6 % — AB (ref 39.0–52.0)
HEMOGLOBIN: 12.4 g/dL — AB (ref 13.0–17.0)
MCH: 35 pg — ABNORMAL HIGH (ref 26.0–34.0)
MCHC: 36.9 g/dL — ABNORMAL HIGH (ref 30.0–36.0)
MCV: 94.9 fL (ref 78.0–100.0)
Platelets: 116 10*3/uL — ABNORMAL LOW (ref 150–400)
RBC: 3.54 MIL/uL — AB (ref 4.22–5.81)
RDW: 13.2 % (ref 11.5–15.5)
WBC: 7.3 10*3/uL (ref 4.0–10.5)

## 2014-11-21 LAB — BASIC METABOLIC PANEL
Anion gap: 10 (ref 5–15)
Anion gap: 7 (ref 5–15)
BUN: 11 mg/dL (ref 6–20)
BUN: 11 mg/dL (ref 6–20)
CALCIUM: 8.4 mg/dL — AB (ref 8.9–10.3)
CO2: 17 mmol/L — ABNORMAL LOW (ref 22–32)
CO2: 18 mmol/L — ABNORMAL LOW (ref 22–32)
CREATININE: 1.04 mg/dL (ref 0.61–1.24)
Calcium: 8.5 mg/dL — ABNORMAL LOW (ref 8.9–10.3)
Chloride: 110 mmol/L (ref 101–111)
Chloride: 108 mmol/L (ref 101–111)
Creatinine, Ser: 1.23 mg/dL (ref 0.61–1.24)
GFR calc Af Amer: >60 mL/min (ref >60)
GFR calc non Af Amer: >60 mL/min (ref >60)
Glucose, Bld: 137 mg/dL — ABNORMAL HIGH (ref 65–99)
Glucose, Bld: 147 mg/dL — ABNORMAL HIGH (ref 65–99)
POTASSIUM: 2.6 mmol/L — AB (ref 3.5–5.1)
Potassium: 2.9 mmol/L — ABNORMAL LOW (ref 3.5–5.1)
Sodium: 137 mmol/L (ref 135–145)
Sodium: 133 mmol/L — ABNORMAL LOW (ref 135–145)

## 2014-11-21 LAB — BLOOD GAS, VENOUS
Bicarbonate: 3.6 mEq/L — ABNORMAL LOW (ref 20.0–24.0)
O2 Saturation: 70.6 %
PH VEN: 6.925 — AB (ref 7.250–7.300)
PO2 VEN: 38.3 mmHg (ref 30.0–45.0)
Patient temperature: 98.6
TCO2: 3.6 mmol/L (ref 0–100)
pCO2, Ven: 18.2 mmHg — ABNORMAL LOW (ref 45.0–50.0)

## 2014-11-21 LAB — LIPASE, BLOOD: Lipase: 50 U/L (ref 22–51)

## 2014-11-21 LAB — LEGIONELLA ANTIGEN, URINE

## 2014-11-21 LAB — MAGNESIUM: Magnesium: 1.6 mg/dL — ABNORMAL LOW (ref 1.7–2.4)

## 2014-11-21 LAB — PHOSPHORUS: Phosphorus: 1.3 mg/dL — ABNORMAL LOW (ref 2.5–4.6)

## 2014-11-21 LAB — AMYLASE: Amylase: 45 U/L (ref 28–100)

## 2014-11-21 MED ORDER — PANTOPRAZOLE SODIUM 40 MG IV SOLR
40.0000 mg | Freq: Two times a day (BID) | INTRAVENOUS | Status: DC
  Administered 2014-11-21 – 2014-11-23 (×4): 40 mg via INTRAVENOUS
  Filled 2014-11-21 (×4): qty 40

## 2014-11-21 MED ORDER — INSULIN ASPART 100 UNIT/ML ~~LOC~~ SOLN
0.0000 [IU] | Freq: Every day | SUBCUTANEOUS | Status: DC
  Administered 2014-11-21: 3 [IU] via SUBCUTANEOUS

## 2014-11-21 MED ORDER — CHLORHEXIDINE GLUCONATE CLOTH 2 % EX PADS
6.0000 | MEDICATED_PAD | Freq: Every day | CUTANEOUS | Status: AC
  Administered 2014-11-21 – 2014-11-23 (×3): 6 via TOPICAL

## 2014-11-21 MED ORDER — VANCOMYCIN HCL IN DEXTROSE 1-5 GM/200ML-% IV SOLN
1000.0000 mg | Freq: Two times a day (BID) | INTRAVENOUS | Status: DC
  Administered 2014-11-21: 1000 mg via INTRAVENOUS
  Filled 2014-11-21: qty 200

## 2014-11-21 MED ORDER — GI COCKTAIL ~~LOC~~
30.0000 mL | Freq: Once | ORAL | Status: AC
  Administered 2014-11-21: 30 mL via ORAL
  Filled 2014-11-21: qty 30

## 2014-11-21 MED ORDER — INSULIN ASPART 100 UNIT/ML ~~LOC~~ SOLN
0.0000 [IU] | Freq: Three times a day (TID) | SUBCUTANEOUS | Status: DC
  Administered 2014-11-21: 4 [IU] via SUBCUTANEOUS

## 2014-11-21 MED ORDER — INSULIN GLARGINE 100 UNIT/ML ~~LOC~~ SOLN
5.0000 [IU] | Freq: Once | SUBCUTANEOUS | Status: AC
Start: 2014-11-21 — End: 2014-11-21
  Administered 2014-11-21: 5 [IU] via SUBCUTANEOUS
  Filled 2014-11-21 (×2): qty 0.05

## 2014-11-21 MED ORDER — POTASSIUM CHLORIDE CRYS ER 20 MEQ PO TBCR
40.0000 meq | EXTENDED_RELEASE_TABLET | Freq: Two times a day (BID) | ORAL | Status: AC
  Administered 2014-11-21 (×2): 40 meq via ORAL
  Filled 2014-11-21 (×2): qty 2

## 2014-11-21 MED ORDER — POTASSIUM CHLORIDE 10 MEQ/100ML IV SOLN
10.0000 meq | INTRAVENOUS | Status: AC
  Administered 2014-11-21 (×4): 10 meq via INTRAVENOUS
  Filled 2014-11-21 (×4): qty 100

## 2014-11-21 MED ORDER — LORAZEPAM 2 MG/ML IJ SOLN
1.0000 mg | Freq: Four times a day (QID) | INTRAMUSCULAR | Status: DC | PRN
Start: 2014-11-21 — End: 2014-11-24

## 2014-11-21 NOTE — Progress Notes (Signed)
Report called to [facility] room 1443 will transport via wheel chair.

## 2014-11-21 NOTE — Progress Notes (Signed)
Initial Nutrition Assessment  DOCUMENTATION CODES:   Not applicable  INTERVENTION:  - RD will continue to monitor for needs, especially with diet advancement  NUTRITION DIAGNOSIS:   Inadequate oral intake related to inability to eat as evidenced by NPO status.  GOAL:   Patient will meet greater than or equal to 90% of their needs  MONITOR:   Diet advancement, PO intake, Weight trends, Labs, I & O's  REASON FOR ASSESSMENT:   Malnutrition Screening Tool, Consult Assessment of nutrition requirement/status  ASSESSMENT:  24 year old male with past medical history of diabetes mellitus who presented to [facility] ED with altered mental status. He is not a good historian at this time due to his AMS, hallucinations and respiratory distress. Per ED report patient has had worsening shortness of breath over past 24 hours but no associated fevers or chills or cough. No abdominal pain, nausea or vomiting. His last alcoholic beverage was about few days prior to this admission.   Pt seen for MST and consult for pt at risk for re-feeding syndrome. BMI indicates overweight status. Pt NPO but documented that he is able to have water, diet soda, and sugar-free jello. Pt states that he has had ice water this AM without issue and that he may try the jello later this AM/afternoon.  PTA, he indicates that he was drinking heavily every day x8-10 months and that during that time he would force himself to eat something such as a sandwich but otherwise did not eat throughout the day. He indicates that he has not had anything to eat the past 6 days and that the day before admission (7/22, 4 days ago) he began to feel severe heartburn that caused nausea and vomiting. He attempted to drink to alleviate this feeling but it only got worse. Pt states he was trying to wean himself from alcohol and feels that heartburn was due to withdrawal.   He states that his UBW is ~160 lbs and that he has been losing a significant amount  of weight. Family member in the room indicates that pt has been losing weight since February. Weight from yesterday 164 lbs which is consistent with reported UBW. No muscle or fat wasting was noted during visit.   Per chart, pt has hx of Type 1 DM. Family member and pt often talked amongst themselves while RD in the room and pt stating that he wants to ask social worker about rehabilitation for alcohol abuse and family member states that pt will also need to learn more about insulin regimen and eating well with DM. Pt was weaned off of Precedex and mentation continues to improve but education is not appropriate at this time; will continue to monitor for ability to educate versus need for outpatient education as that setting may be more appropriate for pt's educational needs.  Not meeting needs. Medications reviewed; KCl: 10 mEq, 1 hr x6; KPhos: 30 mmol @ 85 mL/hr. Labs reviewed; CBGs: 131-182 mg/dL, Ca: 8.4 mg/dL. All "refeeding" labs low at this time. K: 2.6 mmol/L, Phos: 1.3 mg/dL, Mg: 1.6 mg/dL.   Diet Order:  Diet NPO time specified  Skin:  Reviewed, no issues  Last BM:  7/24  Height:   Ht Readings from Last 1 Encounters:  11/18/14  (1.676 m)    Weight:   Wt Readings from Last 1 Encounters:  11/20/14 164 lb 0.4 oz (74.4 kg)    Ideal Body Weight:  64.54 kg (kg)  Wt Readings from Last 10 Encounters:  11/20/14 164 lb 0.4 oz (74.4 kg)  12/29/13 151 lb 3.2 oz (68.584 kg)  12/19/13 140 lb 12.8 oz (63.866 kg)  12/19/13 139 lb 9.6 oz (63.322 kg)    BMI:  Body mass index is 26.49 kg/(m^2).  Estimated Nutritional Needs:   Kcal:  1850-2050  Protein:  75-85 grams  Fluid:  2.2 L/day  EDUCATION NEEDS:   No education needs identified at this time     Trenton Gammon, RD, LDN Inpatient Clinical Dietitian Pager # [phone] After hours/weekend pager # [phone]

## 2014-11-21 NOTE — Progress Notes (Signed)
CRITICAL VALUE ALERT  Critical value received:  K 2.6  Date of notification:  11/20/2013  Time of notification:  0442  Critical value read back:Yes.    Nurse who received alert:  Effie Berkshire  MD notified (1st page):  K Schorr  Time of first page:  [phone]  MD notified (2nd page):  Time of second page:  Responding MD:  Merdis Delay  Time MD responded:  [phone]

## 2014-11-21 NOTE — Progress Notes (Signed)
Patient ID: Christopher Robles, male   DOB: July 13, 1990, 24 y.o.   MRN: [id number] [facility] PROGRESS NOTE  ORMAN MATSUMURA UYQ:[id number] DOB: [birth year] DOA: 11/18/2014 PCP: Doris Cheadle, MD  Brief narrative:    24 year old male with past medical history of diabetes mellitus who presented to [facility] ED with altered mental status and respiratory distress. Per ED report, the patient had worsening shortness of breath over the past 24 hours prior to this admission but no associated fevers or chills or cough, abdominal pain, nausea or vomiting.  In ED, BP was 124/57, HR was 124, RR 27-35, oxygen saturation was 89-100% on room air. Blood work showed WBC count 21.6, sodium 128, creatinine 1.61. His UDS and alcohol level were WNL. ABG showed pO2 of 38. He was profoundly short of breath but CXR was negative for acute cardiopulmonary infection. He was placed on vanco and zosyn for sepsis due to possible pneumonia. In addition, his CBG was 558 with CO2 less than 5 and ketones in urine c/w DKA.   Barrier to discharge: Patient is still spiking fevers. CT abdomen and chest with findings consistent with duodenitis or possible inflammatory bowel disease. I consulted infectious disease for further input on management. Patient is still on vancomycin and Zosyn. Patient will remain in SDU while he requires insulin drip.    Assessment/Plan:    Principal Problem: Acute encephalopathy / Acute alcohol withdrawal /delirium - Patient required Precedex drip. Precedex drip stopped 11/20/2014 - Mental status significantly improved since the admission. - Alcohol level was within normal limits on the admission.  Active Problems: DKA (diabetic ketoacidoses) / Diabetes mellitus, uncontrolled, normal gap metabolic acidosis  - Patient has ongoing acidosis, normal anion gap - A1c on this admission is 10.2 indicating poor glycemic control.  - Diabetic coordinator consulted and we appreciate their input. - We will continue  insulin drip. His MP is pending so if CO2 is closer to normal values will transition the pt to subQ insulin   Sepsis due to possible pneumonia / Leukocytosis / acute respiratory failure with hypoxia / fever of unknown origin - Patient continues to spike fever. Source remains a mystery. Chest x-ray and urinalysis without a sign of acute infection. Lactic acid 1.5, procalcitonin level 3.75. - CT abdomen and chest was done without contrast because patient had episode of emesis after trying to drink the contrast. The findings were consistent with possible duodenitis or inflammatory bowel disease. - Chest x-ray from 11/19/2014 showed stable low bilateral lung volumes. - KUB with no clear evidence of bowel perforation or infection. - Blood cultures to date are negative - Legionella pending and strep pneumonia neg.  - We will continue current anti-biotics. I appreciate infectious disease consult and recommendations. -Will check HIV, acute hepatitis panel   Hyponatremia - Likely from alcohol abuse and hyperglycemia - Sodium normalized with IV fluids  Acute renal failure - Likely from alcohol abuse, sepsis - Creatinine normalized with IV fluids  Thrombocytopenia - Likely bone marrow suppression from alcohol abuse - Patient is on SCDs for DVT prophylaxis - Platelet count is improving, 116 today  Hypokalemia / hypomagnesemia - Due to DKA and alcohol abuse - Supplemented  DVT prophylaxis:  - SCD's bilaterally    Code Status: Full.  Family Communication:  plan of care discussed with the patient Disposition Plan: Patient will remain in step down unit while on insulin drip. As soon as he is off of insulin drip will transition to telemetry floor.  IV access:  Peripheral IV  Procedures and diagnostic studies:    Dg Chest 2 View 11/18/2014  No acute disease.   Electronically Signed   By: Drusilla Kanner M.D.   On: 11/18/2014 09:19   Dg Chest Port 1 View 11/19/2014   Stable low bilateral lung  volumes.   Electronically Signed   By: Irish Lack M.D.   On: 11/19/2014 09:22   Dg Abd Portable 1v 11/19/2014 Negative exam.   Electronically Signed   By: Drusilla Kanner M.D.   On: 11/19/2014 09:23    Medical Consultants:  PCCM Infectious disease, Dr. Enedina Finner   Other Consultants:  Diabetic coordinator  IAnti-Infectives:   Vanc 7/23 >  Zosyn 7/23 >   [name], MD  [facility] Pager [phone]  Time spent in minutes: 25 minutes  If 7PM-7AM, please contact night-coverage [email or web address] Password [email or web address] 11/21/2014, 12:22 PM   LOS: 3 days    HPI/Subjective: No acute overnight events. Patient reports slight nausea otherwise feeling okay.  Objective: Filed Vitals:   11/20/14 2000 11/21/14 0000 11/21/14 0400 11/21/14 0800  BP: 131/94 131/88 131/103   Pulse: 122 117 93   Temp: 97.9 F (36.6 C) 98.2 F (36.8 C) 97.9 F (36.6 C) 100.7 F (38.2 C)  TempSrc: Oral Oral Oral Oral  Resp: Height:      Weight:      SpO2: 99% 99% 100%     Intake/Output Summary (Last 24 hours) at 11/21/14 1222 Last data filed at 11/21/14 1109  Gross per 24 hour  Intake 3277.04 ml  Output   3175 ml  Net 102.04 ml    Exam:   General:  Pt is alert, much better mental status, no distress  Cardiovascular: Appreciate S1-S2, rate controlled  Respiratory: Bilateral air entry, no wheezing  Abdomen: Nontender abdomen, nondistended, appreciate bowel sounds  Extremities: No leg swelling, pulses palpable  Neuro: No focal deficits  Data Reviewed: Basic Metabolic Panel:  Recent Labs Lab 11/19/14 0722 11/19/14 1302 11/19/14 1906 11/20/14 0340 11/20/14 2100 11/21/14 0336  NA 131* 133* 131* 132*  --  137  K 2.6* 2.9* 2.9* 2.9*  --  2.6*  CL 108 110 110 111  --  110  CO2 12* 12* 13* 14*  --  17*  GLUCOSE 119* 163* 179* 176*  --  137*  BUN --  11  CREATININE 1.29* 1.23 1.24 1.19  --  1.04  CALCIUM 8.8* 8.7* 8.5* 8.4*  --  8.4*  MG 1.5*  --    --  2.0 1.7 1.6*  PHOS  --  <1.0*  --  <0.1* 1.6* 1.3*   Liver Function Tests: No results for input(s): AST, ALT, ALKPHOS, BILITOT, PROT, ALBUMIN in the last 168 hours. No results for input(s): LIPASE, AMYLASE in the last 168 hours. No results for input(s): AMMONIA in the last 168 hours. CBC:  Recent Labs Lab 11/18/14 0524 11/19/14 0722 11/20/14 0340 11/21/14 0336  WBC 21.6* 5.4 6.1 7.3  NEUTROABS 17.0* 4.1  --   --   HGB 16.5 14.2 12.7* 12.4*  HCT 46.0 38.6* 34.5* 33.6*  MCV 102.2* 95.3 94.0 94.9  PLT 232 106* 104* 116*   Cardiac Enzymes: No results for input(s): CKTOTAL, CKMB, CKMBINDEX, TROPONINI in the last 168 hours. BNP: Invalid input(s): POCBNP CBG:  Recent Labs Lab 11/21/14 0652 11/21/14 0801 11/21/14 0909 11/21/14 1000 11/21/14 1106  GLUCAP 153* 142* 167* 215* 183*    Results for  orders placed or performed during the hospital encounter of 11/18/14  MRSA PCR Screening     Status: Abnormal   Collection Time: 11/18/14  9:00 AM  Result Value Ref Range Status   MRSA by PCR POSITIVE (A) NEGATIVE Final  Culture, blood (x 2)     Status: None (Preliminary result)   Collection Time: 11/18/14 10:10 AM  Result Value Ref Range Status   Specimen Description BLOOD LEFT ARM  Final   Special Requests BOTTLES DRAWN AEROBIC ONLY 7CC  Final   Culture   Final    NO GROWTH 2 DAYS Performed at [facility]    Report Status PENDING  Incomplete  Culture, blood (x 2)     Status: None (Preliminary result)   Collection Time: 11/18/14 10:32 AM  Result Value Ref Range Status   Specimen Description BLOOD RIGHT HAND  Final   Special Requests BOTTLES DRAWN AEROBIC AND ANAEROBIC 10CC  Final   Culture   Final    NO GROWTH 2 DAYS Performed at [facility]    Report Status PENDING  Incomplete    . antiseptic oral rinse  7 mL Mouth Rinse q12n4p  . chlorhexidine  15 mL Mouth Rinse BID  . Chlorhexidine Gluconate Cloth  6 each Topical Q0600  . folic acid  1 mg Oral  Daily  . multivitamin with minerals  1 tablet Oral Daily  . mupirocin ointment  1 application Nasal BID  . piperacillin-tazobactam (ZOSYN)  IV  3.375 g Intravenous Q8H  . potassium chloride  40 mEq Oral BID  . thiamine  100 mg Oral Daily   Or  . thiamine  100 mg Intravenous Daily  . vancomycin  1,000 mg Intravenous Q12H

## 2014-11-21 NOTE — Consult Note (Addendum)
[facility]  Date of Admission:  11/18/2014  Date of Consult:  11/21/2014  Reason for Consult: fever, antibiotic management Referring Physician: Elisabeth Pigeon  Impression/Recommendation Fever Possible duodenitis? Consider upper endoscopy? Possible aspiration? Await his repeat BCx, UCx. UTI seems less likely. Has had 3 loose BM today but suspect this is from his constipation for last 5 days. If his loose BM cont, will chcek C diff.  Will stop vanco as we do not have evidence of gram positive infection and fever started after he was started on anxb (? Drug fever).  Await his hepatitis serologies as well as HIV.  Check amylase/lipase  Drug monitoring His Cr is improving while on vanco.  His vanco trough was slightly low  DKA Glc improved, CO2 nearly normalized.  apprecaite diabetic coordinator A1C 10.2 on admission, will need improved control and f/u as outpt.   ARF Cr now 1.23, improving with hydration. Will continue current plan.  Baseline Cr 0.4  Alcohol Withdrawal Continue CIWA His mental status has cleared He will need f/u with AA  His mother is updated  Thank you so much for this interesting consult,   Johny Sax (pager) [phone] [email or web address]  Christopher Robles is an 24 y.o. male.  HPI: 24 yo M with hx of uncontrolled DM1 comes to hospital on 7-23 with CHIEF COMPLAINT of confusion, difficulty breathing. The patient was brought to the ED due to mental status change, hallucinations and respiratory distress noted over 24h. He was not felt to need intubation on admission, his CXR was (-), he was afebrile, but was noted to have WBC 21.6. His Glc was 558 and his CO2 was < 5. He was also noted to have acute renal failure with a Cr of 1.61. He was felt to meet sepsis criteria by his tachycardia, lactic acid, tachycardia, tachypnea and leukocytosis.  He was started on vanco/zosyn and an insulin drip.  He was placed on CIWA protocol but his  Sochx does not list ETOH use. His serum ETOH was (-) on adm. Pt states that he had been drinking heavily prior to adm, stopped 3 days prior. Had significant emesis as well has heart burn.  He developed temp 101.3 within 24h of adm and since has had further fevers (Tmax last 24h 100.7).  His BCx from adm were (-), his UA was not consistent with infection.  As part of his fever work up, he had CT 7-25:  1. Bilateral pleural effusions and bibasilar atelectasis. 2. Significant edema of the duodenum wall, associated with retroperitoneal fluid. There is no evidence for perforation or abscess. Findings are consistent duodenitis. Consider inflammatory bowel disease. 3. Nonspecific perfusion abnormality of the liver. Also suspect hepatic steatosis. 4. Small amount of free pelvic fluid. 5. Distended urinary bladder. Repeat BCx and UCx pending from 7-25  Past Medical History  Diagnosis Date  . Anxiety   . Seizure   . DM I (diabetes mellitus, type I), uncontrolled     Past Surgical History  Procedure Laterality Date  . Appendectomy       No Known Allergies  Medications:  Scheduled: . antiseptic oral rinse  7 mL Mouth Rinse q12n4p  . chlorhexidine  15 mL Mouth Rinse BID  . Chlorhexidine Gluconate Cloth  6 each Topical Q0600  . folic acid  1 mg Oral Daily  . multivitamin with minerals  1 tablet Oral Daily  . mupirocin ointment  1 application Nasal BID  . piperacillin-tazobactam (ZOSYN)  IV  3.375  g Intravenous Q8H  . potassium chloride  40 mEq Oral BID  . thiamine  100 mg Oral Daily   Or  . thiamine  100 mg Intravenous Daily  . vancomycin  1,000 mg Intravenous Q12H    Abtx:  Anti-infectives    Start     Dose/Rate Route Frequency Ordered Stop   11/21/14 0800  vancomycin (VANCOCIN) IVPB 1000 mg/200 mL premix     1,000 mg 200 mL/hr over 60 Minutes Intravenous Every 12 hours 11/21/14 0526     11/18/14 2200  vancomycin (VANCOCIN) IVPB 750 mg/150 ml premix  Status:  Discontinued     750  mg 150 mL/hr over 60 Minutes Intravenous Every 12 hours 11/18/14 0923 11/18/14 1543   11/18/14 2200  vancomycin (VANCOCIN) IVPB 750 mg/150 ml premix  Status:  Discontinued     750 mg 150 mL/hr over 60 Minutes Intravenous Every 12 hours 11/18/14 1557 11/21/14 0525   11/18/14 2000  piperacillin-tazobactam (ZOSYN) IVPB 3.375 g     3.375 g 12.5 mL/hr over 240 Minutes Intravenous Every 8 hours 11/18/14 1557     11/18/14 1600  piperacillin-tazobactam (ZOSYN) IVPB 3.375 g  Status:  Discontinued     3.375 g 12.5 mL/hr over 240 Minutes Intravenous Every 8 hours 11/18/14 0923 11/18/14 0934   11/18/14 1000  vancomycin (VANCOCIN) IVPB 1000 mg/200 mL premix     1,000 mg 200 mL/hr over 60 Minutes Intravenous  Once 11/18/14 0934 11/18/14 1155   11/18/14 1000  piperacillin-tazobactam (ZOSYN) IVPB 3.375 g  Status:  Discontinued     3.375 g 12.5 mL/hr over 240 Minutes Intravenous Every 8 hours 11/18/14 0934 11/18/14 1543   11/18/14 0845  piperacillin-tazobactam (ZOSYN) IVPB 3.375 g  Status:  Discontinued     3.375 g 100 mL/hr over 30 Minutes Intravenous  Once 11/18/14 0839 11/18/14 0934   11/18/14 0845  vancomycin (VANCOCIN) IVPB 1000 mg/200 mL premix  Status:  Discontinued     1,000 mg 200 mL/hr over 60 Minutes Intravenous  Once 11/18/14 0839 11/18/14 0933      Total days of antibiotics: 4 vanco/zosyn          Social History:  reports that he has never smoked. He does not have any smokeless tobacco history on file. He reports that he does not drink alcohol or use illicit drugs.  Family History  Problem Relation Age of Onset  . Cancer Maternal Aunt   . Cancer Maternal Uncle   . Heart disease Paternal Grandfather     General ROS: no normal, prev blurry. no neuropathy. normal apetite, no dysphagia. no sob, no cough. no BM for 3 days prior to adm. first BM today, 3 loose, lg. no dysuria. all other ROS negative.   Blood pressure 134/98, pulse 93, temperature 98.6 F (37 C), temperature source  Oral, resp. rate 23, height $RemoveBe'5\' 6"'[id number]$  (1.676 m), weight 74.4 kg (164 lb 0.4 oz), SpO2 100 %. General appearance: alert, cooperative and no distress Eyes: negative findings: conjunctivae and sclerae normal and pupils equal, round, reactive to light and accomodation Throat: normal findings: oropharynx pink & moist without lesions or evidence of thrush Neck: no adenopathy, supple, symmetrical, trachea midline and thyroid not enlarged, symmetric, no tenderness/mass/nodules Lungs: clear to auscultation bilaterally Heart: regular rate and rhythm Abdomen: normal findings: bowel sounds normal and soft, non-tender Extremities: edema none Skin: Skin color, texture, turgor normal. No rashes or lesions or no diabetic foot lesions Neurologic: Sensory: normal light touch BLE.  Motor: 5/5 BLE  Results for orders placed or performed during the hospital encounter of 11/18/14 (from the past 48 hour(s))  Glucose, capillary     Status: Abnormal   Collection Time: 11/19/14  4:39 PM  Result Value Ref Range   Glucose-Capillary 216 (H) 65 - 99 mg/dL   Comment 1 Notify RN    Comment 2 Document in Chart   Glucose, capillary     Status: Abnormal   Collection Time: 11/19/14  5:42 PM  Result Value Ref Range   Glucose-Capillary 202 (H) 65 - 99 mg/dL  Glucose, capillary     Status: Abnormal   Collection Time: 11/19/14  6:44 PM  Result Value Ref Range   Glucose-Capillary 178 (H) 65 - 99 mg/dL  Basic metabolic panel     Status: Abnormal   Collection Time: 11/19/14  7:06 PM  Result Value Ref Range   Sodium 131 (L) 135 - 145 mmol/L   Potassium 2.9 (L) 3.5 - 5.1 mmol/L   Chloride 110 101 - 111 mmol/L   CO2 13 (L) 22 - 32 mmol/L   Glucose, Bld 179 (H) 65 - 99 mg/dL   BUN 19 6 - 20 mg/dL   Creatinine, Ser 1.24 0.61 - 1.24 mg/dL   Calcium 8.5 (L) 8.9 - 10.3 mg/dL   GFR calc non Af Amer >60 >60 mL/min   GFR calc Af Amer >60 >60 mL/min    Comment: (NOTE) The eGFR has been calculated using the CKD EPI equation. This  calculation has not been validated in all clinical situations. eGFR's persistently <60 mL/min signify possible Chronic Kidney Disease.    Anion gap 8 5 - 15  Glucose, capillary     Status: Abnormal   Collection Time: 11/19/14  7:45 PM  Result Value Ref Range   Glucose-Capillary 182 (H) 65 - 99 mg/dL   Comment 1 Notify RN    Comment 2 Document in Chart   Glucose, capillary     Status: Abnormal   Collection Time: 11/19/14  8:48 PM  Result Value Ref Range   Glucose-Capillary 180 (H) 65 - 99 mg/dL   Comment 1 Notify RN    Comment 2 Document in Chart   Glucose, capillary     Status: Abnormal   Collection Time: 11/19/14  9:56 PM  Result Value Ref Range   Glucose-Capillary 176 (H) 65 - 99 mg/dL   Comment 1 Notify RN    Comment 2 Document in Chart   Glucose, capillary     Status: Abnormal   Collection Time: 11/19/14 11:02 PM  Result Value Ref Range   Glucose-Capillary 162 (H) 65 - 99 mg/dL  Glucose, capillary     Status: Abnormal   Collection Time: 11/19/14 11:59 PM  Result Value Ref Range   Glucose-Capillary 128 (H) 65 - 99 mg/dL   Comment 1 Document in Chart   Glucose, capillary     Status: Abnormal   Collection Time: 11/20/14 12:58 AM  Result Value Ref Range   Glucose-Capillary 171 (H) 65 - 99 mg/dL   Comment 1 Notify RN   Glucose, capillary     Status: Abnormal   Collection Time: 11/20/14  1:58 AM  Result Value Ref Range   Glucose-Capillary 139 (H) 65 - 99 mg/dL  Glucose, capillary     Status: Abnormal   Collection Time: 11/20/14  2:58 AM  Result Value Ref Range   Glucose-Capillary 196 (H) 65 - 99 mg/dL   Comment 1 Notify RN   CBC  Status: Abnormal   Collection Time: 11/20/14  3:40 AM  Result Value Ref Range   WBC 6.1 4.0 - 10.5 K/uL   RBC 3.61 (L) 4.22 - 5.81 MIL/uL   Hemoglobin 12.7 (L) 13.0 - 17.0 g/dL   HCT 34.5 (L) 39.0 - 52.0 %   MCV 94.0 78.0 - 100.0 fL   MCH 34.6 (H) 26.0 - 34.0 pg   MCHC 36.8 (H) 30.0 - 36.0 g/dL    Comment: CORRECTED FOR LIPEMIA   RDW  13.1 11.5 - 15.5 %   Platelets 104 (L) 150 - 400 K/uL    Comment: REPEATED TO VERIFY SPECIMEN CHECKED FOR CLOTS CONSISTENT WITH PREVIOUS RESULT   Basic metabolic panel     Status: Abnormal   Collection Time: 11/20/14  3:40 AM  Result Value Ref Range   Sodium 132 (L) 135 - 145 mmol/L   Potassium 2.9 (L) 3.5 - 5.1 mmol/L   Chloride 111 101 - 111 mmol/L   CO2 14 (L) 22 - 32 mmol/L   Glucose, Bld 176 (H) 65 - 99 mg/dL   BUN 17 6 - 20 mg/dL   Creatinine, Ser 1.19 0.61 - 1.24 mg/dL   Calcium 8.4 (L) 8.9 - 10.3 mg/dL   GFR calc non Af Amer >60 >60 mL/min   GFR calc Af Amer >60 >60 mL/min    Comment: (NOTE) The eGFR has been calculated using the CKD EPI equation. This calculation has not been validated in all clinical situations. eGFR's persistently <60 mL/min signify possible Chronic Kidney Disease.    Anion gap 7 5 - 15  Magnesium     Status: None   Collection Time: 11/20/14  3:40 AM  Result Value Ref Range   Magnesium 2.0 1.7 - 2.4 mg/dL  Phosphorus     Status: Abnormal   Collection Time: 11/20/14  3:40 AM  Result Value Ref Range   Phosphorus <0.1 (LL) 2.5 - 4.6 mg/dL    Comment: REPEATED TO VERIFY CRITICAL RESULT CALLED TO, READ BACK BY AND VERIFIED WITHBarnetta Chapel [facility] RN [phone] 11/20/14 A NAVARRO   Glucose, capillary     Status: Abnormal   Collection Time: 11/20/14  4:10 AM  Result Value Ref Range   Glucose-Capillary 166 (H) 65 - 99 mg/dL  Glucose, capillary     Status: Abnormal   Collection Time: 11/20/14  6:04 AM  Result Value Ref Range   Glucose-Capillary 144 (H) 65 - 99 mg/dL   Comment 1 Notify RN   Glucose, capillary     Status: Abnormal   Collection Time: 11/20/14  8:28 AM  Result Value Ref Range   Glucose-Capillary 153 (H) 65 - 99 mg/dL   Comment 1 Notify RN    Comment 2 Document in Chart   Glucose, capillary     Status: Abnormal   Collection Time: 11/20/14  9:33 AM  Result Value Ref Range   Glucose-Capillary 162 (H) 65 - 99 mg/dL  Glucose, capillary     Status:  Abnormal   Collection Time: 11/20/14 10:37 AM  Result Value Ref Range   Glucose-Capillary 146 (H) 65 - 99 mg/dL  Glucose, capillary     Status: Abnormal   Collection Time: 11/20/14 11:37 AM  Result Value Ref Range   Glucose-Capillary 149 (H) 65 - 99 mg/dL  Culture, blood (routine x 2)     Status: None (Preliminary result)   Collection Time: 11/20/14 12:20 PM  Result Value Ref Range   Specimen Description BLOOD RIGHT ARM  Special Requests BOTTLES DRAWN AEROBIC AND ANAEROBIC 5CC    Culture      NO GROWTH < 24 HOURS Performed at [facility]    Report Status PENDING   Glucose, capillary     Status: Abnormal   Collection Time: 11/20/14 12:37 PM  Result Value Ref Range   Glucose-Capillary 162 (H) 65 - 99 mg/dL  Glucose, capillary     Status: Abnormal   Collection Time: 11/20/14  1:43 PM  Result Value Ref Range   Glucose-Capillary 177 (H) 65 - 99 mg/dL   Comment 1 Notify RN    Comment 2 Document in Chart   Glucose, capillary     Status: Abnormal   Collection Time: 11/20/14  2:48 PM  Result Value Ref Range   Glucose-Capillary 258 (H) 65 - 99 mg/dL  Urinalysis, Routine w reflex microscopic (not at [facility])     Status: Abnormal   Collection Time: 11/20/14  4:15 PM  Result Value Ref Range   Color, Urine YELLOW YELLOW   APPearance CLEAR CLEAR   Specific Gravity, Urine 1.016 1.005 - 1.030   pH 5.5 5.0 - 8.0   Glucose, UA 250 (A) NEGATIVE mg/dL   Hgb urine dipstick TRACE (A) NEGATIVE   Bilirubin Urine NEGATIVE NEGATIVE   Ketones, ur NEGATIVE NEGATIVE mg/dL   Protein, ur 30 (A) NEGATIVE mg/dL   Urobilinogen, UA 0.2 0.0 - 1.0 mg/dL   Nitrite NEGATIVE NEGATIVE   Leukocytes, UA NEGATIVE NEGATIVE  Urine microscopic-add on     Status: None   Collection Time: 11/20/14  4:15 PM  Result Value Ref Range   Bacteria, UA RARE RARE  Glucose, capillary     Status: Abnormal   Collection Time: 11/20/14  4:17 PM  Result Value Ref Range   Glucose-Capillary 169 (H) 65 - 99 mg/dL  Glucose,  capillary     Status: Abnormal   Collection Time: 11/20/14  5:34 PM  Result Value Ref Range   Glucose-Capillary 137 (H) 65 - 99 mg/dL   Comment 1 Document in Chart    Comment 2 Repeat Test   Culture, blood (routine x 2)     Status: None (Preliminary result)   Collection Time: 11/20/14  5:50 PM  Result Value Ref Range   Specimen Description BLOOD LEFT ARM    Special Requests IN PEDIATRIC BOTTLE 4CC    Culture      NO GROWTH < 24 HOURS Performed at [facility]    Report Status PENDING   Glucose, capillary     Status: Abnormal   Collection Time: 11/20/14  6:37 PM  Result Value Ref Range   Glucose-Capillary 142 (H) 65 - 99 mg/dL  Glucose, capillary     Status: Abnormal   Collection Time: 11/20/14  7:47 PM  Result Value Ref Range   Glucose-Capillary 163 (H) 65 - 99 mg/dL  Magnesium     Status: None   Collection Time: 11/20/14  9:00 PM  Result Value Ref Range   Magnesium 1.7 1.7 - 2.4 mg/dL  Phosphorus     Status: Abnormal   Collection Time: 11/20/14  9:00 PM  Result Value Ref Range   Phosphorus 1.6 (L) 2.5 - 4.6 mg/dL  Glucose, capillary     Status: Abnormal   Collection Time: 11/20/14  9:02 PM  Result Value Ref Range   Glucose-Capillary 178 (H) 65 - 99 mg/dL  Glucose, capillary     Status: Abnormal   Collection Time: 11/20/14 10:02 PM  Result Value  Ref Range   Glucose-Capillary 137 (H) 65 - 99 mg/dL  Vancomycin, trough     Status: None   Collection Time: 11/20/14 10:08 PM  Result Value Ref Range   Vancomycin Tr 14 10.0 - 20.0 ug/mL  Glucose, capillary     Status: Abnormal   Collection Time: 11/20/14 11:03 PM  Result Value Ref Range   Glucose-Capillary 171 (H) 65 - 99 mg/dL  Glucose, capillary     Status: Abnormal   Collection Time: 11/21/14 12:05 AM  Result Value Ref Range   Glucose-Capillary 166 (H) 65 - 99 mg/dL  Glucose, capillary     Status: Abnormal   Collection Time: 11/21/14  1:02 AM  Result Value Ref Range   Glucose-Capillary 181 (H) 65 - 99 mg/dL    Glucose, capillary     Status: Abnormal   Collection Time: 11/21/14  2:01 AM  Result Value Ref Range   Glucose-Capillary 182 (H) 65 - 99 mg/dL  Glucose, capillary     Status: Abnormal   Collection Time: 11/21/14  3:03 AM  Result Value Ref Range   Glucose-Capillary 145 (H) 65 - 99 mg/dL  CBC     Status: Abnormal   Collection Time: 11/21/14  3:36 AM  Result Value Ref Range   WBC 7.3 4.0 - 10.5 K/uL   RBC 3.54 (L) 4.22 - 5.81 MIL/uL   Hemoglobin 12.4 (L) 13.0 - 17.0 g/dL   HCT 33.6 (L) 39.0 - 52.0 %   MCV 94.9 78.0 - 100.0 fL   MCH 35.0 (H) 26.0 - 34.0 pg   MCHC 36.9 (H) 30.0 - 36.0 g/dL    Comment: CORRECTED FOR LIPEMIA   RDW 13.2 11.5 - 15.5 %   Platelets 116 (L) 150 - 400 K/uL    Comment: SPECIMEN CHECKED FOR CLOTS REPEATED TO VERIFY PLATELET COUNT CONFIRMED BY SMEAR   Phosphorus     Status: Abnormal   Collection Time: 11/21/14  3:36 AM  Result Value Ref Range   Phosphorus 1.3 (L) 2.5 - 4.6 mg/dL  Magnesium     Status: Abnormal   Collection Time: 11/21/14  3:36 AM  Result Value Ref Range   Magnesium 1.6 (L) 1.7 - 2.4 mg/dL  Basic metabolic panel     Status: Abnormal   Collection Time: 11/21/14  3:36 AM  Result Value Ref Range   Sodium 137 135 - 145 mmol/L   Potassium 2.6 (LL) 3.5 - 5.1 mmol/L    Comment: REPEATED TO VERIFY CRITICAL RESULT CALLED TO, READ BACK BY AND VERIFIED WITH: A KROLICZAK RN AT [id number] ON 07.26.16 BY G KONTOS    Chloride 110 101 - 111 mmol/L   CO2 17 (L) 22 - 32 mmol/L   Glucose, Bld 137 (H) 65 - 99 mg/dL   BUN 11 6 - 20 mg/dL   Creatinine, Ser 1.04 0.61 - 1.24 mg/dL   Calcium 8.4 (L) 8.9 - 10.3 mg/dL   GFR calc non Af Amer >60 >60 mL/min   GFR calc Af Amer >60 >60 mL/min    Comment: (NOTE) The eGFR has been calculated using the CKD EPI equation. This calculation has not been validated in all clinical situations. eGFR's persistently <60 mL/min signify possible Chronic Kidney Disease.    Anion gap 10 5 - 15  Glucose, capillary     Status:  Abnormal   Collection Time: 11/21/14  4:01 AM  Result Value Ref Range   Glucose-Capillary 145 (H) 65 - 99 mg/dL  Glucose, capillary  Status: Abnormal   Collection Time: 11/21/14  5:08 AM  Result Value Ref Range   Glucose-Capillary 133 (H) 65 - 99 mg/dL  Glucose, capillary     Status: Abnormal   Collection Time: 11/21/14  6:00 AM  Result Value Ref Range   Glucose-Capillary 131 (H) 65 - 99 mg/dL  Glucose, capillary     Status: Abnormal   Collection Time: 11/21/14  6:52 AM  Result Value Ref Range   Glucose-Capillary 153 (H) 65 - 99 mg/dL  Glucose, capillary     Status: Abnormal   Collection Time: 11/21/14  8:01 AM  Result Value Ref Range   Glucose-Capillary 142 (H) 65 - 99 mg/dL   Comment 1 Notify RN    Comment 2 Document in Chart    Comment 3 Glucose Stabilizer   Glucose, capillary     Status: Abnormal   Collection Time: 11/21/14  9:09 AM  Result Value Ref Range   Glucose-Capillary 167 (H) 65 - 99 mg/dL  Glucose, capillary     Status: Abnormal   Collection Time: 11/21/14 10:00 AM  Result Value Ref Range   Glucose-Capillary 215 (H) 65 - 99 mg/dL   Comment 1 Notify RN    Comment 2 Document in Chart    Comment 3 Glucose Stabilizer   Glucose, capillary     Status: Abnormal   Collection Time: 11/21/14 11:06 AM  Result Value Ref Range   Glucose-Capillary 183 (H) 65 - 99 mg/dL  Glucose, capillary     Status: Abnormal   Collection Time: 11/21/14 12:32 PM  Result Value Ref Range   Glucose-Capillary 121 (H) 65 - 99 mg/dL  Basic metabolic panel     Status: Abnormal   Collection Time: 11/21/14 12:40 PM  Result Value Ref Range   Sodium 133 (L) 135 - 145 mmol/L   Potassium 2.9 (L) 3.5 - 5.1 mmol/L   Chloride 108 101 - 111 mmol/L   CO2 18 (L) 22 - 32 mmol/L   Glucose, Bld 147 (H) 65 - 99 mg/dL   BUN 11 6 - 20 mg/dL   Creatinine, Ser 1.23 0.61 - 1.24 mg/dL   Calcium 8.5 (L) 8.9 - 10.3 mg/dL   GFR calc non Af Amer >60 >60 mL/min   GFR calc Af Amer >60 >60 mL/min    Comment:  (NOTE) The eGFR has been calculated using the CKD EPI equation. This calculation has not been validated in all clinical situations. eGFR's persistently <60 mL/min signify possible Chronic Kidney Disease.    Anion gap 7 5 - 15  Glucose, capillary     Status: Abnormal   Collection Time: 11/21/14  1:50 PM  Result Value Ref Range   Glucose-Capillary 143 (H) 65 - 99 mg/dL  Glucose, capillary     Status: Abnormal   Collection Time: 11/21/14  2:56 PM  Result Value Ref Range   Glucose-Capillary 167 (H) 65 - 99 mg/dL      Component Value Date/Time   SDES BLOOD LEFT ARM 11/20/2014 1750   SPECREQUEST IN PEDIATRIC BOTTLE 4CC 11/20/2014 1750   CULT  11/20/2014 1750    NO GROWTH < 24 HOURS Performed at [place] PENDING 11/20/2014 1750   Ct Chest W Contrast  11/20/2014   CLINICAL DATA:  Continues to spike fevers. Source remains industry. Fever of unknown origin. Blood cultures negative.  EXAM: CT CHEST, ABDOMEN, AND PELVIS WITH CONTRAST  TECHNIQUE: Multidetector CT imaging of the chest, abdomen and pelvis was performed  following the standard protocol during bolus administration of intravenous contrast.  CONTRAST:  141mL OMNIPAQUE IOHEXOL 300 MG/ML  SOLN  COMPARISON:  Chest x-ray 11/19/2014  FINDINGS: CT CHEST FINDINGS  Heart: Heart size is normal. No imaged pericardial effusion or significant coronary artery calcifications.  Vascular structures: Normal  Mediastinum/thyroid: No mediastinal, hilar, or axillary adenopathy. Thyroid has a normal appearance.  Lungs/Airways: There are bilateral pleural effusions. Bibasilar atelectasis. No suspicious pulmonary nodules.  Chest wall/osseous:  Unremarkable.  CT ABDOMEN AND PELVIS FINDINGS  Upper abdomen: There is geographic distribution of abnormal enhancement of the liver. Focally there is higher attenuation in the region the gallbladder fossa and I suspect the patient underlying hepatic steatosis and for fusion abnormality superimposed.  No focal liver lesions are identified. There is significant fluid surrounding the pancreas without evidence for focal pancreatic lesion. There is normal enhancement of the pancreas. No focal abnormality identified within the spleen, adrenal glands, or kidneys. The gallbladder is present.  Gastrointestinal tract: The duodenum is of normal. Duodenal wall is thickened and enhances. There is fluid surrounding the duodenum and distal stomach wall. Significant fluid within the retroperitoneum, seen in the left anterior para renal space. The stomach otherwise has a normal appearance. Colonic loops are fluid filled and somewhat distended and otherwise normal.  Pelvis: There is free pelvic fluid. Urinary bladder is distended. Prostate and seminal vesicles normal in appearance.  Retroperitoneum: No retroperitoneal or mesenteric adenopathy. No evidence for aortic aneurysm.  Abdominal wall: Unremarkable.  Osseous structures: Unremarkable.  IMPRESSION: 1. Bilateral pleural effusions and bibasilar atelectasis. 2. Significant edema of the duodenum wall, associated with retroperitoneal fluid. There is no evidence for perforation or abscess. Findings are consistent duodenitis. Consider inflammatory bowel disease. 3. Nonspecific perfusion abnormality of the liver. Also suspect hepatic steatosis. 4. Small amount of free pelvic fluid. 5. Distended urinary bladder.   Electronically Signed   By: Nolon Nations M.D.   On: 11/20/2014 16:51   Ct Abdomen Pelvis W Contrast  11/20/2014   CLINICAL DATA:  Continues to spike fevers. Source remains industry. Fever of unknown origin. Blood cultures negative.  EXAM: CT CHEST, ABDOMEN, AND PELVIS WITH CONTRAST  TECHNIQUE: Multidetector CT imaging of the chest, abdomen and pelvis was performed following the standard protocol during bolus administration of intravenous contrast.  CONTRAST:  11mL OMNIPAQUE IOHEXOL 300 MG/ML  SOLN  COMPARISON:  Chest x-ray 11/19/2014  FINDINGS: CT CHEST FINDINGS   Heart: Heart size is normal. No imaged pericardial effusion or significant coronary artery calcifications.  Vascular structures: Normal  Mediastinum/thyroid: No mediastinal, hilar, or axillary adenopathy. Thyroid has a normal appearance.  Lungs/Airways: There are bilateral pleural effusions. Bibasilar atelectasis. No suspicious pulmonary nodules.  Chest wall/osseous:  Unremarkable.  CT ABDOMEN AND PELVIS FINDINGS  Upper abdomen: There is geographic distribution of abnormal enhancement of the liver. Focally there is higher attenuation in the region the gallbladder fossa and I suspect the patient underlying hepatic steatosis and for fusion abnormality superimposed. No focal liver lesions are identified. There is significant fluid surrounding the pancreas without evidence for focal pancreatic lesion. There is normal enhancement of the pancreas. No focal abnormality identified within the spleen, adrenal glands, or kidneys. The gallbladder is present.  Gastrointestinal tract: The duodenum is of normal. Duodenal wall is thickened and enhances. There is fluid surrounding the duodenum and distal stomach wall. Significant fluid within the retroperitoneum, seen in the left anterior para renal space. The stomach otherwise has a normal appearance. Colonic loops are fluid filled and somewhat  distended and otherwise normal.  Pelvis: There is free pelvic fluid. Urinary bladder is distended. Prostate and seminal vesicles normal in appearance.  Retroperitoneum: No retroperitoneal or mesenteric adenopathy. No evidence for aortic aneurysm.  Abdominal wall: Unremarkable.  Osseous structures: Unremarkable.  IMPRESSION: 1. Bilateral pleural effusions and bibasilar atelectasis. 2. Significant edema of the duodenum wall, associated with retroperitoneal fluid. There is no evidence for perforation or abscess. Findings are consistent duodenitis. Consider inflammatory bowel disease. 3. Nonspecific perfusion abnormality of the liver. Also  suspect hepatic steatosis. 4. Small amount of free pelvic fluid. 5. Distended urinary bladder.   Electronically Signed   By: Nolon Nations M.D.   On: 11/20/2014 16:51   Recent Results (from the past 240 hour(s))  MRSA PCR Screening     Status: Abnormal   Collection Time: 11/18/14  9:00 AM  Result Value Ref Range Status   MRSA by PCR POSITIVE (A) NEGATIVE Final    Comment:        The GeneXpert MRSA Assay (FDA approved for NASAL specimens only), is one component of a comprehensive MRSA colonization surveillance program. It is not intended to diagnose MRSA infection nor to guide or monitor treatment for MRSA infections. CRITICAL RESULT CALLED TO, READ BACK BY AND VERIFIED WITH: A.ASHLEY AT 1207 ON 11/18/14 BY S.VANHOORNE   Culture, blood (x 2)     Status: None (Preliminary result)   Collection Time: 11/18/14 10:10 AM  Result Value Ref Range Status   Specimen Description BLOOD LEFT ARM  Final   Special Requests BOTTLES DRAWN AEROBIC ONLY [place]  Final   Culture   Final    NO GROWTH 3 DAYS Performed at [facility]    Report Status PENDING  Incomplete  Culture, blood (x 2)     Status: None (Preliminary result)   Collection Time: 11/18/14 10:32 AM  Result Value Ref Range Status   Specimen Description BLOOD RIGHT HAND  Final   Special Requests BOTTLES DRAWN AEROBIC AND ANAEROBIC 10CC  Final   Culture   Final    NO GROWTH 3 DAYS Performed at [facility]    Report Status PENDING  Incomplete  Culture, blood (routine x 2)     Status: None (Preliminary result)   Collection Time: 11/20/14 12:20 PM  Result Value Ref Range Status   Specimen Description BLOOD RIGHT ARM  Final   Special Requests BOTTLES DRAWN AEROBIC AND ANAEROBIC 5CC  Final   Culture   Final    NO GROWTH < 24 HOURS Performed at [facility]    Report Status PENDING  Incomplete  Culture, blood (routine x 2)     Status: None (Preliminary result)   Collection Time: 11/20/14  5:50 PM  Result Value  Ref Range Status   Specimen Description BLOOD LEFT ARM  Final   Special Requests IN PEDIATRIC BOTTLE 4CC  Final   Culture   Final    NO GROWTH < 24 HOURS Performed at [facility]    Report Status PENDING  Incomplete      11/21/2014, 3:39 PM     LOS: 3 days

## 2014-11-21 NOTE — Progress Notes (Signed)
ANTIBIOTIC CONSULT NOTE - FOLLOW UP  Pharmacy Consult for Vancomycin Indication: rule out sepsis  No Known Allergies  Patient Measurements: Height:  (167.6 cm) Weight: 164 lb 0.4 oz (74.4 kg) IBW/kg (Calculated) : 63.8 Adjusted Body Weight:   Vital Signs: Temp: 97.9 F (36.6 C) (07/26 0400) Temp Source: Oral (07/26 0400) BP: 131/103 mmHg (07/26 0400) Pulse Rate: 93 (07/26 0400) Intake/Output from previous day: 07/25 0701 - 07/26 0700 In: 3676.7 [I.V.:2794.2; IV Piggyback:882.5] Out: 2850 [Urine:2850] Intake/Output from this shift: Total I/O In: 1731.1 [I.V.:1418.6; IV Piggyback:312.5] Out: 2000 [Urine:2000]  Labs:  Recent Labs  11/19/14 0722  11/19/14 1906 11/20/14 0340 11/21/14 0336  WBC 5.4  --   --  6.1 7.3  HGB 14.2  --   --  12.7* 12.4*  PLT 106*  --   --  104* 116*  CREATININE 1.29*  < > 1.24 1.19 1.04  < > = values in this interval not displayed. Estimated Creatinine Clearance: 98.8 mL/min (by C-G formula based on Cr of 1.04).  Recent Labs  11/20/14 2208  [facility] 14     Microbiology: Recent Results (from the past 720 hour(s))  MRSA PCR Screening     Status: Abnormal   Collection Time: 11/18/14  9:00 AM  Result Value Ref Range Status   MRSA by PCR POSITIVE (A) NEGATIVE Final    Comment:        The GeneXpert MRSA Assay (FDA approved for NASAL specimens only), is one component of a comprehensive MRSA colonization surveillance program. It is not intended to diagnose MRSA infection nor to guide or monitor treatment for MRSA infections. CRITICAL RESULT CALLED TO, READ BACK BY AND VERIFIED WITH: A.ASHLEY AT 1207 ON 11/18/14 BY S.VANHOORNE   Culture, blood (x 2)     Status: None (Preliminary result)   Collection Time: 11/18/14 10:10 AM  Result Value Ref Range Status   Specimen Description BLOOD LEFT ARM  Final   Special Requests BOTTLES DRAWN AEROBIC ONLY 7CC  Final   Culture   Final    NO GROWTH 2 DAYS Performed at [facility]    Report Status PENDING  Incomplete  Culture, blood (x 2)     Status: None (Preliminary result)   Collection Time: 11/18/14 10:32 AM  Result Value Ref Range Status   Specimen Description BLOOD RIGHT HAND  Final   Special Requests BOTTLES DRAWN AEROBIC AND ANAEROBIC 10CC  Final   Culture   Final    NO GROWTH 2 DAYS Performed at [facility]    Report Status PENDING  Incomplete    Anti-infectives    Start     Dose/Rate Route Frequency Ordered Stop   11/21/14 0800  vancomycin (VANCOCIN) IVPB 1000 mg/200 mL premix     1,000 mg 200 mL/hr over 60 Minutes Intravenous Every 12 hours 11/21/14 0526     11/18/14 2200  vancomycin (VANCOCIN) IVPB 750 mg/150 ml premix  Status:  Discontinued     750 mg 150 mL/hr over 60 Minutes Intravenous Every 12 hours 11/18/14 0923 11/18/14 1543   11/18/14 2200  vancomycin (VANCOCIN) IVPB 750 mg/150 ml premix  Status:  Discontinued     750 mg 150 mL/hr over 60 Minutes Intravenous Every 12 hours 11/18/14 1557 11/21/14 0525   11/18/14 2000  piperacillin-tazobactam (ZOSYN) IVPB 3.375 g     3.375 g 12.5 mL/hr over 240 Minutes Intravenous Every 8 hours 11/18/14 1557     11/18/14 1600  piperacillin-tazobactam (ZOSYN) IVPB 3.375  g  Status:  Discontinued     3.375 g 12.5 mL/hr over 240 Minutes Intravenous Every 8 hours 11/18/14 0923 11/18/14 0934   11/18/14 1000  vancomycin (VANCOCIN) IVPB 1000 mg/200 mL premix     1,000 mg 200 mL/hr over 60 Minutes Intravenous  Once 11/18/14 0934 11/18/14 1155   11/18/14 1000  piperacillin-tazobactam (ZOSYN) IVPB 3.375 g  Status:  Discontinued     3.375 g 12.5 mL/hr over 240 Minutes Intravenous Every 8 hours 11/18/14 0934 11/18/14 1543   11/18/14 0845  piperacillin-tazobactam (ZOSYN) IVPB 3.375 g  Status:  Discontinued     3.375 g 100 mL/hr over 30 Minutes Intravenous  Once 11/18/14 0839 11/18/14 0934   11/18/14 0845  vancomycin (VANCOCIN) IVPB 1000 mg/200 mL premix  Status:  Discontinued     1,000 mg 200  mL/hr over 60 Minutes Intravenous  Once 11/18/14 0839 11/18/14 0933      Assessment: Patient with vancomycin level just below goal.  Prior doses charted correctly.  Goal of Therapy:  Vancomycin trough level 15-20 mcg/ml  Plan:  Measure antibiotic drug levels at steady state Follow up culture results Change vancomycin to 1gm iv q12hr  Christopher Robles, Christopher Robles 11/21/2014,5:27 AM

## 2014-11-21 NOTE — Progress Notes (Signed)
Transition to subQ insulin. Stop insulin drip. Give Lantus 5 units once now. Order placed to transfer to telemetry floor. [name] [facility]  [phone]

## 2014-11-21 NOTE — Progress Notes (Signed)
Patient has had more than 3 loose stools this shift.  Per infectious disease MD, patient does not need to be on enteric precautions or tested for C.Diff at this time.

## 2014-11-21 NOTE — Progress Notes (Addendum)
Inpatient Diabetes Program Recommendations  AACE/ADA: New Consensus Statement on Inpatient Glycemic Control (2013)  Target Ranges:  Prepandial:   less than 140 mg/dL      Peak postprandial:   less than 180 mg/dL (1-2 hours)      Critically ill patients:  140 - 180 mg/dL    Home DM Meds: 16/10 insulin- 17 units bidwc  Current DM Orders: IV Insulin drip per DKA Protocol    -Note patient's CO2 somewhat improved on BMET this AM (17). Patient still having issues with low potassium and low phosphate levels this AM.   -Patient remains on IV insulin drip since 07/23.  -Current A1c shows poor glucose control at home.  Will speak with patient about this when appropriate.     MD- Once patient is finally ready to transition off IV insulin drip, recommend giving patient Levemir 1-2 hours before IV Insulin drip stopped. Patient gets about 24 units long-acting insulin in his two doses of 70/30 insulin per day. Would give Levemir 24 units x one dose and start Novolog Sensitive (0-9 units) SSI.  Once patient is eating and able to maintain PO Intake, can switch patient back to his home doses of 70/30 insulin. Will not be able to afford Levemir and Novolog after d/c since he has No health insurance.  Also, given history of non-adherence, 70/30 insulin bidwc may be the easiest insulin option available for patient at present.      1500 Addendum: Spoke with patient about his DM care regimen at home, reason for admission, A1c, treatments we are giving him, etc.  Explained what DKA is and how it affects the body.  Explained why we are giving him IVF and IV insulin drip.  Also explained to patient why he needs insulin and the extreme importance of good glucose control to prevent long-term complications.  Gave patient a basic explanation of DM and reminded him that he likely makes little to no insulin and that he needs insulin to live.    Patient told me he stopped taking insulin about 2 months ago  b/c he ran out.  [facility] to the [facility] once (12/29/13) after he was admitted for DKA back in August of last year.  Has not been back to the [facility] clinic since that visit last September.  Patient told me he couldn't afford to buy insulin.  Patient went on to tell me that he had the orange card for Rx assistance but that he lost all Rx help when he got a job at [employer]. Patient also stated he lost his food stamp benefits as well.  Explained to patient that he can purchase Reli-On brand 70/30 insulin from [employer] for $25 per vial.  Also gave patient information on inexpensive CBG meter and strips from [employer] as well.  Meter is $16 and a box of 50 strips is $9.  Encouraged patient to get re-established at the [facility] clinic after d/c.  Also encouraged patient to check his CBGs at least tid before meals at home and to keep a CBG record.  Explained to patient that goal CBGs at home are 80-130 in the AM and less than 180 the rest of the day.     Will follow Ambrose Finland RN, MSN, CDE Diabetes Coordinator Inpatient Glycemic Control Team Team Pager: [phone] (8a-5p)

## 2014-11-22 DIAGNOSIS — R079 Chest pain, unspecified: Secondary | ICD-10-CM

## 2014-11-22 DIAGNOSIS — K2980 Duodenitis without bleeding: Secondary | ICD-10-CM

## 2014-11-22 DIAGNOSIS — F10239 Alcohol dependence with withdrawal, unspecified: Secondary | ICD-10-CM

## 2014-11-22 DIAGNOSIS — Z794 Long term (current) use of insulin: Secondary | ICD-10-CM

## 2014-11-22 DIAGNOSIS — R1084 Generalized abdominal pain: Secondary | ICD-10-CM

## 2014-11-22 LAB — CLOSTRIDIUM DIFFICILE BY PCR: CDIFFPCR: NEGATIVE

## 2014-11-22 LAB — GLUCOSE, CAPILLARY
GLUCOSE-CAPILLARY: 270 mg/dL — AB (ref 65–99)
GLUCOSE-CAPILLARY: 237 mg/dL — AB (ref 65–99)
Glucose-Capillary: 149 mg/dL — ABNORMAL HIGH (ref 65–99)
Glucose-Capillary: 268 mg/dL — ABNORMAL HIGH (ref 65–99)
Glucose-Capillary: 400 mg/dL — ABNORMAL HIGH (ref 65–99)
Glucose-Capillary: 293 mg/dL — ABNORMAL HIGH (ref 65–99)
Glucose-Capillary: 215 mg/dL — ABNORMAL HIGH (ref 65–99)

## 2014-11-22 LAB — BASIC METABOLIC PANEL
ANION GAP: 18 — AB (ref 5–15)
Anion gap: 10 (ref 5–15)
BUN: 12 mg/dL (ref 6–20)
BUN: 14 mg/dL (ref 6–20)
CHLORIDE: 100 mmol/L — AB (ref 101–111)
CO2: 11 mmol/L — AB (ref 22–32)
CO2: 20 mmol/L — AB (ref 22–32)
CREATININE: 1.16 mg/dL (ref 0.61–1.24)
Calcium: 9.4 mg/dL (ref 8.9–10.3)
Calcium: 9.1 mg/dL (ref 8.9–10.3)
Chloride: 103 mmol/L (ref 101–111)
Creatinine, Ser: 1.39 mg/dL — ABNORMAL HIGH (ref 0.61–1.24)
GFR calc Af Amer: >60 mL/min (ref >60)
GFR calc non Af Amer: >60 mL/min (ref >60)
GLUCOSE: 419 mg/dL — AB (ref 65–99)
GLUCOSE: 232 mg/dL — AB (ref 65–99)
POTASSIUM: 3.8 mmol/L (ref 3.5–5.1)
Potassium: 5.2 mmol/L — ABNORMAL HIGH (ref 3.5–5.1)
Sodium: 132 mmol/L — ABNORMAL LOW (ref 135–145)
Sodium: 130 mmol/L — ABNORMAL LOW (ref 135–145)

## 2014-11-22 LAB — CBC
HCT: 38.3 % — ABNORMAL LOW (ref 39.0–52.0)
Hemoglobin: 13.4 g/dL (ref 13.0–17.0)
MCH: 34.3 pg — ABNORMAL HIGH (ref 26.0–34.0)
MCHC: 35 g/dL (ref 30.0–36.0)
MCV: 98 fL (ref 78.0–100.0)
Platelets: 136 10*3/uL — ABNORMAL LOW (ref 150–400)
RBC: 3.91 MIL/uL — AB (ref 4.22–5.81)
RDW: 13.9 % (ref 11.5–15.5)
WBC: 8.3 10*3/uL (ref 4.0–10.5)

## 2014-11-22 LAB — HIV ANTIBODY (ROUTINE TESTING W REFLEX): HIV Screen 4th Generation wRfx: NONREACTIVE

## 2014-11-22 MED ORDER — INSULIN ASPART 100 UNIT/ML ~~LOC~~ SOLN
10.0000 [IU] | Freq: Once | SUBCUTANEOUS | Status: AC
  Administered 2014-11-22: 10 [IU] via SUBCUTANEOUS

## 2014-11-22 MED ORDER — INSULIN ASPART 100 UNIT/ML ~~LOC~~ SOLN
0.0000 [IU] | Freq: Every day | SUBCUTANEOUS | Status: DC
  Administered 2014-11-22: 2 [IU] via SUBCUTANEOUS

## 2014-11-22 MED ORDER — GI COCKTAIL ~~LOC~~
30.0000 mL | Freq: Once | ORAL | Status: AC
  Administered 2014-11-22: 30 mL via ORAL
  Filled 2014-11-22: qty 30

## 2014-11-22 MED ORDER — INSULIN NPH (HUMAN) (ISOPHANE) 100 UNIT/ML ~~LOC~~ SUSP: 10.0000 [IU] | Freq: Once | SUBCUTANEOUS | Status: DC

## 2014-11-22 MED ORDER — INSULIN ASPART PROT & ASPART (70-30 MIX) 100 UNIT/ML ~~LOC~~ SUSP
17.0000 [IU] | Freq: Two times a day (BID) | SUBCUTANEOUS | Status: DC
  Administered 2014-11-22: 17 [IU] via SUBCUTANEOUS
  Filled 2014-11-22: qty 10

## 2014-11-22 MED ORDER — MAGIC MOUTHWASH W/LIDOCAINE
10.0000 mL | Freq: Four times a day (QID) | ORAL | Status: DC | PRN
  Filled 2014-11-22: qty 10

## 2014-11-22 MED ORDER — INSULIN ASPART 100 UNIT/ML ~~LOC~~ SOLN
0.0000 [IU] | Freq: Three times a day (TID) | SUBCUTANEOUS | Status: DC
  Administered 2014-11-22: 3 [IU] via SUBCUTANEOUS
  Administered 2014-11-22 – 2014-11-23 (×2): 5 [IU] via SUBCUTANEOUS
  Administered 2014-11-23: 7 [IU] via SUBCUTANEOUS
  Administered 2014-11-23 – 2014-11-24 (×2): 3 [IU] via SUBCUTANEOUS
  Administered 2014-11-24: 5 [IU] via SUBCUTANEOUS

## 2014-11-22 MED ORDER — FLUCONAZOLE 100 MG PO TABS
200.0000 mg | ORAL_TABLET | Freq: Every day | ORAL | Status: DC
  Administered 2014-11-22 – 2014-11-24 (×3): 200 mg via ORAL
  Filled 2014-11-22 (×3): qty 2

## 2014-11-22 MED ORDER — INSULIN NPH (HUMAN) (ISOPHANE) 100 UNIT/ML ~~LOC~~ SUSP
10.0000 [IU] | Freq: Once | SUBCUTANEOUS | Status: AC
  Administered 2014-11-22: 10 [IU] via SUBCUTANEOUS
  Filled 2014-11-22: qty 10

## 2014-11-22 NOTE — Progress Notes (Signed)
INFECTIOUS DISEASE PROGRESS NOTE  ID: Christopher Robles is a 24 y.o. male with  Active Problems:   DKA (diabetic ketoacidoses)   SIRS (systemic inflammatory response syndrome)   Hypokalemia   Hypomagnesemia   AKI (acute kidney injury)   Alcohol withdrawal   Diabetic ketoacidosis without coma associated with type 1 diabetes mellitus   Blood poisoning   FUO (fever of unknown origin)   Fever of unknown origin (FUO)  Subjective: Feels well, some continued chest pain from emesis  Abtx:  Anti-infectives    Start     Dose/Rate Route Frequency Ordered Stop   11/22/14 1300  fluconazole (DIFLUCAN) tablet 200 mg     200 mg Oral Daily 11/22/14 1219     11/21/14 0800  vancomycin (VANCOCIN) IVPB 1000 mg/200 mL premix  Status:  Discontinued     1,000 mg 200 mL/hr over 60 Minutes Intravenous Every 12 hours 11/21/14 0526 11/21/14 1625   11/18/14 2200  vancomycin (VANCOCIN) IVPB 750 mg/150 ml premix  Status:  Discontinued     750 mg 150 mL/hr over 60 Minutes Intravenous Every 12 hours 11/18/14 0923 11/18/14 1543   11/18/14 2200  vancomycin (VANCOCIN) IVPB 750 mg/150 ml premix  Status:  Discontinued     750 mg 150 mL/hr over 60 Minutes Intravenous Every 12 hours 11/18/14 1557 11/21/14 0525   11/18/14 2000  piperacillin-tazobactam (ZOSYN) IVPB 3.375 g     3.375 g 12.5 mL/hr over 240 Minutes Intravenous Every 8 hours 11/18/14 1557     11/18/14 1600  piperacillin-tazobactam (ZOSYN) IVPB 3.375 g  Status:  Discontinued     3.375 g 12.5 mL/hr over 240 Minutes Intravenous Every 8 hours 11/18/14 0923 11/18/14 0934   11/18/14 1000  vancomycin (VANCOCIN) IVPB 1000 mg/200 mL premix     1,000 mg 200 mL/hr over 60 Minutes Intravenous  Once 11/18/14 0934 11/18/14 1155   11/18/14 1000  piperacillin-tazobactam (ZOSYN) IVPB 3.375 g  Status:  Discontinued     3.375 g 12.5 mL/hr over 240 Minutes Intravenous Every 8 hours 11/18/14 0934 11/18/14 1543   11/18/14 0845  piperacillin-tazobactam (ZOSYN) IVPB 3.375 g   Status:  Discontinued     3.375 g 100 mL/hr over 30 Minutes Intravenous  Once 11/18/14 0839 11/18/14 0934   11/18/14 0845  vancomycin (VANCOCIN) IVPB 1000 mg/200 mL premix  Status:  Discontinued     1,000 mg 200 mL/hr over 60 Minutes Intravenous  Once 11/18/14 0839 11/18/14 0933      Medications:  Scheduled: . antiseptic oral rinse  7 mL Mouth Rinse q12n4p  . chlorhexidine  15 mL Mouth Rinse BID  . Chlorhexidine Gluconate Cloth  6 each Topical Q0600  . fluconazole  200 mg Oral Daily  . folic acid  1 mg Oral Daily  . insulin aspart  0-5 Units Subcutaneous QHS  . insulin aspart  0-9 Units Subcutaneous TID WC  . insulin aspart protamine- aspart  17 Units Subcutaneous BID WC  . multivitamin with minerals  1 tablet Oral Daily  . mupirocin ointment  1 application Nasal BID  . pantoprazole (PROTONIX) IV  40 mg Intravenous Q12H  . piperacillin-tazobactam (ZOSYN)  IV  3.375 g Intravenous Q8H  . thiamine  100 mg Oral Daily   Or  . thiamine  100 mg Intravenous Daily    Objective: Vital signs in last 24 hours: Temp:  [97.6 F (36.4 C)-98.6 F (37 C)] 98.6 F (37 C) (07/27 1355) Pulse Rate:  [97-124] 99 (07/27 1355) Resp:  [  14-22] 14 (07/27 1355) BP: (115-128)/(74-91) 120/74 mmHg (07/27 1355) SpO2:  [100 %] 100 % (07/27 1355)   General appearance: alert, cooperative and no distress Resp: clear to auscultation bilaterally Cardio: regular rate and rhythm GI: normal findings: bowel sounds normal and soft, non-tender Extremities: bruising  RUE from blood draws.   Lab Results  Recent Labs  11/21/14 0336 11/21/14 1240 11/22/14 0514 11/22/14 0830  WBC 7.3  --  8.3  --   HGB 12.4*  --  13.4  --   HCT 33.6*  --  38.3*  --   NA 137 133*  --  132*  K 2.6* 2.9*  --  5.2*  CL 110 108  --  103  CO2 17* 18*  --  11*  BUN 11 11  --  12  CREATININE 1.04 1.23  --  1.39*   Liver Panel No results for input(s): PROT, ALBUMIN, AST, ALT, ALKPHOS, BILITOT, BILIDIR, IBILI in the last 72  hours. Sedimentation Rate No results for input(s): ESRSEDRATE in the last 72 hours. C-Reactive Protein No results for input(s): CRP in the last 72 hours.  Microbiology: Recent Results (from the past 240 hour(s))  MRSA PCR Screening     Status: Abnormal   Collection Time: 11/18/14  9:00 AM  Result Value Ref Range Status   MRSA by PCR POSITIVE (A) NEGATIVE Final    Comment:        The GeneXpert MRSA Assay (FDA approved for NASAL specimens only), is one component of a comprehensive MRSA colonization surveillance program. It is not intended to diagnose MRSA infection nor to guide or monitor treatment for MRSA infections. CRITICAL RESULT CALLED TO, READ BACK BY AND VERIFIED WITH: A.ASHLEY AT 1207 ON 11/18/14 BY S.VANHOORNE   Culture, blood (x 2)     Status: None (Preliminary result)   Collection Time: 11/18/14 10:10 AM  Result Value Ref Range Status   Specimen Description BLOOD LEFT ARM  Final   Special Requests BOTTLES DRAWN AEROBIC ONLY 7CC  Final   Culture   Final    NO GROWTH 4 DAYS Performed at [facility]    Report Status PENDING  Incomplete  Culture, blood (x 2)     Status: None (Preliminary result)   Collection Time: 11/18/14 10:32 AM  Result Value Ref Range Status   Specimen Description BLOOD RIGHT HAND  Final   Special Requests BOTTLES DRAWN AEROBIC AND ANAEROBIC 10CC  Final   Culture   Final    NO GROWTH 4 DAYS Performed at [facility]    Report Status PENDING  Incomplete  Culture, blood (routine x 2)     Status: None (Preliminary result)   Collection Time: 11/20/14 12:20 PM  Result Value Ref Range Status   Specimen Description BLOOD RIGHT ARM  Final   Special Requests BOTTLES DRAWN AEROBIC AND ANAEROBIC 5CC  Final   Culture   Final    NO GROWTH 2 DAYS Performed at [facility]    Report Status PENDING  Incomplete  Culture, Urine     Status: None (Preliminary result)   Collection Time: 11/20/14  4:15 PM  Result Value Ref Range  Status   Specimen Description URINE, CLEAN CATCH  Final   Special Requests NONE  Final   Culture   Final    NO GROWTH < 24 HOURS Performed at [facility]    Report Status PENDING  Incomplete  Culture, blood (routine x 2)     Status:  None (Preliminary result)   Collection Time: 11/20/14  5:50 PM  Result Value Ref Range Status   Specimen Description BLOOD LEFT ARM  Final   Special Requests IN PEDIATRIC BOTTLE 4CC  Final   Culture   Final    NO GROWTH 2 DAYS Performed at [facility]    Report Status PENDING  Incomplete  Clostridium Difficile by PCR (not at [facility])     Status: None   Collection Time: 11/22/14  1:23 PM  Result Value Ref Range Status   C difficile by pcr NEGATIVE NEGATIVE Final    Studies/Results: No results found.   Assessment/Plan: Fever Resolved Not clear if this was DKA, etoh w/d, aspiration from emesis or duodenitis  DKA Still with elevated Glc Appreciate primary f/u  ARF Cr slightly worse today  ETOH withdrawal Continue CIWA protocol  Duodenitis (noted on CT) Would continue anbx for 7 more days  Chest pain Prn pain relief (tylenol)   Total days of antibiotics: 5 zosyn/diflucan (7-23)         Christopher Robles Infectious Diseases (pager) [phone] [email or web address] 11/22/2014, 4:48 PM  LOS: 4 days

## 2014-11-22 NOTE — Progress Notes (Signed)
Inpatient Diabetes Program Recommendations  AACE/ADA: New Consensus Statement on Inpatient Glycemic Control (2013)  Target Ranges:  Prepandial:   less than 140 mg/dL      Peak postprandial:   less than 180 mg/dL (1-2 hours)      Critically ill patients:  140 - 180 mg/dL   Results for Christopher Robles, Christopher Robles (MRN [id number]) as of 11/22/2014 14:35  Ref. Range 11/22/2014 07:54 11/22/2014 10:11 11/22/2014 12:21  Glucose-Capillary Latest Ref Range: 65-99 mg/dL 409 (H) 811 (H) 914 (H)    Home DM Meds: 70/30 insulin- 17 units bidwc  Current DM Orders: 70/30 insulin- 17 units bidwc    Novolog Sensitive SSI TID AC + HS     -Note patient transitioned off IV insulin drip yesterday afternoon.  Was given 5 units Lantus at 5PM and started on Novolog SSI at 7PM.  -Note CBG this AM was 400 mg/dl.  Patient treated with 10 units Novolog and then given 10 units NPH insulin at 9AM.  70/30 insulin to start tonight at supper (5PM).    MD- Unsure of how much 70/30 insulin patient will actually need.  Per conversation with patient yesterday, patient stated he stopped taking his 70/30 insulin about 2 months ago b/c he couldn't afford it (see my note from 07/26 for further details).  If this is true, patient is likely somewhere in between a Type 1 and a Type 2 diabetic but he definitely needs insulin at time of d/c.   Would you consider increasing Novolog SSI to Moderate scale TID AC + HS?  Also, please make sure to d/c patient home (when that time comes) on Reli-On 70/30 insulin from [place] (Order [phone]).  Patient can purchase that insulin at [facility] for $25 per vial.    Given history of non-adherence, 70/30 insulin bidwc may be the easiest insulin option available for patient at present as well.    Will follow Ambrose Finland RN, MSN, CDE Diabetes Coordinator Inpatient Glycemic Control Team Team Pager: [phone] (8a-5p)

## 2014-11-22 NOTE — Progress Notes (Signed)
Patient's CBG-400, patient in no distress, Dr. Malachi Bonds notified and order written. Will continue to assess patient.

## 2014-11-22 NOTE — Progress Notes (Signed)
[facility] PROGRESS NOTE  MERCURY ROCK RJJ:[id number] DOB: 1990/07/02 DOA: 11/18/2014 PCP: Doris Cheadle, MD  Brief Summary  24 year old male with past medical history of diabetes mellitus who presented to [address] ED with altered mental status and respiratory distress. Per ED report, the patient had worsening shortness of breath over the past 24 hours prior to this admission but no associated fevers or chills or cough, abdominal pain, nausea or vomiting.  In ED, BP was 124/57, HR was 124, RR 27-35, oxygen saturation was 89-100% on room air. Blood work showed WBC count 21.6, sodium 128, creatinine 1.61. His UDS and alcohol level were WNL. ABG showed pO2 of 38. He was profoundly Yifan Auker of breath but CXR was negative for acute cardiopulmonary infection. He was placed on vanco and zosyn for sepsis due to possible pneumonia. In addition, his CBG was 558 with CO2 less than 5 and ketones in urine c/w DKA.   Assessment/Plan  Acute encephalopathy / Acute alcohol withdrawal /delirium - Patient required Precedex drip. Precedex drip stopped 11/20/2014 - Mental status significantly improved since the admission. - Alcohol level was within normal limits on the admission.  Active Problems: DKA (diabetic ketoacidoses) / Diabetes mellitus, uncontrolled, normal gap metabolic acidosis, reopened gap this morning - NPH 10 units once - resume 70/30 17 units BID - A1c on this admission is 10.2 indicating poor glycemic control.  - Diabetic coordinator consulted and we appreciate their input. - repeat BMP this evening and if gap closing, continue current therapy, if not improving, will need to resume insulin gtt  Sepsis due to possible pneumonia / Leukocytosis / acute respiratory failure with hypoxia / fever of unknown origin -  Appreciate ID assistance - CT abdomen and chest was done without contrast because patient had episode of emesis after trying to drink the contrast. The findings were consistent with  possible duodenitis or inflammatory bowel disease. - Chest x-ray from 11/19/2014 showed stable low bilateral lung volumes. - Blood cultures to date are negative - Legionella neg and strep pneumonia neg.  -  HIV neg, acute hepatitis panel pending  Hyponatremia, likely from alcohol abuse and hyperglycemia.  Recurred this morning with hyperglycemia - repeat in AM  Acute renal failure - Likely from alcohol abuse, sepsis - Creatinine normalized with IV fluids  Thrombocytopenia - Likely bone marrow suppression from alcohol abuse - Patient is on SCDs for DVT prophylaxis - Platelet count is improving  Hypokalemia / hypomagnesemia - Due to DKA and alcohol abuse - Supplemented  Candidal esophagitis and faint oral thrush -  Start fluconazole  Diet:  diabetic Access:  PIV IVF:  yes Proph:  lovenox  Code Status: full Family Communication: patient alone Disposition Plan: pending gap closed again   IV access:  Peripheral IV  Procedures and diagnostic studies:   Dg Chest 2 View 11/18/2014 No acute disease. Electronically Signed By: Drusilla Kanner M.D. On: 11/18/2014 09:19   Dg Chest Port 1 View 11/19/2014 Stable low bilateral lung volumes. Electronically Signed By: Irish Lack M.D. On: 11/19/2014 09:22   Dg Abd Portable 1v 11/19/2014 Negative exam. Electronically Signed By: Drusilla Kanner M.D. On: 11/19/2014 09:23    Medical Consultants:  PCCM Infectious disease, Dr. Enedina Finner   Other Consultants:  Diabetic coordinator  IAnti-Infectives:   Vanc 7/23 >  Zosyn 7/23 >       HPI/Subjective:  Has very sore throat and pain with swallowing in his chest.  Denies nausea, vomiting, and feels very hungry this morning.  Having  some sinus congestion and cough.  Objective: Filed Vitals:   11/21/14 2100 11/22/14 0419 11/22/14 0929 11/22/14 1355  BP: 116/76 115/85 128/91 120/74  Pulse: 98 99 124 99  Temp: 98.5 F (36.9 C) 98.5 F  (36.9 C) 97.6 F (36.4 C) 98.6 F (37 C)  TempSrc: Oral Oral Oral Oral  Resp: 22 19 22 14   Height:      Weight:      SpO2: 100% 100%  100%    Intake/Output Summary (Last 24 hours) at 11/22/14 1749 Last data filed at 11/22/14 0420  Gross per 24 hour  Intake 1668.33 ml  Output      0 ml  Net 1668.33 ml   Filed Weights   11/18/14 0925 11/19/14 0415 11/20/14 0437  Weight: 70.3 kg (154 lb 15.7 oz) 71.7 kg (158 lb 1.1 oz) 74.4 kg (164 lb 0.4 oz)   Body mass index is 26.49 kg/(m^2).  Exam:   General: Adult male, NAD  HEENT: NCAT, MMM, no nuchal rigidity, faint white plaques on soft palate  Cardiovascular: RRR, nl S1, S2 no mrg, 2+ pulses, warm extremities  Respiratory: CTAB, no increased WOB  Abdomen: NABS, soft, nondistended, NT  MSK: Normal tone and bulk, no LEE  Neuro: Grossly moves all extremities  Data Reviewed: Basic Metabolic Panel:  Recent Labs Lab 11/19/14 0722 11/19/14 1302 11/19/14 1906 11/20/14 0340 11/20/14 2100 11/21/14 0336 11/21/14 1240 11/22/14 0830  NA 131* 133* 131* 132*  --  137 133* 132*  K 2.6* 2.9* 2.9* 2.9*  --  2.6* 2.9* 5.2*  CL 108 110 110 111  --  110 108 103  CO2 12* 12* 13* 14*  --  17* 18* 11*  GLUCOSE 119* 163* 179* 176*  --  137* 147* 419*  BUN 16 19 19 17   --  11 11 12   CREATININE 1.29* 1.23 1.24 1.19  --  1.04 1.23 1.39*  CALCIUM 8.8* 8.7* 8.5* 8.4*  --  8.4* 8.5* 9.4  MG 1.5*  --   --  2.0 1.7 1.6*  --   --   PHOS  --  <1.0*  --  <0.1* 1.6* 1.3*  --   --    Liver Function Tests: No results for input(s): AST, ALT, ALKPHOS, BILITOT, PROT, ALBUMIN in the last 168 hours.  Recent Labs Lab 11/21/14 2120  LIPASE 50  AMYLASE 45   No results for input(s): AMMONIA in the last 168 hours. CBC:  Recent Labs Lab 11/18/14 0524 11/19/14 0722 11/20/14 0340 11/21/14 0336 11/22/14 0514  WBC 21.6* 5.4 6.1 7.3 8.3  NEUTROABS 17.0* 4.1  --   --   --   HGB 16.5 14.2 12.7* 12.4* 13.4  HCT 46.0 38.6* 34.5* 33.6* 38.3*   MCV 102.2* 95.3 94.0 94.9 98.0  PLT 232 106* 104* 116* 136*    Recent Results (from the past 240 hour(s))  MRSA PCR Screening     Status: Abnormal   Collection Time: 11/18/14  9:00 AM  Result Value Ref Range Status   MRSA by PCR POSITIVE (A) NEGATIVE Final    Comment:        The GeneXpert MRSA Assay (FDA approved for NASAL specimens only), is one component of a comprehensive MRSA colonization surveillance program. It is not intended to diagnose MRSA infection nor to guide or monitor treatment for MRSA infections. CRITICAL RESULT CALLED TO, READ BACK BY AND VERIFIED WITH: A.ASHLEY AT 1207 ON 11/18/14 BY S.VANHOORNE   Culture, blood (x 2)  Status: None (Preliminary result)   Collection Time: 11/18/14 10:10 AM  Result Value Ref Range Status   Specimen Description BLOOD LEFT ARM  Final   Special Requests BOTTLES DRAWN AEROBIC ONLY 7CC  Final   Culture   Final    NO GROWTH 4 DAYS Performed at [facility]    Report Status PENDING  Incomplete  Culture, blood (x 2)     Status: None (Preliminary result)   Collection Time: 11/18/14 10:32 AM  Result Value Ref Range Status   Specimen Description BLOOD RIGHT HAND  Final   Special Requests BOTTLES DRAWN AEROBIC AND ANAEROBIC 10CC  Final   Culture   Final    NO GROWTH 4 DAYS Performed at [facility]    Report Status PENDING  Incomplete  Culture, blood (routine x 2)     Status: None (Preliminary result)   Collection Time: 11/20/14 12:20 PM  Result Value Ref Range Status   Specimen Description BLOOD RIGHT ARM  Final   Special Requests BOTTLES DRAWN AEROBIC AND ANAEROBIC 5CC  Final   Culture   Final    NO GROWTH 2 DAYS Performed at [facility]    Report Status PENDING  Incomplete  Culture, Urine     Status: None (Preliminary result)   Collection Time: 11/20/14  4:15 PM  Result Value Ref Range Status   Specimen Description URINE, CLEAN CATCH  Final   Special Requests NONE  Final   Culture   Final     NO GROWTH < 24 HOURS Performed at [facility]    Report Status PENDING  Incomplete  Culture, blood (routine x 2)     Status: None (Preliminary result)   Collection Time: 11/20/14  5:50 PM  Result Value Ref Range Status   Specimen Description BLOOD LEFT ARM  Final   Special Requests IN PEDIATRIC BOTTLE 4CC  Final   Culture   Final    NO GROWTH 2 DAYS Performed at [facility]    Report Status PENDING  Incomplete  Clostridium Difficile by PCR (not at [facility])     Status: None   Collection Time: 11/22/14  1:23 PM  Result Value Ref Range Status   C difficile by pcr NEGATIVE NEGATIVE Final     Studies: No results found.  Scheduled Meds: . antiseptic oral rinse  7 mL Mouth Rinse q12n4p  . chlorhexidine  15 mL Mouth Rinse BID  . Chlorhexidine Gluconate Cloth  6 each Topical Q0600  . fluconazole  200 mg Oral Daily  . folic acid  1 mg Oral Daily  . insulin aspart  0-5 Units Subcutaneous QHS  . insulin aspart  0-9 Units Subcutaneous TID WC  . insulin aspart protamine- aspart  17 Units Subcutaneous BID WC  . multivitamin with minerals  1 tablet Oral Daily  . mupirocin ointment  1 application Nasal BID  . pantoprazole (PROTONIX) IV  40 mg Intravenous Q12H  . piperacillin-tazobactam (ZOSYN)  IV  3.375 g Intravenous Q8H  . thiamine  100 mg Oral Daily   Or  . thiamine  100 mg Intravenous Daily   Continuous Infusions:   Active Problems:   DKA (diabetic ketoacidoses)   SIRS (systemic inflammatory response syndrome)   Hypokalemia   Hypomagnesemia   AKI (acute kidney injury)   Alcohol withdrawal   Diabetic ketoacidosis without coma associated with type 1 diabetes mellitus   Blood poisoning   FUO (fever of unknown origin)   Fever of unknown  origin (FUO)    Time spent: 30 min    Finola Rosal, [facility]  [facility] Pager [phone]. If 7PM-7AM, please contact night-coverage at [email or web address], password [facility] 11/22/2014, 5:49 PM  LOS: 4 days

## 2014-11-23 DIAGNOSIS — A419 Sepsis, unspecified organism: Principal | ICD-10-CM

## 2014-11-23 LAB — BASIC METABOLIC PANEL
Anion gap: 13 (ref 5–15)
BUN: 16 mg/dL (ref 6–20)
CO2: 20 mmol/L — ABNORMAL LOW (ref 22–32)
Calcium: 9.1 mg/dL (ref 8.9–10.3)
Chloride: 99 mmol/L — ABNORMAL LOW (ref 101–111)
Creatinine, Ser: 1.23 mg/dL (ref 0.61–1.24)
GFR calc Af Amer: >60 mL/min (ref >60)
GFR calc non Af Amer: >60 mL/min (ref >60)
GLUCOSE: 248 mg/dL — AB (ref 65–99)
POTASSIUM: 3.8 mmol/L (ref 3.5–5.1)
Sodium: 132 mmol/L — ABNORMAL LOW (ref 135–145)

## 2014-11-23 LAB — URINE CULTURE: CULTURE: NO GROWTH

## 2014-11-23 LAB — CULTURE, BLOOD (ROUTINE X 2)
CULTURE: NO GROWTH
Culture: NO GROWTH

## 2014-11-23 LAB — CBC
HCT: 37.5 % — ABNORMAL LOW (ref 39.0–52.0)
Hemoglobin: 13.2 g/dL (ref 13.0–17.0)
MCH: 35.1 pg — ABNORMAL HIGH (ref 26.0–34.0)
MCHC: 35.2 g/dL (ref 30.0–36.0)
MCV: 99.7 fL (ref 78.0–100.0)
Platelets: 213 10*3/uL (ref 150–400)
RBC: 3.76 MIL/uL — AB (ref 4.22–5.81)
RDW: 13.2 % (ref 11.5–15.5)
WBC: 7.3 10*3/uL (ref 4.0–10.5)

## 2014-11-23 LAB — GLUCOSE, CAPILLARY
GLUCOSE-CAPILLARY: 318 mg/dL — AB (ref 65–99)
Glucose-Capillary: 275 mg/dL — ABNORMAL HIGH (ref 65–99)
Glucose-Capillary: 238 mg/dL — ABNORMAL HIGH (ref 65–99)
Glucose-Capillary: 194 mg/dL — ABNORMAL HIGH (ref 65–99)

## 2014-11-23 LAB — HEPATITIS PANEL, ACUTE
HCV Ab: <0.1 s/co ratio (ref 0.0–0.9)
HEP B S AG: NEGATIVE
Hep A IgM: NEGATIVE
Hep B C IgM: NEGATIVE

## 2014-11-23 MED ORDER — PANTOPRAZOLE SODIUM 40 MG PO TBEC
40.0000 mg | DELAYED_RELEASE_TABLET | Freq: Two times a day (BID) | ORAL | Status: DC
  Administered 2014-11-23 – 2014-11-24 (×2): 40 mg via ORAL
  Filled 2014-11-23 (×2): qty 1

## 2014-11-23 MED ORDER — INSULIN ASPART PROT & ASPART (70-30 MIX) 100 UNIT/ML ~~LOC~~ SUSP
20.0000 [IU] | Freq: Two times a day (BID) | SUBCUTANEOUS | Status: DC
  Administered 2014-11-23 (×2): 20 [IU] via SUBCUTANEOUS
  Filled 2014-11-23: qty 10

## 2014-11-23 MED ORDER — ACETAMINOPHEN 325 MG PO TABS
650.0000 mg | ORAL_TABLET | ORAL | Status: DC | PRN
  Administered 2014-11-23 – 2014-11-24 (×2): 650 mg via ORAL
  Filled 2014-11-23 (×2): qty 2

## 2014-11-23 NOTE — Progress Notes (Signed)
Appointment August 4th at [facility].  Pt is aware.

## 2014-11-23 NOTE — Progress Notes (Signed)
PHARMACIST - PHYSICIAN COMMUNICATION Key Points: Use following P&T approved IV to PO antibiotic change policy. Note: Policy Excludes:  Esophagectomy patients  DR: TR[facility] CONCERNING: IV to Oral Route Change Policy  RECOMMENDATION: This patient is receiving pantoprazole by the intravenous route.  Based on criteria approved by the Pharmacy and Therapeutics Committee, the intravenous medication(s) is/are being converted to the equivalent oral dose form(s).   DESCRIPTION: These criteria include:  The patient is eating (either orally or via tube) and/or has been taking other orally administered medications for a least 24 hours  The patient has no evidence of active gastrointestinal bleeding or impaired GI absorption (gastrectomy, short bowel, patient on TNA or NPO).  If you have questions about this conversion, please contact the Pharmacy Department    [phone] )  [facility]   [phone] )  [facility]   [phone] )  [facility]   [phone] )  [facility]   [phone] )  Ilene Qua   Juliette Alcide, PharmD, BCPS.   Pager: [phone]  11/23/2014 12:19 PM

## 2014-11-23 NOTE — Care Management (Signed)
This Case Manager spoke with Susanne Borders, RN CM about patient's need for hospital follow-up appointment at [facility] and [facility]. Appointment obtained on 11/30/14 at 1400 with Dr. Orpah Cobb. Appointment placed on AVS.  Inpatient Case Manager updated.

## 2014-11-23 NOTE — Progress Notes (Signed)
[facility] PROGRESS NOTE  DANYON MCGINNESS ZOX:[id number] DOB: 1991/03/18 DOA: 11/18/2014 PCP: Doris Cheadle, MD  Brief Summary  24 year old male with past medical history of diabetes mellitus who presented to [facility] ED with altered mental status and respiratory distress. Per ED report, the patient had worsening shortness of breath over the past 24 hours prior to this admission but no associated fevers or chills or cough, abdominal pain, nausea or vomiting.  In ED, BP was 124/57, HR was 124, RR 27-35, oxygen saturation was 89-100% on room air. Blood work showed WBC count 21.6, sodium 128, creatinine 1.61. His UDS and alcohol level were WNL. ABG showed pO2 of 38. He was profoundly Thomasena Vandenheuvel of breath but CXR was negative for acute cardiopulmonary infection. He was placed on vanco and zosyn for sepsis due to possible pneumonia. In addition, his CBG was 558 with CO2 less than 5 and ketones in urine c/w DKA.   Assessment/Plan  Acute encephalopathy / Acute alcohol withdrawal /delirium - Patient required Precedex drip. Precedex drip stopped 11/20/2014 - Mental status significantly improved since the admission. - Alcohol level was within normal limits on the admission.  Active Problems: DKA (diabetic ketoacidoses) / Diabetes mellitus, uncontrolled, normal gap metabolic acidosis, gap closed again - increase 70/30 20 units BID - A1c on this admission is 10.2 indicating poor glycemic control.  - Diabetic coordinator consulted and we appreciate their input.  Sepsis due to possible pneumonia / Leukocytosis / acute respiratory failure with hypoxia / fever of unknown origin -  Appreciate ID assistance - CT abdomen and chest:  duodenitis or inflammatory bowel disease. - Chest x-ray from 11/19/2014 showed stable low bilateral lung volumes. - Blood cultures to date are negative - Legionella neg and strep pneumonia neg.  -  HIV neg, acute hepatitis panel pending -  Continuing antibiotics per ID  recommendations > would like to narrow to oral abx to complete course, day 6 of zosyn -  Needs GI follow up  Hyponatremia, likely from alcohol abuse and hyperglycemia.  Recurred this morning with hyperglycemia - repeat in AM  Acute renal failure, likely from alcohol abuse, sepsis, and resolved with IVF  Thrombocytopenia, due to bone marrow suppression from alcohol abuse and acute phase reactant, resolved.  Hypokalemia / hypomagnesemia - Due to DKA and alcohol abuse - Supplemented  Candidal esophagitis and faint oral thrush -  Fluconazole day 2  Diet:  diabetic Access:  PIV IVF:  off Proph:  lovenox  Code Status: full Family Communication: patient alone Disposition Plan: pending gap closed again   IV access:  Peripheral IV  Procedures and diagnostic studies:   Dg Chest 2 View 11/18/2014 No acute disease. Electronically Signed By: Drusilla Kanner M.D. On: 11/18/2014 09:19   Dg Chest Port 1 View 11/19/2014 Stable low bilateral lung volumes. Electronically Signed By: Irish Lack M.D. On: 11/19/2014 09:22   Dg Abd Portable 1v 11/19/2014 Negative exam. Electronically Signed By: Drusilla Kanner M.D. On: 11/19/2014 09:23    Medical Consultants:  PCCM Infectious disease, Dr. Enedina Finner   Other Consultants:  Diabetic coordinator  IAnti-Infectives:   Vanc 7/23 > 7/26 Zosyn 7/23 >       HPI/Subjective:  Swallowing has improved.  Denies nausea, vomiting, and feels very hungry this morning.  Walking halls and has some pain in his calves  Objective: Filed Vitals:   11/22/14 1355 11/22/14 2049 11/23/14 0447 11/23/14 1310  BP: 120/74 112/71 115/70 130/89  Pulse: 99 96 85 100  Temp: 98.6 F (  37 C) 98.4 F (36.9 C) 98.2 F (36.8 C) 99.3 F (37.4 C)  TempSrc: Oral Oral Oral Oral  Resp: 14 20 18 16   Height:      Weight:      SpO2: 100% 100% 100% 99%    Intake/Output Summary (Last 24 hours) at 11/23/14 1417 Last data filed  at 11/23/14 1030  Gross per 24 hour  Intake    460 ml  Output      5 ml  Net    455 ml   Filed Weights   11/18/14 0925 11/19/14 0415 11/20/14 0437  Weight: 70.3 kg (154 lb 15.7 oz) 71.7 kg (158 lb 1.1 oz) 74.4 kg (164 lb 0.4 oz)   Body mass index is 26.49 kg/(m^2).  Exam:   General: Adult male, NAD  HEENT: NCAT, MMM, mild erythema in small areas on soft palate, no further white plaques  Cardiovascular: RRR, nl S1, S2 no mrg, 2+ pulses, warm extremities  Respiratory: CTAB, no increased WOB  Abdomen: NABS, soft, nondistended, NT  MSK: Normal tone and bulk, no LEE  Neuro: Grossly moves all extremities  Data Reviewed: Basic Metabolic Panel:  Recent Labs Lab 11/19/14 0722 11/19/14 1302  11/20/14 0340 11/20/14 2100 11/21/14 0336 11/21/14 1240 11/22/14 0830 11/22/14 1935 11/23/14 0454  NA 131* 133*  < > 132*  --  137 133* 132* 130* 132*  K 2.6* 2.9*  < > 2.9*  --  2.6* 2.9* 5.2* 3.8 3.8  CL 108 110  < > 111  --  110 108 103 100* 99*  CO2 12* 12*  < > 14*  --  17* 18* 11* 20* 20*  GLUCOSE 119* 163*  < > 176*  --  137* 147* 419* 232* 248*  BUN 16 19  < > 17  --  11 11 12 14 16   CREATININE 1.29* 1.23  < > 1.19  --  1.04 1.23 1.39* 1.16 1.23  CALCIUM 8.8* 8.7*  < > 8.4*  --  8.4* 8.5* 9.4 9.1 9.1  MG 1.5*  --   --  2.0 1.7 1.6*  --   --   --   --   PHOS  --  <1.0*  --  <0.1* 1.6* 1.3*  --   --   --   --   < > = values in this interval not displayed. Liver Function Tests: No results for input(s): AST, ALT, ALKPHOS, BILITOT, PROT, ALBUMIN in the last 168 hours.  Recent Labs Lab 11/21/14 2120  LIPASE 50  AMYLASE 45   No results for input(s): AMMONIA in the last 168 hours. CBC:  Recent Labs Lab 11/18/14 0524 11/19/14 0722 11/20/14 0340 11/21/14 0336 11/22/14 0514 11/23/14 0454  WBC 21.6* 5.4 6.1 7.3 8.3 7.3  NEUTROABS 17.0* 4.1  --   --   --   --   HGB 16.5 14.2 12.7* 12.4* 13.4 13.2  HCT 46.0 38.6* 34.5* 33.6* 38.3* 37.5*  MCV 102.2* 95.3 94.0  94.9 98.0 99.7  PLT 232 106* 104* 116* 136* 213    Recent Results (from the past 240 hour(s))  MRSA PCR Screening     Status: Abnormal   Collection Time: 11/18/14  9:00 AM  Result Value Ref Range Status   MRSA by PCR POSITIVE (A) NEGATIVE Final    Comment:        The GeneXpert MRSA Assay (FDA approved for NASAL specimens only), is one component of a comprehensive MRSA colonization surveillance program. It is not  intended to diagnose MRSA infection nor to guide or monitor treatment for MRSA infections. CRITICAL RESULT CALLED TO, READ BACK BY AND VERIFIED WITH: A.ASHLEY AT 1207 ON 11/18/14 BY S.VANHOORNE   Culture, blood (x 2)     Status: None   Collection Time: 11/18/14 10:10 AM  Result Value Ref Range Status   Specimen Description BLOOD LEFT ARM  Final   Special Requests BOTTLES DRAWN AEROBIC ONLY 7CC  Final   Culture   Final    NO GROWTH 5 DAYS Performed at [facility]    Report Status 11/23/2014 FINAL  Final  Culture, blood (x 2)     Status: None   Collection Time: 11/18/14 10:32 AM  Result Value Ref Range Status   Specimen Description BLOOD RIGHT HAND  Final   Special Requests BOTTLES DRAWN AEROBIC AND ANAEROBIC 10CC  Final   Culture   Final    NO GROWTH 5 DAYS Performed at [facility]    Report Status 11/23/2014 FINAL  Final  Culture, blood (routine x 2)     Status: None (Preliminary result)   Collection Time: 11/20/14 12:20 PM  Result Value Ref Range Status   Specimen Description BLOOD RIGHT ARM  Final   Special Requests BOTTLES DRAWN AEROBIC AND ANAEROBIC 5CC  Final   Culture   Final    NO GROWTH 3 DAYS Performed at [facility]    Report Status PENDING  Incomplete  Culture, Urine     Status: None   Collection Time: 11/20/14  4:15 PM  Result Value Ref Range Status   Specimen Description URINE, CLEAN CATCH  Final   Special Requests NONE  Final   Culture   Final    NO GROWTH 2 DAYS Performed at [facility]    Report Status  11/23/2014 FINAL  Final  Culture, blood (routine x 2)     Status: None (Preliminary result)   Collection Time: 11/20/14  5:50 PM  Result Value Ref Range Status   Specimen Description BLOOD LEFT ARM  Final   Special Requests IN PEDIATRIC BOTTLE 4CC  Final   Culture   Final    NO GROWTH 3 DAYS Performed at [facility]    Report Status PENDING  Incomplete  Clostridium Difficile by PCR (not at [facility])     Status: None   Collection Time: 11/22/14  1:23 PM  Result Value Ref Range Status   C difficile by pcr NEGATIVE NEGATIVE Final     Studies: No results found.  Scheduled Meds: . chlorhexidine  15 mL Mouth Rinse BID  . fluconazole  200 mg Oral Daily  . folic acid  1 mg Oral Daily  . insulin aspart  0-5 Units Subcutaneous QHS  . insulin aspart  0-9 Units Subcutaneous TID WC  . insulin aspart protamine- aspart  20 Units Subcutaneous BID WC  . multivitamin with minerals  1 tablet Oral Daily  . pantoprazole  40 mg Oral BID  . piperacillin-tazobactam (ZOSYN)  IV  3.375 g Intravenous Q8H  . thiamine  100 mg Oral Daily   Or  . thiamine  100 mg Intravenous Daily   Continuous Infusions:   Active Problems:   DKA (diabetic ketoacidoses)   SIRS (systemic inflammatory response syndrome)   Hypokalemia   Hypomagnesemia   AKI (acute kidney injury)   Alcohol withdrawal   Diabetic ketoacidosis without coma associated with type 1 diabetes mellitus   Blood poisoning   FUO (fever of unknown origin)  Fever of unknown origin (FUO)   Abdominal pain    Time spent: 30 min    Shawnika Pepin, [facility]  [facility] Pager [phone]. If 7PM-7AM, please contact night-coverage at [email or web address], password [facility] 11/23/2014, 2:17 PM  LOS: 5 days

## 2014-11-24 LAB — BASIC METABOLIC PANEL
Anion gap: 12 (ref 5–15)
BUN: 17 mg/dL (ref 6–20)
CHLORIDE: 99 mmol/L — AB (ref 101–111)
CO2: 22 mmol/L (ref 22–32)
Calcium: 8.5 mg/dL — ABNORMAL LOW (ref 8.9–10.3)
Creatinine, Ser: 1.12 mg/dL (ref 0.61–1.24)
GFR calc Af Amer: >60 mL/min (ref >60)
Glucose, Bld: 244 mg/dL — ABNORMAL HIGH (ref 65–99)
POTASSIUM: 3.5 mmol/L (ref 3.5–5.1)
SODIUM: 133 mmol/L — AB (ref 135–145)

## 2014-11-24 LAB — GLUCOSE, CAPILLARY
GLUCOSE-CAPILLARY: 235 mg/dL — AB (ref 65–99)
Glucose-Capillary: 294 mg/dL — ABNORMAL HIGH (ref 65–99)

## 2014-11-24 LAB — CBC
HCT: 35.9 % — ABNORMAL LOW (ref 39.0–52.0)
HEMOGLOBIN: 12.3 g/dL — AB (ref 13.0–17.0)
MCH: 34.2 pg — AB (ref 26.0–34.0)
MCHC: 34.3 g/dL (ref 30.0–36.0)
MCV: 99.7 fL (ref 78.0–100.0)
PLATELETS: 264 10*3/uL (ref 150–400)
RBC: 3.6 MIL/uL — ABNORMAL LOW (ref 4.22–5.81)
RDW: 12.8 % (ref 11.5–15.5)
WBC: 7.6 10*3/uL (ref 4.0–10.5)

## 2014-11-24 MED ORDER — BLOOD GLUCOSE METER KIT: PACK | Status: AC

## 2014-11-24 MED ORDER — FLUCONAZOLE 200 MG PO TABS: 200.0000 mg | ORAL_TABLET | Freq: Every day | ORAL | Status: DC

## 2014-11-24 MED ORDER — INSULIN NPH ISOPHANE & REGULAR (70-30) 100 UNIT/ML ~~LOC~~ SUSP: 28.0000 [IU] | Freq: Two times a day (BID) | SUBCUTANEOUS | Status: DC

## 2014-11-24 MED ORDER — INSULIN ASPART PROT & ASPART (70-30 MIX) 100 UNIT/ML ~~LOC~~ SUSP
28.0000 [IU] | Freq: Two times a day (BID) | SUBCUTANEOUS | Status: DC
  Administered 2014-11-24: 28 [IU] via SUBCUTANEOUS
  Filled 2014-11-24: qty 10

## 2014-11-24 MED ORDER — PANTOPRAZOLE SODIUM 40 MG PO TBEC: 40.0000 mg | DELAYED_RELEASE_TABLET | Freq: Two times a day (BID) | ORAL | Status: DC

## 2014-11-24 NOTE — Progress Notes (Signed)
ANTIBIOTIC CONSULT NOTE - FOLLOW UP  Pharmacy Consult for Zosyn Indication: r/o sepsis  No Known Allergies  Patient Measurements: Height: 5\' 6"  (167.6 cm) Weight: 164 lb 0.4 oz (74.4 kg) IBW/kg (Calculated) : 63.8  Vital Signs: Temp: 98.2 F (36.8 C) (07/29 1013) Temp Source: Oral (07/29 1013) BP: 126/81 mmHg (07/29 1013) Pulse Rate: 93 (07/29 1013) Intake/Output from previous day: 07/28 0701 - 07/29 0700 In: 530 [P.O.:480; IV Piggyback:50] Out: -  Intake/Output from this shift: Total I/O In: 240 [P.O.:240] Out: -   Labs:  Recent Labs  11/22/14 0514  11/22/14 1935 11/23/14 0454 11/24/14 0430  WBC 8.3  --   --  7.3 7.6  HGB 13.4  --   --  13.2 12.3*  PLT 136*  --   --  213 264  CREATININE  --   < > 1.16 1.23 1.12  < > = values in this interval not displayed. Estimated Creatinine Clearance: 91.8 mL/min (by C-G formula based on Cr of 1.12). No results for input(s): VANCOTROUGH, VANCOPEAK, VANCORANDOM, GENTTROUGH, GENTPEAK, GENTRANDOM, TOBRATROUGH, TOBRAPEAK, TOBRARND, AMIKACINPEAK, AMIKACINTROU, AMIKACIN in the last 72 hours.   Microbiology: Recent Results (from the past 720 hour(s))  MRSA PCR Screening     Status: Abnormal   Collection Time: 11/18/14  9:00 AM  Result Value Ref Range Status   MRSA by PCR POSITIVE (A) NEGATIVE Final    Comment:        The GeneXpert MRSA Assay (FDA approved for NASAL specimens only), is one component of a comprehensive MRSA colonization surveillance program. It is not intended to diagnose MRSA infection nor to guide or monitor treatment for MRSA infections. CRITICAL RESULT CALLED TO, READ BACK BY AND VERIFIED WITH: A.ASHLEY AT 1207 ON 11/18/14 BY S.VANHOORNE   Culture, blood (x 2)     Status: None   Collection Time: 11/18/14 10:10 AM  Result Value Ref Range Status   Specimen Description BLOOD LEFT ARM  Final   Special Requests BOTTLES DRAWN AEROBIC ONLY 7CC  Final   Culture   Final    NO GROWTH 5 DAYS Performed at [facility]    Report Status 11/23/2014 FINAL  Final  Culture, blood (x 2)     Status: None   Collection Time: 11/18/14 10:32 AM  Result Value Ref Range Status   Specimen Description BLOOD RIGHT HAND  Final   Special Requests BOTTLES DRAWN AEROBIC AND ANAEROBIC 10CC  Final   Culture   Final    NO GROWTH 5 DAYS Performed at [facility]    Report Status 11/23/2014 FINAL  Final  Culture, blood (routine x 2)     Status: None (Preliminary result)   Collection Time: 11/20/14 12:20 PM  Result Value Ref Range Status   Specimen Description BLOOD RIGHT ARM  Final   Special Requests BOTTLES DRAWN AEROBIC AND ANAEROBIC 5CC  Final   Culture   Final    NO GROWTH 3 DAYS Performed at [facility]    Report Status PENDING  Incomplete  Culture, Urine     Status: None   Collection Time: 11/20/14  4:15 PM  Result Value Ref Range Status   Specimen Description URINE, CLEAN CATCH  Final   Special Requests NONE  Final   Culture   Final    NO GROWTH 2 DAYS Performed at [facility]    Report Status 11/23/2014 FINAL  Final  Culture, blood (routine x 2)     Status: None (Preliminary  result)   Collection Time: 11/20/14  5:50 PM  Result Value Ref Range Status   Specimen Description BLOOD LEFT ARM  Final   Special Requests IN PEDIATRIC BOTTLE 4CC  Final   Culture   Final    NO GROWTH 3 DAYS Performed at [facility]    Report Status PENDING  Incomplete  Clostridium Difficile by PCR (not at [facility])     Status: None   Collection Time: 11/22/14  1:23 PM  Result Value Ref Range Status   C difficile by pcr NEGATIVE NEGATIVE Final    Anti-infectives    Start     Dose/Rate Route Frequency Ordered Stop   11/22/14 1300  fluconazole (DIFLUCAN) tablet 200 mg     200 mg Oral Daily 11/22/14 1219     11/21/14 0800  vancomycin (VANCOCIN) IVPB 1000 mg/200 mL premix  Status:  Discontinued     1,000 mg 200 mL/hr over 60 Minutes Intravenous Every 12 hours 11/21/14 0526 11/21/14  1625   11/18/14 2200  vancomycin (VANCOCIN) IVPB 750 mg/150 ml premix  Status:  Discontinued     750 mg 150 mL/hr over 60 Minutes Intravenous Every 12 hours 11/18/14 0923 11/18/14 1543   11/18/14 2200  vancomycin (VANCOCIN) IVPB 750 mg/150 ml premix  Status:  Discontinued     750 mg 150 mL/hr over 60 Minutes Intravenous Every 12 hours 11/18/14 1557 11/21/14 0525   11/18/14 2000  piperacillin-tazobactam (ZOSYN) IVPB 3.375 g     3.375 g 12.5 mL/hr over 240 Minutes Intravenous Every 8 hours 11/18/14 1557     11/18/14 1600  piperacillin-tazobactam (ZOSYN) IVPB 3.375 g  Status:  Discontinued     3.375 g 12.5 mL/hr over 240 Minutes Intravenous Every 8 hours 11/18/14 0923 11/18/14 0934   11/18/14 1000  vancomycin (VANCOCIN) IVPB 1000 mg/200 mL premix     1,000 mg 200 mL/hr over 60 Minutes Intravenous  Once 11/18/14 0934 11/18/14 1155   11/18/14 1000  piperacillin-tazobactam (ZOSYN) IVPB 3.375 g  Status:  Discontinued     3.375 g 12.5 mL/hr over 240 Minutes Intravenous Every 8 hours 11/18/14 0934 11/18/14 1543   11/18/14 0845  piperacillin-tazobactam (ZOSYN) IVPB 3.375 g  Status:  Discontinued     3.375 g 100 mL/hr over 30 Minutes Intravenous  Once 11/18/14 0839 11/18/14 0934   11/18/14 0845  vancomycin (VANCOCIN) IVPB 1000 mg/200 mL premix  Status:  Discontinued     1,000 mg 200 mL/hr over 60 Minutes Intravenous  Once 11/18/14 0839 11/18/14 0933      Assessment: 23 yoM presents to ED on 7/23 with SOB and noted to be in DKA.  Pharmacy is consulted to dose vancomycin and Zosyn for suspected sepsis.  PCT elevated; LA wnl Temp: afebrile WBC: improved to wnl Renal: AKI resolved  7/23 >> Vanc >> 7/26 7/23 >> Zosyn >>    7/23 MRSA PCR: positive > bactroban 7/23 blood x2: ngtd 7/25 blood: ngtd 7/27 C. Diff: neg S. pneumo UAg: neg Legionella UAg: neg  Goal of Therapy:  Dose for indication and for patient-specific parameters Appropriate antibiotic dosing for indication and renal  function  Plan:  Day 7 antibiotics  Continue Zosyn 3.375 g IV given every 8 hrs by 4-hr infusion   No adjustment needed  Follow plan, ID wants 14-day course of antibiotics, ? OK to change to PO  Christopher Robles, PharmD, BCPS.   Pager: [phone] 11/24/2014, 10:21 AM

## 2014-11-24 NOTE — Discharge Summary (Addendum)
Physician Discharge Summary  Christopher Robles YSA:[id number] DOB: 1990/11/28 DOA: 11/18/2014  PCP: Lorayne Marek, MD  Admit date: 11/18/2014 Discharge date: 11/25/2014  Recommendations for Outpatient Follow-up:  1. F/u with community health and wellness at already scheduled appointment to review diabetes management  Discharge Diagnoses:  Principal Problem:   DKA (diabetic ketoacidoses) Active Problems:   SIRS (systemic inflammatory response syndrome)   Hypokalemia   Hypomagnesemia   AKI (acute kidney injury)   Diabetic ketoacidosis without coma associated with type 1 diabetes mellitus   Blood poisoning   FUO (fever of unknown origin)   Fever of unknown origin (FUO)   Abdominal pain   Acute delirium   Discharge Condition: stable, improved  Diet recommendation: diabetic  Wt Readings from Last 3 Encounters:  11/20/14 74.4 kg (164 lb 0.4 oz)  12/29/13 68.584 kg (151 lb 3.2 oz)  12/19/13 63.866 kg (140 lb 12.8 oz)    History of present illness:  24 year old male with past medical history of diabetes mellitus (probably somewhere between a type 1 and 2) who presented to [facility] ED with altered mental status and respiratory distress. Per ED report, the patient had worsening shortness of breath over the past 24 hours prior to this admission but no associated fevers or chills or cough, abdominal pain, nausea or vomiting.  In ED, BP was 124/57, HR was 124, RR 27-35, oxygen saturation was 89-100% on room air. Blood work showed WBC count 21.6, sodium 128, creatinine 1.61. His UDS and alcohol level were WNL. ABG showed pO2 of 38. He was profoundly Treasa Bradshaw of breath but CXR was negative for acute cardiopulmonary infection. He was placed on vanco and zosyn for sepsis due to possible pneumonia. In addition, his CBG was 558 with CO2 less than 5 and ketones in urine c/w DKA.   Hospital Course:   Delirium was likely related to SIRS and DKA and severe illness.  Initially there was concern for alcohol  withdrawal given his degree of agitation and inability to question him about alcohol use.  He required admission to the stepdown unit and a Precedex drip which was stopped 11/20/2014.  His mentation gradually improved.  After he awoke, he denied alcohol and illicit drug use.  Additionally, drug screen and alcohol level at admission were negative.  His mentation gradually improved and he was interacting normally at the time of discharge.     Active Problems: DKA (diabetic ketoacidoses) / Diabetes mellitus more similar to type 2 than type 1 but features of both, uncontrolled.  He was initially placed on insulin gtt but was transitioned back to 70/30 insulin at the time of discharge with moderate control.  His dose was increased to 28 units twice daily.  He was seen by the diabetic educator and will follow up with community health and wellness for ongoing management.  A1c was 10.2.    Sepsis and acute hypoxic respiratory failure due to possible pneumonia.  He had high fevers initially with negative UA and CXR.  He was seen by infectious disease due to the persistence of his fevers despite antibiotics/FUO.  Blood cultures were negative.  Legionella neg and strep pneumonia neg. HIV neg, acute hepatitis panel neg.  CT abdomen and chest: duodenitis or inflammatory bowel disease.  He completed 7 days of antibiotics in-hospital and should take PPI and follow up with gastroenterology as an outpatient within the next month.  Referral from PCP.    Hyponatremia, likely from alcohol abuse and hyperglycemia and gradually resolved with  treatment of diabetes and with IVF.  Acute renal failure, likely from SIRS and dehydration and resolved with IVF.    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  Hypokalemia / hypomagnesemia, due to DKA and resolved with supplementation  Candidal esophagitis and faint oral thrush, Fluconazole day 3 of 14   IV access:   Peripheral IV  Procedures and diagnostic studies:   Dg Chest 2 View 11/18/2014 No acute disease. Electronically Signed By: Inge Rise M.D. On: 11/18/2014 09:19   Dg Chest Port 1 View 11/19/2014 Stable low bilateral lung volumes. Electronically Signed By: Aletta Edouard M.D. On: 11/19/2014 09:22   Dg Abd Portable 1v 11/19/2014 Negative exam. Electronically Signed By: Inge Rise M.D. On: 11/19/2014 09:23    Medical Consultants:  PCCM Infectious disease, Dr. Lita Mains   Other Consultants:  Diabetic coordinator  IAnti-Infectives:   Vanc 7/23 > 7/26 Zosyn 7/23 >           Discharge Exam: Filed Vitals:   11/24/14 1013  BP: 126/81  Pulse: 93  Temp: 98.2 F (36.8 C)  Resp: 16   Filed Vitals:   11/23/14 1750 11/23/14 2027 11/24/14 0545 11/24/14 1013  BP: 121/81 119/97 109/72 126/81  Pulse: 97 107 91 93  Temp: 98.5 F (36.9 C) 97.9 F (36.6 C) 98 F (36.7 C) 98.2 F (36.8 C)  TempSrc: Oral Oral Oral Oral  Resp: _0 Height:      Weight:      SpO2: 100% 100% 100% 100%     General: Adult male, NAD  HEENT: NCAT, MMM, no oral plaques  Cardiovascular: RRR, nl S1, S2 no mrg, 2+ pulses, warm extremities   Respiratory: CTAB, no increased WOB   Abdomen: NABS, soft, nondistended, NT   MSK: Normal tone and bulk, no LEE   Neuro: Grossly moves all extremities, ambulating without difficulty  Discharge Instructions      Discharge Instructions    Call MD for:  difficulty breathing, headache or visual disturbances    Complete by:  As directed      Call MD for:  extreme fatigue    Complete by:  As directed      Call MD for:  hives    Complete by:  As directed      Call MD for:  persistant dizziness or light-headedness    Complete by:  As directed      Call MD for:  persistant nausea and vomiting    Complete by:  As directed      Call MD for:  severe uncontrolled pain    Complete by:  As  directed      Call MD for:  temperature >100.4    Complete by:  As directed      Diet Carb Modified    Complete by:  As directed      Discharge instructions    Complete by:  As directed   You were hospitalized with severe illness and diabetic ketoacidosis.  You need to take insulin twice a day every day to prevent severe illness/acid building up in the blood stream.  Please take your prescription for your blood glucose meter and your insulin to [facility].  Take your other prescriptions for protonix and fluconazole to community health and wellness to see if they can give you any additional assistance with your medications.  For your follow up, please make sure you keep a log of your blood sugars.  Check them four times a day until you follow up with your primary care doctor and bring the record of your blood sugars WITH you to your appointment.  Your doctor will use the numbers to determine what changes need to be made to your insulin to get your diabetes under good control.  For the inflammation of your intestines that was seen on the CT scan you had during this hospitalization, you will need a referral by your primary care doctor to the gastroenterology office.  Please ask about this at your appointment.     Increase activity slowly    Complete by:  As directed             Medication List    TAKE these medications        acetaminophen 500 MG tablet  Commonly known as:  TYLENOL  Take 1,000-2,000 mg by mouth every 6 (six) hours as needed (for pain.).     blood glucose meter kit and supplies  Dispense based on patient and insurance preference. Use up to four times daily as directed. (FOR ICD-9 250.00, 250.01).     fluconazole 200 MG tablet  Commonly known as:  DIFLUCAN  Take 1 tablet (200 mg total) by mouth daily.     insulin NPH-regular Human (70-30) 100 UNIT/ML injection  Commonly known as:  NOVOLIN 70/30 RELION  Inject 28 Units into the skin 2 (two) times daily with a meal.      pantoprazole 40 MG tablet  Commonly known as:  PROTONIX  Take 1 tablet (40 mg total) by mouth 2 (two) times daily.       Follow-up Information    Follow up with [facility]     On 11/30/2014.   Why:  Appointment on 11/30/14 at 2:00 pm with Dr. Annitta Needs.   Contact information:   [address] [place] [address] [phone]       The results of significant diagnostics from this hospitalization (including imaging, microbiology, ancillary and laboratory) are listed below for reference.    Significant Diagnostic Studies: Dg Chest 2 View  11/18/2014   CLINICAL DATA:  Severe shortness of breath today.  EXAM: CHEST  2 VIEW  COMPARISON:  PA and lateral chest 11/15/2005.  FINDINGS: Lung volumes are low but the lungs are clear. Heart size is normal. No pneumothorax or pleural effusion.  IMPRESSION: No acute disease.   Electronically Signed   By: Inge Rise M.D.   On: 11/18/2014 09:19   Ct Chest W Contrast  11/20/2014   CLINICAL DATA:  Continues to spike fevers. Source remains industry. Fever of unknown origin. Blood cultures negative.  EXAM: CT CHEST, ABDOMEN, AND PELVIS WITH CONTRAST  TECHNIQUE: Multidetector CT imaging of the chest, abdomen and pelvis was performed following the standard protocol during bolus administration of intravenous contrast.  CONTRAST:  153m OMNIPAQUE IOHEXOL 300 MG/ML  SOLN  COMPARISON:  Chest x-ray 11/19/2014  FINDINGS: CT CHEST FINDINGS  Heart: Heart size is normal. No imaged pericardial effusion or significant coronary artery calcifications.  Vascular structures: Normal  Mediastinum/thyroid: No mediastinal, hilar, or axillary adenopathy. Thyroid has a normal appearance.  Lungs/Airways: There are bilateral pleural effusions. Bibasilar atelectasis. No suspicious pulmonary nodules.  Chest wall/osseous:  Unremarkable.  CT ABDOMEN AND PELVIS FINDINGS  Upper abdomen: There is geographic distribution of abnormal enhancement of  the liver. Focally there is higher attenuation in the region the gallbladder fossa and I suspect the patient underlying hepatic steatosis  and for fusion abnormality superimposed. No focal liver lesions are identified. There is significant fluid surrounding the pancreas without evidence for focal pancreatic lesion. There is normal enhancement of the pancreas. No focal abnormality identified within the spleen, adrenal glands, or kidneys. The gallbladder is present.  Gastrointestinal tract: The duodenum is of normal. Duodenal wall is thickened and enhances. There is fluid surrounding the duodenum and distal stomach wall. Significant fluid within the retroperitoneum, seen in the left anterior para renal space. The stomach otherwise has a normal appearance. Colonic loops are fluid filled and somewhat distended and otherwise normal.  Pelvis: There is free pelvic fluid. Urinary bladder is distended. Prostate and seminal vesicles normal in appearance.  Retroperitoneum: No retroperitoneal or mesenteric adenopathy. No evidence for aortic aneurysm.  Abdominal wall: Unremarkable.  Osseous structures: Unremarkable.  IMPRESSION: 1. Bilateral pleural effusions and bibasilar atelectasis. 2. Significant edema of the duodenum wall, associated with retroperitoneal fluid. There is no evidence for perforation or abscess. Findings are consistent duodenitis. Consider inflammatory bowel disease. 3. Nonspecific perfusion abnormality of the liver. Also suspect hepatic steatosis. 4. Small amount of free pelvic fluid. 5. Distended urinary bladder.   Electronically Signed   By: Nolon Nations M.D.   On: 11/20/2014 16:51   Ct Abdomen Pelvis W Contrast  11/20/2014   CLINICAL DATA:  Continues to spike fevers. Source remains industry. Fever of unknown origin. Blood cultures negative.  EXAM: CT CHEST, ABDOMEN, AND PELVIS WITH CONTRAST  TECHNIQUE: Multidetector CT imaging of the chest, abdomen and pelvis was performed following the standard  protocol during bolus administration of intravenous contrast.  CONTRAST:  130m OMNIPAQUE IOHEXOL 300 MG/ML  SOLN  COMPARISON:  Chest x-ray 11/19/2014  FINDINGS: CT CHEST FINDINGS  Heart: Heart size is normal. No imaged pericardial effusion or significant coronary artery calcifications.  Vascular structures: Normal  Mediastinum/thyroid: No mediastinal, hilar, or axillary adenopathy. Thyroid has a normal appearance.  Lungs/Airways: There are bilateral pleural effusions. Bibasilar atelectasis. No suspicious pulmonary nodules.  Chest wall/osseous:  Unremarkable.  CT ABDOMEN AND PELVIS FINDINGS  Upper abdomen: There is geographic distribution of abnormal enhancement of the liver. Focally there is higher attenuation in the region the gallbladder fossa and I suspect the patient underlying hepatic steatosis and for fusion abnormality superimposed. No focal liver lesions are identified. There is significant fluid surrounding the pancreas without evidence for focal pancreatic lesion. There is normal enhancement of the pancreas. No focal abnormality identified within the spleen, adrenal glands, or kidneys. The gallbladder is present.  Gastrointestinal tract: The duodenum is of normal. Duodenal wall is thickened and enhances. There is fluid surrounding the duodenum and distal stomach wall. Significant fluid within the retroperitoneum, seen in the left anterior para renal space. The stomach otherwise has a normal appearance. Colonic loops are fluid filled and somewhat distended and otherwise normal.  Pelvis: There is free pelvic fluid. Urinary bladder is distended. Prostate and seminal vesicles normal in appearance.  Retroperitoneum: No retroperitoneal or mesenteric adenopathy. No evidence for aortic aneurysm.  Abdominal wall: Unremarkable.  Osseous structures: Unremarkable.  IMPRESSION: 1. Bilateral pleural effusions and bibasilar atelectasis. 2. Significant edema of the duodenum wall, associated with retroperitoneal fluid.  There is no evidence for perforation or abscess. Findings are consistent duodenitis. Consider inflammatory bowel disease. 3. Nonspecific perfusion abnormality of the liver. Also suspect hepatic steatosis. 4. Small amount of free pelvic fluid. 5. Distended urinary bladder.   Electronically Signed   By: ENolon NationsM.D.   On: 11/20/2014 16:51   Dg  Chest Port 1 View  11/19/2014   CLINICAL DATA:  Dyspnea and abdominal pain.  EXAM: PORTABLE CHEST - 1 VIEW  COMPARISON:  11/18/2014  FINDINGS: Stable low bilateral lung volumes with bibasilar atelectasis. The heart size and mediastinal contours are normal. There is no evidence of pulmonary edema, consolidation, pneumothorax, nodule or pleural fluid.  IMPRESSION: Stable low bilateral lung volumes.   Electronically Signed   By: Aletta Edouard M.D.   On: 11/19/2014 09:22   Dg Abd Portable 1v  11/19/2014   CLINICAL DATA:  Dyspnea and abdominal pain today. Initial encounter.  EXAM: PORTABLE ABDOMEN - 1 VIEW  COMPARISON:  None.  FINDINGS: The bowel gas pattern is nonobstructive. Moderate stool burden throughout the colon is noted. No abnormal abdominal calcification is seen. No bony abnormality is identified.  IMPRESSION: Negative exam.   Electronically Signed   By: Inge Rise M.D.   On: 11/19/2014 09:23    Microbiology: Recent Results (from the past 240 hour(s))  MRSA PCR Screening     Status: Abnormal   Collection Time: 11/18/14  9:00 AM  Result Value Ref Range Status   MRSA by PCR POSITIVE (A) NEGATIVE Final    Comment:        The GeneXpert MRSA Assay (FDA approved for NASAL specimens only), is one component of a comprehensive MRSA colonization surveillance program. It is not intended to diagnose MRSA infection nor to guide or monitor treatment for MRSA infections. CRITICAL RESULT CALLED TO, READ BACK BY AND VERIFIED WITH: A.ASHLEY AT 1207 ON 11/18/14 BY S.VANHOORNE   Culture, blood (x 2)     Status: None   Collection Time: 11/18/14 10:10  AM  Result Value Ref Range Status   Specimen Description BLOOD LEFT ARM  Final   Special Requests BOTTLES DRAWN AEROBIC ONLY [place]  Final   Culture   Final    NO GROWTH 5 DAYS Performed at [facility]    Report Status 11/23/2014 FINAL  Final  Culture, blood (x 2)     Status: None   Collection Time: 11/18/14 10:32 AM  Result Value Ref Range Status   Specimen Description BLOOD RIGHT HAND  Final   Special Requests BOTTLES DRAWN AEROBIC AND ANAEROBIC 10CC  Final   Culture   Final    NO GROWTH 5 DAYS Performed at [facility]    Report Status 11/23/2014 FINAL  Final  Culture, blood (routine x 2)     Status: None   Collection Time: 11/20/14 12:20 PM  Result Value Ref Range Status   Specimen Description BLOOD RIGHT ARM  Final   Special Requests BOTTLES DRAWN AEROBIC AND ANAEROBIC 5CC  Final   Culture   Final    NO GROWTH 5 DAYS Performed at [facility]    Report Status 11/25/2014 FINAL  Final  Culture, Urine     Status: None   Collection Time: 11/20/14  4:15 PM  Result Value Ref Range Status   Specimen Description URINE, CLEAN CATCH  Final   Special Requests NONE  Final   Culture   Final    NO GROWTH 2 DAYS Performed at [facility]    Report Status 11/23/2014 FINAL  Final  Culture, blood (routine x 2)     Status: None   Collection Time: 11/20/14  5:50 PM  Result Value Ref Range Status   Specimen Description BLOOD LEFT ARM  Final   Special Requests IN PEDIATRIC BOTTLE 4CC  Final   Culture  Final    NO GROWTH 5 DAYS Performed at [facility]    Report Status 11/25/2014 FINAL  Final  Clostridium Difficile by PCR (not at [facility])     Status: None   Collection Time: 11/22/14  1:23 PM  Result Value Ref Range Status   C difficile by pcr NEGATIVE NEGATIVE Final     Labs: Basic Metabolic Panel:  Recent Labs Lab 11/19/14 0722 11/19/14 1302  11/20/14 0340 11/20/14 2100 11/21/14 0336 11/21/14 1240 11/22/14 0830 11/22/14 1935  11/23/14 0454 11/24/14 0430  NA 131* 133*  < > 132*  --  137 133* 132* 130* 132* 133*  K 2.6* 2.9*  < > 2.9*  --  2.6* 2.9* 5.2* 3.8 3.8 3.5  CL 108 110  < > 111  --  110 108 103 100* 99* 99*  CO2 12* 12*  < > 14*  --  17* 18* 11* 20* 20* 22  GLUCOSE 119* 163*  < > 176*  --  137* 147* 419* 232* 248* 244*  BUN 16 19  < > 17  --  _0 CREATININE 1.29* 1.23  < > 1.19  --  1.04 1.23 1.39* 1.16 1.23 1.12  CALCIUM 8.8* 8.7*  < > 8.4*  --  8.4* 8.5* 9.4 9.1 9.1 8.5*  MG 1.5*  --   --  2.0 1.7 1.6*  --   --   --   --   --   PHOS  --  <1.0*  --  <0.1* 1.6* 1.3*  --   --   --   --   --   < > = values in this interval not displayed. Liver Function Tests: No results for input(s): AST, ALT, ALKPHOS, BILITOT, PROT, ALBUMIN in the last 168 hours.  Recent Labs Lab 11/21/14 2120  LIPASE 50  AMYLASE 45   No results for input(s): AMMONIA in the last 168 hours. CBC:  Recent Labs Lab 11/19/14 0722 11/20/14 0340 11/21/14 0336 11/22/14 0514 11/23/14 0454 11/24/14 0430  WBC 5.4 6.1 7.3 8.3 7.3 7.6  NEUTROABS 4.1  --   --   --   --   --   HGB 14.2 12.7* 12.4* 13.4 13.2 12.3*  HCT 38.6* 34.5* 33.6* 38.3* 37.5* 35.9*  MCV 95.3 94.0 94.9 98.0 99.7 99.7  PLT 106* 104* 116* 136* 213 264   Cardiac Enzymes: No results for input(s): CKTOTAL, CKMB, CKMBINDEX, TROPONINI in the last 168 hours. BNP: BNP (last 3 results) No results for input(s): BNP in the last 8760 hours.  ProBNP (last 3 results) No results for input(s): PROBNP in the last 8760 hours.  CBG:  Recent Labs Lab 11/23/14 1216 11/23/14 1628 11/23/14 2211 11/24/14 0733 11/24/14 1222  GLUCAP 238* 318* 194* 235* 294*    Time coordinating discharge: 35 minutes  Signed:  Tia Gelb  [facility] 11/25/2014, 5:44 PM

## 2014-11-25 LAB — CULTURE, BLOOD (ROUTINE X 2)
CULTURE: NO GROWTH
Culture: NO GROWTH

## 2014-11-30 ENCOUNTER — Encounter: Payer: Self-pay | Admitting: Internal Medicine

## 2014-11-30 ENCOUNTER — Ambulatory Visit: Payer: MEDICAID | Attending: Internal Medicine | Admitting: Internal Medicine

## 2014-11-30 VITALS — BP 130/70 | HR 105 | Temp 98.0°F | Resp 16 | Wt 160.8 lb

## 2014-11-30 DIAGNOSIS — E139 Other specified diabetes mellitus without complications: Secondary | ICD-10-CM

## 2014-11-30 DIAGNOSIS — E876 Hypokalemia: Secondary | ICD-10-CM | POA: Insufficient documentation

## 2014-11-30 DIAGNOSIS — IMO0001 Reserved for inherently not codable concepts without codable children: Secondary | ICD-10-CM

## 2014-11-30 DIAGNOSIS — R030 Elevated blood-pressure reading, without diagnosis of hypertension: Secondary | ICD-10-CM | POA: Insufficient documentation

## 2014-11-30 DIAGNOSIS — Z79899 Other long term (current) drug therapy: Secondary | ICD-10-CM | POA: Insufficient documentation

## 2014-11-30 DIAGNOSIS — E1165 Type 2 diabetes mellitus with hyperglycemia: Secondary | ICD-10-CM | POA: Insufficient documentation

## 2014-11-30 DIAGNOSIS — Z794 Long term (current) use of insulin: Secondary | ICD-10-CM | POA: Insufficient documentation

## 2014-11-30 DIAGNOSIS — K2980 Duodenitis without bleeding: Secondary | ICD-10-CM | POA: Insufficient documentation

## 2014-11-30 LAB — COMPLETE METABOLIC PANEL WITH GFR
ALK PHOS: 106 U/L (ref 40–115)
ALT: 12 U/L (ref 9–46)
AST: 17 U/L (ref 10–40)
Albumin: 3.6 g/dL (ref 3.6–5.1)
BUN: 9 mg/dL (ref 7–25)
CALCIUM: 9.7 mg/dL (ref 8.6–10.3)
CHLORIDE: 92 mmol/L — AB (ref 98–110)
CO2: 31 mmol/L (ref 20–31)
Creat: 0.91 mg/dL (ref 0.60–1.35)
Glucose, Bld: 199 mg/dL — ABNORMAL HIGH (ref 65–99)
Potassium: 5 mmol/L (ref 3.5–5.3)
Sodium: 138 mmol/L (ref 135–146)
Total Bilirubin: 0.5 mg/dL (ref 0.2–1.2)
Total Protein: 6.6 g/dL (ref 6.1–8.1)

## 2014-11-30 LAB — GLUCOSE, POCT (MANUAL RESULT ENTRY): POC Glucose: 197 mg/dl — AB (ref 70–99)

## 2014-11-30 NOTE — Patient Instructions (Signed)
DASH Eating Plan DASH stands for "Dietary Approaches to Stop Hypertension." The DASH eating plan is a healthy eating plan that has been shown to reduce high blood pressure (hypertension). Additional health benefits may include reducing the risk of type 2 diabetes mellitus, heart disease, and stroke. The DASH eating plan may also help with weight loss. WHAT DO I NEED TO KNOW ABOUT THE DASH EATING PLAN? For the DASH eating plan, you will follow these general guidelines:  Choose foods with a percent daily value for sodium of less than 5% (as listed on the food label).  Use salt-free seasonings or herbs instead of table salt or sea salt.  Check with your health care provider or pharmacist before using salt substitutes.  Eat lower-sodium products, often labeled as "lower sodium" or "no salt added."  Eat fresh foods.  Eat more vegetables, fruits, and low-fat dairy products.  Choose whole grains. Look for the word "whole" as the first word in the ingredient list.  Choose fish and skinless chicken or turkey more often than red meat. Limit fish, poultry, and meat to 6 oz (170 g) each day.  Limit sweets, desserts, sugars, and sugary drinks.  Choose heart-healthy fats.  Limit cheese to 1 oz (28 g) per day.  Eat more home-cooked food and less restaurant, buffet, and fast food.  Limit fried foods.  Cook foods using methods other than frying.  Limit canned vegetables. If you do use them, rinse them well to decrease the sodium.  When eating at a restaurant, ask that your food be prepared with less salt, or no salt if possible. WHAT FOODS CAN I EAT? Seek help from a dietitian for individual calorie needs. Grains Whole grain or whole wheat bread. Brown rice. Whole grain or whole wheat pasta. Quinoa, bulgur, and whole grain cereals. Low-sodium cereals. Corn or whole wheat flour tortillas. Whole grain cornbread. Whole grain crackers. Low-sodium crackers. Vegetables Fresh or frozen vegetables  (raw, steamed, roasted, or grilled). Low-sodium or reduced-sodium tomato and vegetable juices. Low-sodium or reduced-sodium tomato sauce and paste. Low-sodium or reduced-sodium canned vegetables.  Fruits All fresh, canned (in natural juice), or frozen fruits. Meat and Other Protein Products Ground beef (85% or leaner), grass-fed beef, or beef trimmed of fat. Skinless chicken or turkey. Ground chicken or turkey. Pork trimmed of fat. All fish and seafood. Eggs. Dried beans, peas, or lentils. Unsalted nuts and seeds. Unsalted canned beans. Dairy Low-fat dairy products, such as skim or 1% milk, 2% or reduced-fat cheeses, low-fat ricotta or cottage cheese, or plain low-fat yogurt. Low-sodium or reduced-sodium cheeses. Fats and Oils Tub margarines without trans fats. Light or reduced-fat mayonnaise and salad dressings (reduced sodium). Avocado. Safflower, olive, or canola oils. Natural peanut or almond butter. Other Unsalted popcorn and pretzels. The items listed above may not be a complete list of recommended foods or beverages. Contact your dietitian for more options. WHAT FOODS ARE NOT RECOMMENDED? Grains White bread. White pasta. White rice. Refined cornbread. Bagels and croissants. Crackers that contain trans fat. Vegetables Creamed or fried vegetables. Vegetables in a cheese sauce. Regular canned vegetables. Regular canned tomato sauce and paste. Regular tomato and vegetable juices. Fruits Dried fruits. Canned fruit in light or heavy syrup. Fruit juice. Meat and Other Protein Products Fatty cuts of meat. Ribs, chicken wings, bacon, sausage, bologna, salami, chitterlings, fatback, hot dogs, bratwurst, and packaged luncheon meats. Salted nuts and seeds. Canned beans with salt. Dairy Whole or 2% milk, cream, half-and-half, and cream cheese. Whole-fat or sweetened yogurt. Full-fat   cheeses or blue cheese. Nondairy creamers and whipped toppings. Processed cheese, cheese spreads, or cheese  curds. Condiments Onion and garlic salt, seasoned salt, table salt, and sea salt. Canned and packaged gravies. Worcestershire sauce. Tartar sauce. Barbecue sauce. Teriyaki sauce. Soy sauce, including reduced sodium. Steak sauce. Fish sauce. Oyster sauce. Cocktail sauce. Horseradish. Ketchup and mustard. Meat flavorings and tenderizers. Bouillon cubes. Hot sauce. Tabasco sauce. Marinades. Taco seasonings. Relishes. Fats and Oils Butter, stick margarine, lard, shortening, ghee, and bacon fat. Coconut, palm kernel, or palm oils. Regular salad dressings. Other Pickles and olives. Salted popcorn and pretzels. The items listed above may not be a complete list of foods and beverages to avoid. Contact your dietitian for more information. WHERE CAN I FIND MORE INFORMATION? National Heart, Lung, and Blood Institute: www.nhlbi.nih.gov/health/health-topics/topics/dash/ Document Released: 04/03/2011 Document Revised: 08/29/2013 Document Reviewed: 02/16/2013 ExitCare Patient Information 2015 [employer]. This information is not intended to replace advice given to you by your health care provider. Make sure you discuss any questions you have with your health care provider. Diabetes Mellitus and Food It is important for you to manage your blood sugar (glucose) level. Your blood glucose level can be greatly affected by what you eat. Eating healthier foods in the appropriate amounts throughout the day at about the same time each day will help you control your blood glucose level. It can also help slow or prevent worsening of your diabetes mellitus. Healthy eating may even help you improve the level of your blood pressure and reach or maintain a healthy weight.  HOW CAN FOOD AFFECT ME? Carbohydrates Carbohydrates affect your blood glucose level more than any other type of food. Your dietitian will help you determine how many carbohydrates to eat at each meal and teach you how to count carbohydrates. Counting  carbohydrates is important to keep your blood glucose at a healthy level, especially if you are using insulin or taking certain medicines for diabetes mellitus. Alcohol Alcohol can cause sudden decreases in blood glucose (hypoglycemia), especially if you use insulin or take certain medicines for diabetes mellitus. Hypoglycemia can be a life-threatening condition. Symptoms of hypoglycemia (sleepiness, dizziness, and disorientation) are similar to symptoms of having too much alcohol.  If your health care provider has given you approval to drink alcohol, do so in moderation and use the following guidelines:  Women should not have more than one drink per day, and men should not have more than two drinks per day. One drink is equal to:  12 oz of beer.  5 oz of wine.  1 oz of hard liquor.  Do not drink on an empty stomach.  Keep yourself hydrated. Have water, diet soda, or unsweetened iced tea.  Regular soda, juice, and other mixers might contain a lot of carbohydrates and should be counted. WHAT FOODS ARE NOT RECOMMENDED? As you make food choices, it is important to remember that all foods are not the same. Some foods have fewer nutrients per serving than other foods, even though they might have the same number of calories or carbohydrates. It is difficult to get your body what it needs when you eat foods with fewer nutrients. Examples of foods that you should avoid that are high in calories and carbohydrates but low in nutrients include:  Trans fats (most processed foods list trans fats on the Nutrition Facts label).  Regular soda.  Juice.  Candy.  Sweets, such as cake, pie, doughnuts, and cookies.  Fried foods. WHAT FOODS CAN I EAT? Have nutrient-rich foods,   which will nourish your body and keep you healthy. The food you should eat also will depend on several factors, including:  The calories you need.  The medicines you take.  Your weight.  Your blood glucose level.  Your  blood pressure level.  Your cholesterol level. You also should eat a variety of foods, including:  Protein, such as meat, poultry, fish, tofu, nuts, and seeds (lean animal proteins are best).  Fruits.  Vegetables.  Dairy products, such as milk, cheese, and yogurt (low fat is best).  Breads, grains, pasta, cereal, rice, and beans.  Fats such as olive oil, trans fat-free margarine, canola oil, avocado, and olives. DOES EVERYONE WITH DIABETES MELLITUS HAVE THE SAME MEAL PLAN? Because every person with diabetes mellitus is different, there is not one meal plan that works for everyone. It is very important that you meet with a dietitian who will help you create a meal plan that is just right for you. Document Released: 01/09/2005 Document Revised: 04/19/2013 Document Reviewed: 03/11/2013 ExitCare Patient Information 2015 [employer]. This information is not intended to replace advice given to you by your health care provider. Make sure you discuss any questions you have with your health care provider.  

## 2014-11-30 NOTE — Progress Notes (Signed)
Patient here for follow up from the hospital Patient was admitted with his sugars out of "whack" Patient was not taking his medications as prescribed

## 2014-11-30 NOTE — Progress Notes (Signed)
MRN: [id number] Name: Christopher Robles  Sex: male Age: 24 y.o. DOB: 1990-09-20  Allergies: Review of patient's allergies indicates no known allergies.  Chief Complaint  Patient presents with  . Follow-up    HPI: Patient is 24 y.o. male who  has history of diabetes recently hospitalized with delirium, found to have DKA, treated with IV insulin, IV hydration, transitioned to 70/30 insulin at the time of discharge his dose was increased to 28 units twice a day his last hemoglobin A1c was 10.2%, he also had a persistent fever during hospitalization, ID was on board, blood cultures were negative, she was treated with antibiotics and subsequently improved her he had CT abdomen chest done which reported the tinnitus was started on PPI and advised to follow with GI, he was also treated for oral thrush with Diflucan. Patient currently denies any headache dizziness chest and shortness of breath, denies any nausea vomiting denies any more reflux symptoms, as per patient still his fasting blood sugar is more than 150 mg/dl, also initially his blood pressure was elevated, repeat manual blood pressure is 130/70, patient denies smoking cigarettes  Past Medical History  Diagnosis Date  . Anxiety   . Seizure   . DM I (diabetes mellitus, type I), uncontrolled   . ETOH abuse     Past Surgical History  Procedure Laterality Date  . Appendectomy        Medication List       This list is accurate as of: 11/30/14  2:45 PM.  Always use your most recent med list.               acetaminophen 500 MG tablet  Commonly known as:  TYLENOL  Take 1,000-2,000 mg by mouth every 6 (six) hours as needed (for pain.).     blood glucose meter kit and supplies  Dispense based on patient and insurance preference. Use up to four times daily as directed. (FOR ICD-9 250.00, 250.01).     fluconazole 200 MG tablet  Commonly known as:  DIFLUCAN  Take 1 tablet (200 mg total) by mouth daily.     insulin NPH-regular  Human (70-30) 100 UNIT/ML injection  Commonly known as:  NOVOLIN 70/30 RELION  Inject 28 Units into the skin 2 (two) times daily with a meal.     pantoprazole 40 MG tablet  Commonly known as:  PROTONIX  Take 1 tablet (40 mg total) by mouth 2 (two) times daily.        No orders of the defined types were placed in this encounter.     There is no immunization history on file for this patient.  Family History  Problem Relation Age of Onset  . Cancer Maternal Aunt   . Cancer Maternal Uncle   . Heart disease Paternal Grandfather     History  Substance Use Topics  . Smoking status: Never Smoker   . Smokeless tobacco: Not on file  . Alcohol Use: 0.0 oz/week    0 Standard drinks or equivalent per week    Review of Systems   As noted in HPI  Filed Vitals:   11/30/14 1428  BP: 130/70  Pulse:   Temp:   Resp:     Physical Exam  Physical Exam  Constitutional: No distress.  Eyes: EOM are normal. Pupils are equal, round, and reactive to light.  Cardiovascular: Normal rate and regular rhythm.   Pulmonary/Chest: Breath sounds normal. No respiratory distress. He has no wheezes. He has no  rales.  Abdominal: Soft. He exhibits no distension. There is no tenderness. There is no rebound.  Musculoskeletal: He exhibits no edema.    Labs   Lab Results  Component Value Date   WBC 7.6 11/24/2014   HGB 12.3* 11/24/2014   HCT 35.9* 11/24/2014   PLT 264 11/24/2014   GLUCOSE 244* 11/24/2014   CHOL 158 08/09/2007   TRIG 146 08/09/2007   HDL 36 08/09/2007   LDLCALC 93 08/09/2007   ALT 14 12/19/2013   AST 21 12/19/2013   NA 133* 11/24/2014   K 3.5 11/24/2014   CL 99* 11/24/2014   CREATININE 1.12 11/24/2014   BUN 17 11/24/2014   CO2 22 11/24/2014   TSH 2.400 12/19/2013   HGBA1C 10.2* 11/18/2014    Lab Results  Component Value Date   HGBA1C 10.2* 11/18/2014   HGBA1C 13.9 12/19/2013     Assessment and Plan  Other specified diabetes mellitus without complications -  Plan:  Results for orders placed or performed in visit on 11/30/14  Glucose (CBG)  Result Value Ref Range   POC Glucose 197 (A) 70 - 99 mg/dl   Diabetes is still uncontrolled, recent hemoglobin A1c was 10.2%, I have advised patient to increase the dose of insulin to 30 units twice a day if his fasting blood sugar is persistently more than 1 50 mg/dL, advise patient for diabetes meal planning, repeat A1c in 3 months  Duodenitis Continue with PPI  Elevated BP Advised patient for DASH diet  Hypokalemia - Plan:  Repeat blood chemistry COMPLETE METABOLIC PANEL WITH GFR    Return in about 3 months (around 03/02/2015), or if symptoms worsen or fail to improve, for diabetes.   This note has been created with Surveyor, quantity. Any transcriptional errors are unintentional.    Lorayne Marek, MD

## 2014-12-01 ENCOUNTER — Telehealth: Payer: Self-pay

## 2014-12-01 NOTE — Telephone Encounter (Signed)
Patient is aware of his lab results 

## 2014-12-01 NOTE — Telephone Encounter (Signed)
-----   Message from Doris Cheadle, MD sent at 12/01/2014 10:24 AM EDT ----- Call and let the  patient know that his potassium level is in normal range, has elevated blood sugar levels, needs better control with diabetes, follow the recommendations as discussed during the office visit.

## 2015-01-02 ENCOUNTER — Other Ambulatory Visit: Payer: Self-pay | Admitting: Internal Medicine

## 2015-02-12 ENCOUNTER — Inpatient Hospital Stay (HOSPITAL_COMMUNITY)
Admission: EM | Admit: 2015-02-12 | Discharge: 2015-02-14 | DRG: 638 | Disposition: A | Discharge status: Home / Self Care | Payer: Self-pay | Attending: Internal Medicine | Admitting: Internal Medicine

## 2015-02-12 ENCOUNTER — Encounter (HOSPITAL_COMMUNITY): Payer: Self-pay | Admitting: Nurse Practitioner

## 2015-02-12 DIAGNOSIS — N179 Acute kidney failure, unspecified: Secondary | ICD-10-CM | POA: Diagnosis present

## 2015-02-12 DIAGNOSIS — E871 Hypo-osmolality and hyponatremia: Secondary | ICD-10-CM | POA: Diagnosis present

## 2015-02-12 DIAGNOSIS — Z8249 Family history of ischemic heart disease and other diseases of the circulatory system: Secondary | ICD-10-CM

## 2015-02-12 DIAGNOSIS — E1310 Other specified diabetes mellitus with ketoacidosis without coma: Secondary | ICD-10-CM

## 2015-02-12 DIAGNOSIS — R21 Rash and other nonspecific skin eruption: Secondary | ICD-10-CM | POA: Diagnosis present

## 2015-02-12 DIAGNOSIS — E1010 Type 1 diabetes mellitus with ketoacidosis without coma: Principal | ICD-10-CM | POA: Diagnosis present

## 2015-02-12 DIAGNOSIS — E872 Acidosis: Secondary | ICD-10-CM | POA: Diagnosis present

## 2015-02-12 DIAGNOSIS — R112 Nausea with vomiting, unspecified: Secondary | ICD-10-CM | POA: Diagnosis present

## 2015-02-12 DIAGNOSIS — E0810 Diabetes mellitus due to underlying condition with ketoacidosis without coma: Secondary | ICD-10-CM

## 2015-02-12 DIAGNOSIS — E8729 Other acidosis: Secondary | ICD-10-CM | POA: Diagnosis present

## 2015-02-12 DIAGNOSIS — E1065 Type 1 diabetes mellitus with hyperglycemia: Secondary | ICD-10-CM | POA: Diagnosis present

## 2015-02-12 DIAGNOSIS — F419 Anxiety disorder, unspecified: Secondary | ICD-10-CM | POA: Diagnosis present

## 2015-02-12 DIAGNOSIS — Z794 Long term (current) use of insulin: Secondary | ICD-10-CM

## 2015-02-12 DIAGNOSIS — Z809 Family history of malignant neoplasm, unspecified: Secondary | ICD-10-CM

## 2015-02-12 DIAGNOSIS — Z9114 Patient's other noncompliance with medication regimen: Secondary | ICD-10-CM

## 2015-02-12 DIAGNOSIS — Z79899 Other long term (current) drug therapy: Secondary | ICD-10-CM

## 2015-02-12 DIAGNOSIS — D72829 Elevated white blood cell count, unspecified: Secondary | ICD-10-CM | POA: Diagnosis present

## 2015-02-12 DIAGNOSIS — K1230 Oral mucositis (ulcerative), unspecified: Secondary | ICD-10-CM | POA: Diagnosis present

## 2015-02-12 DIAGNOSIS — Z91199 Patient's noncompliance with other medical treatment and regimen due to unspecified reason: Secondary | ICD-10-CM

## 2015-02-12 DIAGNOSIS — Z9119 Patient's noncompliance with other medical treatment and regimen: Secondary | ICD-10-CM

## 2015-02-12 DIAGNOSIS — E876 Hypokalemia: Secondary | ICD-10-CM | POA: Diagnosis present

## 2015-02-12 DIAGNOSIS — E875 Hyperkalemia: Secondary | ICD-10-CM | POA: Diagnosis present

## 2015-02-12 DIAGNOSIS — E860 Dehydration: Secondary | ICD-10-CM | POA: Diagnosis present

## 2015-02-12 DIAGNOSIS — E1110 Type 2 diabetes mellitus with ketoacidosis without coma: Secondary | ICD-10-CM | POA: Diagnosis present

## 2015-02-12 LAB — BASIC METABOLIC PANEL
ANION GAP: 8 (ref 5–15)
Anion gap: 31 — ABNORMAL HIGH (ref 5–15)
Anion gap: 24 — ABNORMAL HIGH (ref 5–15)
Anion gap: 16 — ABNORMAL HIGH (ref 5–15)
BUN: 16 mg/dL (ref 6–20)
BUN: 15 mg/dL (ref 6–20)
BUN: 14 mg/dL (ref 6–20)
BUN: 10 mg/dL (ref 6–20)
CHLORIDE: 101 mmol/L (ref 101–111)
CO2: 6 mmol/L — AB (ref 22–32)
CO2: 8 mmol/L — AB (ref 22–32)
CO2: 12 mmol/L — AB (ref 22–32)
CO2: 18 mmol/L — AB (ref 22–32)
CREATININE: 1.45 mg/dL — AB (ref 0.61–1.24)
Calcium: 9.6 mg/dL (ref 8.9–10.3)
Calcium: 9.3 mg/dL (ref 8.9–10.3)
Calcium: 8.8 mg/dL — ABNORMAL LOW (ref 8.9–10.3)
Calcium: 8.5 mg/dL — ABNORMAL LOW (ref 8.9–10.3)
Chloride: 93 mmol/L — ABNORMAL LOW (ref 101–111)
Chloride: 102 mmol/L (ref 101–111)
Chloride: 102 mmol/L (ref 101–111)
Creatinine, Ser: 1.22 mg/dL (ref 0.61–1.24)
Creatinine, Ser: 1.03 mg/dL (ref 0.61–1.24)
Creatinine, Ser: 0.86 mg/dL (ref 0.61–1.24)
GFR calc Af Amer: >60 mL/min (ref >60)
GFR calc Af Amer: >60 mL/min (ref >60)
GFR calc Af Amer: >60 mL/min (ref >60)
GFR calc Af Amer: >60 mL/min (ref >60)
GFR calc non Af Amer: >60 mL/min (ref >60)
GFR calc non Af Amer: >60 mL/min (ref >60)
GLUCOSE: 146 mg/dL — AB (ref 65–99)
GLUCOSE: 149 mg/dL — AB (ref 65–99)
GLUCOSE: 222 mg/dL — AB (ref 65–99)
Glucose, Bld: 359 mg/dL — ABNORMAL HIGH (ref 65–99)
POTASSIUM: 4.9 mmol/L (ref 3.5–5.1)
POTASSIUM: 4.2 mmol/L (ref 3.5–5.1)
POTASSIUM: 4 mmol/L (ref 3.5–5.1)
Potassium: 5.8 mmol/L — ABNORMAL HIGH (ref 3.5–5.1)
Sodium: 130 mmol/L — ABNORMAL LOW (ref 135–145)
Sodium: 134 mmol/L — ABNORMAL LOW (ref 135–145)
Sodium: 130 mmol/L — ABNORMAL LOW (ref 135–145)
Sodium: 127 mmol/L — ABNORMAL LOW (ref 135–145)

## 2015-02-12 LAB — GLUCOSE, CAPILLARY
GLUCOSE-CAPILLARY: 132 mg/dL — AB (ref 65–99)
GLUCOSE-CAPILLARY: 182 mg/dL — AB (ref 65–99)
GLUCOSE-CAPILLARY: 225 mg/dL — AB (ref 65–99)
GLUCOSE-CAPILLARY: 99 mg/dL (ref 65–99)
GLUCOSE-CAPILLARY: 145 mg/dL — AB (ref 65–99)
GLUCOSE-CAPILLARY: 131 mg/dL — AB (ref 65–99)
GLUCOSE-CAPILLARY: 175 mg/dL — AB (ref 65–99)
GLUCOSE-CAPILLARY: 212 mg/dL — AB (ref 65–99)
GLUCOSE-CAPILLARY: 237 mg/dL — AB (ref 65–99)
GLUCOSE-CAPILLARY: 117 mg/dL — AB (ref 65–99)
Glucose-Capillary: 128 mg/dL — ABNORMAL HIGH (ref 65–99)
Glucose-Capillary: 227 mg/dL — ABNORMAL HIGH (ref 65–99)
Glucose-Capillary: 167 mg/dL — ABNORMAL HIGH (ref 65–99)
Glucose-Capillary: 183 mg/dL — ABNORMAL HIGH (ref 65–99)
Glucose-Capillary: 160 mg/dL — ABNORMAL HIGH (ref 65–99)
Glucose-Capillary: 99 mg/dL (ref 65–99)
Glucose-Capillary: 120 mg/dL — ABNORMAL HIGH (ref 65–99)
Glucose-Capillary: 148 mg/dL — ABNORMAL HIGH (ref 65–99)

## 2015-02-12 LAB — BASIC METABOLIC PANEL WITH GFR
Anion gap: 11 (ref 5–15)
BUN: 11 mg/dL (ref 6–20)
CO2: 18 mmol/L — ABNORMAL LOW (ref 22–32)
Calcium: 8.8 mg/dL — ABNORMAL LOW (ref 8.9–10.3)
Chloride: 103 mmol/L (ref 101–111)
Creatinine, Ser: 0.84 mg/dL (ref 0.61–1.24)
GFR calc Af Amer: >60 mL/min
GFR calc non Af Amer: >60 mL/min
Glucose, Bld: 135 mg/dL — ABNORMAL HIGH (ref 65–99)
Potassium: 4 mmol/L (ref 3.5–5.1)
Sodium: 132 mmol/L — ABNORMAL LOW (ref 135–145)

## 2015-02-12 LAB — URINALYSIS, ROUTINE W REFLEX MICROSCOPIC
Bilirubin Urine: NEGATIVE
Ketones, ur: >80 mg/dL — AB
Leukocytes, UA: NEGATIVE
Nitrite: NEGATIVE
Protein, ur: 100 mg/dL — AB
Specific Gravity, Urine: 1.026 (ref 1.005–1.030)
Urobilinogen, UA: 0.2 mg/dL (ref 0.0–1.0)
pH: 5 (ref 5.0–8.0)

## 2015-02-12 LAB — CBC
HEMATOCRIT: 45.4 % (ref 39.0–52.0)
HEMOGLOBIN: 15.9 g/dL (ref 13.0–17.0)
MCH: 34.6 pg — ABNORMAL HIGH (ref 26.0–34.0)
MCHC: 35 g/dL (ref 30.0–36.0)
MCV: 98.9 fL (ref 78.0–100.0)
Platelets: 219 10*3/uL (ref 150–400)
RBC: 4.59 MIL/uL (ref 4.22–5.81)
RDW: 12.2 % (ref 11.5–15.5)
WBC: 18.5 10*3/uL — AB (ref 4.0–10.5)

## 2015-02-12 LAB — BLOOD GAS, VENOUS
Acid-base deficit: 24.7 mmol/L — ABNORMAL HIGH (ref 0.0–2.0)
Bicarbonate: 5.8 mEq/L — ABNORMAL LOW (ref 20.0–24.0)
FIO2: 0.21
O2 SAT: 59.7 %
PCO2 VEN: 21.1 mmHg — AB (ref 45.0–50.0)
PO2 VEN: 36.4 mmHg (ref 30.0–45.0)
Patient temperature: 98.1
TCO2: 5.6 mmol/L (ref 0–100)
pH, Ven: 7.07 — CL (ref 7.250–7.300)

## 2015-02-12 LAB — CBC WITH DIFFERENTIAL/PLATELET
Basophils Absolute: 0 10*3/uL (ref 0.0–0.1)
Basophils Relative: 0 %
EOS ABS: 0 10*3/uL (ref 0.0–0.7)
Eosinophils Relative: 0 %
HCT: 49.4 % (ref 39.0–52.0)
Hemoglobin: 17.3 g/dL — ABNORMAL HIGH (ref 13.0–17.0)
LYMPHS ABS: 1.4 10*3/uL (ref 0.7–4.0)
LYMPHS PCT: 8 %
MCH: 35 pg — ABNORMAL HIGH (ref 26.0–34.0)
MCHC: 35 g/dL (ref 30.0–36.0)
MCV: 100 fL (ref 78.0–100.0)
MONOS PCT: 6 %
Monocytes Absolute: 1 10*3/uL (ref 0.1–1.0)
NEUTROS ABS: 14.5 10*3/uL — AB (ref 1.7–7.7)
Neutrophils Relative %: 86 %
Platelets: 219 10*3/uL (ref 150–400)
RBC: 4.94 MIL/uL (ref 4.22–5.81)
RDW: 12.1 % (ref 11.5–15.5)
WBC: 16.9 10*3/uL — ABNORMAL HIGH (ref 4.0–10.5)

## 2015-02-12 LAB — ETHANOL: Alcohol, Ethyl (B): <5 mg/dL (ref <5)

## 2015-02-12 LAB — RAPID URINE DRUG SCREEN, HOSP PERFORMED
Amphetamines: NOT DETECTED
BARBITURATES: NOT DETECTED
BENZODIAZEPINES: NOT DETECTED
Cocaine: NOT DETECTED
Opiates: NOT DETECTED
Tetrahydrocannabinol: NOT DETECTED

## 2015-02-12 LAB — CBG MONITORING, ED
Glucose-Capillary: 369 mg/dL — ABNORMAL HIGH (ref 65–99)
Glucose-Capillary: 341 mg/dL — ABNORMAL HIGH (ref 65–99)
Glucose-Capillary: 210 mg/dL — ABNORMAL HIGH (ref 65–99)

## 2015-02-12 LAB — BETA-HYDROXYBUTYRIC ACID

## 2015-02-12 LAB — PROCALCITONIN: PROCALCITONIN: 0.32 ng/mL

## 2015-02-12 LAB — LACTIC ACID, PLASMA: LACTIC ACID, VENOUS: 1.1 mmol/L (ref 0.5–2.0)

## 2015-02-12 LAB — MAGNESIUM: Magnesium: 2.3 mg/dL (ref 1.7–2.4)

## 2015-02-12 LAB — URINE MICROSCOPIC-ADD ON

## 2015-02-12 LAB — PHOSPHORUS: Phosphorus: 5.5 mg/dL — ABNORMAL HIGH (ref 2.5–4.6)

## 2015-02-12 LAB — MRSA PCR SCREENING: MRSA by PCR: NEGATIVE

## 2015-02-12 LAB — LIPASE, BLOOD: Lipase: 15 U/L — ABNORMAL LOW (ref 22–51)

## 2015-02-12 MED ORDER — ONDANSETRON HCL 4 MG/2ML IJ SOLN: 4.0000 mg | Freq: Four times a day (QID) | INTRAMUSCULAR | Status: DC | PRN

## 2015-02-12 MED ORDER — FAMOTIDINE IN NACL 20-0.9 MG/50ML-% IV SOLN
20.0000 mg | Freq: Two times a day (BID) | INTRAVENOUS | Status: DC
  Filled 2015-02-12: qty 50

## 2015-02-12 MED ORDER — DIPHENHYDRAMINE HCL 25 MG PO CAPS
25.0000 mg | ORAL_CAPSULE | Freq: Four times a day (QID) | ORAL | Status: DC | PRN
  Administered 2015-02-13: 25 mg via ORAL

## 2015-02-12 MED ORDER — SODIUM CHLORIDE 0.9 % IV SOLN
INTRAVENOUS | Status: DC
  Administered 2015-02-12: 3.1 [IU]/h via INTRAVENOUS
  Filled 2015-02-12: qty 2.5

## 2015-02-12 MED ORDER — DEXTROSE-NACL 5-0.45 % IV SOLN
INTRAVENOUS | Status: DC
  Administered 2015-02-12 – 2015-02-13 (×4): via INTRAVENOUS

## 2015-02-12 MED ORDER — MAGIC MOUTHWASH W/LIDOCAINE
15.0000 mL | Freq: Four times a day (QID) | ORAL | Status: DC
  Administered 2015-02-12 – 2015-02-14 (×9): 15 mL via ORAL
  Filled 2015-02-12 (×14): qty 15

## 2015-02-12 MED ORDER — ACETAMINOPHEN 325 MG PO TABS
650.0000 mg | ORAL_TABLET | Freq: Four times a day (QID) | ORAL | Status: DC | PRN
  Administered 2015-02-12: 650 mg via ORAL
  Filled 2015-02-12: qty 2

## 2015-02-12 MED ORDER — HEPARIN SODIUM (PORCINE) 5000 UNIT/ML IJ SOLN
5000.0000 [IU] | Freq: Three times a day (TID) | INTRAMUSCULAR | Status: DC
  Administered 2015-02-12 – 2015-02-14 (×6): 5000 [IU] via SUBCUTANEOUS
  Filled 2015-02-12 (×7): qty 1

## 2015-02-12 MED ORDER — MENTHOL 3 MG MT LOZG
1.0000 | LOZENGE | OROMUCOSAL | Status: DC | PRN
  Administered 2015-02-12 – 2015-02-14 (×5): 3 mg via ORAL
  Filled 2015-02-12 (×6): qty 9

## 2015-02-12 MED ORDER — ONDANSETRON HCL 4 MG/2ML IJ SOLN
4.0000 mg | Freq: Once | INTRAMUSCULAR | Status: AC
  Administered 2015-02-12: 4 mg via INTRAVENOUS
  Filled 2015-02-12: qty 2

## 2015-02-12 MED ORDER — FAMOTIDINE 20 MG PO TABS
20.0000 mg | ORAL_TABLET | Freq: Two times a day (BID) | ORAL | Status: DC
Start: 2015-02-12 — End: 2015-02-14
  Administered 2015-02-12 – 2015-02-14 (×5): 20 mg via ORAL
  Filled 2015-02-12 (×5): qty 1

## 2015-02-12 MED ORDER — SODIUM CHLORIDE 0.9 % IV SOLN: 1000.0000 mL | INTRAVENOUS | Status: DC

## 2015-02-12 MED ORDER — SODIUM CHLORIDE 0.9 % IV SOLN
INTRAVENOUS | Status: DC
  Filled 2015-02-12: qty 2.5

## 2015-02-12 MED ORDER — SODIUM CHLORIDE 0.9 % IV SOLN
Freq: Once | INTRAVENOUS | Status: AC
  Administered 2015-02-12: 04:00:00 via INTRAVENOUS

## 2015-02-12 MED ORDER — LIP MEDEX EX OINT
TOPICAL_OINTMENT | CUTANEOUS | Status: AC
  Administered 2015-02-12: 1
  Filled 2015-02-12: qty 7

## 2015-02-12 MED ORDER — SODIUM CHLORIDE 0.9 % IV SOLN: INTRAVENOUS | Status: DC

## 2015-02-12 MED ORDER — SODIUM CHLORIDE 0.9 % IV SOLN
Freq: Once | INTRAVENOUS | Status: AC
  Administered 2015-02-12: 03:00:00 via INTRAVENOUS

## 2015-02-12 MED ORDER — SODIUM CHLORIDE 0.9 % IV SOLN: 1000.0000 mL | Freq: Once | INTRAVENOUS | Status: AC

## 2015-02-12 NOTE — Care Management Note (Signed)
Case Management Note  Patient Details  Name: Christopher Robles MRN: [id number] Date of Birth: 1991-01-22  Subjective/Objective:                 dka with etoh abuse hx   Action/Plan:Date: February 12, 2015 Chart reviewed for concurrent status and case management needs. Will continue to follow patient for changes and needs: Marcelle Smilinghonda Davis, RN, BSN, [place]   [phone]   Expected Discharge Date:                  Expected Discharge Plan:  Home/Self Care  In-House Referral:  NA  Discharge planning Services  CM Consult  Post Acute Care Choice:  NA Choice offered to:  NA  DME Arranged:  N/A DME Agency:  NA  HH Arranged:  NA HH Agency:  NA  Status of Service:  In process, will continue to follow  Medicare Important Message Given:    Date Medicare IM Given:    Medicare IM give by:    Date Additional Medicare IM Given:    Additional Medicare Important Message give by:     If discussed at Long Length of Stay Meetings, dates discussed:    Additional Comments:  Golda AcreDavis, Rhonda Lynn, RN 02/12/2015, 10:11 AM

## 2015-02-12 NOTE — ED Notes (Signed)
Report given to Angie on CCU.

## 2015-02-12 NOTE — ED Notes (Signed)
MD at bedside. 

## 2015-02-12 NOTE — Progress Notes (Signed)
Inpatient Diabetes Program Recommendations  AACE/ADA: New Consensus Statement on Inpatient Glycemic Control (2015)  Target Ranges:  Prepandial:   less than 140 mg/dL      Peak postprandial:   less than 180 mg/dL (1-2 hours)      Critically ill patients:  140 - 180 mg/dL   Results for Christopher Robles, Christopher Robles (MRN [id number]) as of 02/12/2015 11:39  Ref. Range 02/12/2015 02:10  Sodium Latest Ref Range: 135-145 mmol/L 130 (L)  Potassium Latest Ref Range: 3.5-5.1 mmol/L 5.8 (H)  Chloride Latest Ref Range: 101-111 mmol/L 93 (L)  CO2 Latest Ref Range: 22-32 mmol/L 6 (L)  BUN Latest Ref Range: 6-20 mg/dL 16  Creatinine Latest Ref Range: 0.61-1.24 mg/dL 1.45 (H)  Calcium Latest Ref Range: 8.9-10.3 mg/dL 9.6  EGFR (Non-African Amer.) Latest Ref Range: >60 mL/min >60  EGFR (African American) Latest Ref Range: >60 mL/min >60  Glucose Latest Ref Range: 65-99 mg/dL 359 (H)  Anion gap Latest Ref Range: 5-15  31 (H)    Admit with: DKA  History: Diabetes, ETOH Abuse  Home DM Meds: Reli-On 70/30 Insulin- 28 units bidwc  Current Insulin Orders: IV Insulin drip per DKA Orders     -Note glucose 359 mg/dl, CO2 6, Anion Gap 31 on admission.  Received IVF in the ED and started on IV Insulin drip at 2:45 am this morning per GlucoStabilizer.  -BMET from 0750am shows patient still acidotic- CO2 8 and Anion Gap 24.   -This DM Coordinator spoke extensively with patient about his DM care at home during his last hospitalization in July (see DM Coordinator note from 11/21/14 for details of our conversation).  Counseled patient about DKA, about his DM care at home, and encouraged patient to buy a CBG meter OTC at [facility] and check his CBGs more frequently at home.  During that conversation, patient stated he often runs out of insulin b/c he cannot afford it.  During his July hospitalization, patient was switched to Reli-On 70/30 insulin so that he could purchase it for $25 per vial at [facility].  Patient followed up with  Dr. Lorayne Marek at the [facility] on 11/30/14.  Patient is supposed to return for Diabetes follow-up at the [facility] clinic around 03/02/15 per Dr. Reine Just notes.  -Note current A1c is pending.  Expect results to be elevated since patient currently admits to running out of insulin several weeks ago.     MD- When patient's acidosis has cleared and he is finally ready to transition off the IV Insulin drip, please make sure to start his 70/30 insulin at least 1 hour prior to insulin drip being stopped.    --Will follow patient during hospitalization--  Wyn Quaker RN, MSN, CDE Diabetes Coordinator Inpatient Glycemic Control Team Team Pager: [phone] (8a-5p)

## 2015-02-12 NOTE — Progress Notes (Addendum)
Patient ID: Christopher Robles, male   DOB: February 13, 1991, 24 y.o.   MRN: [id number]   [facility] PROGRESS NOTE  Christopher Robles [id number] DOB: February 13, 1991 DOA: 02/12/2015 PCP: Christopher Robles   Brief narrative:    24 y.o. male with diabetes mellitus, history of alcohol abuse, [facility] with main concern of 1-2 days duration of progressively worsening epigastric pain, intermittent and throbbing, 7/10 in severity when present, occasionally but not consistently radiating to the lower abd quadrants and back area, associated with nausea and 15-17 episodes of non bloody diarrhea. Pt denied any specific alleviating factors, no similar events in the past.   In ED, blood work was consistent with severe DKA and pt was admitted for further evaluation.   Assessment/Plan:    Principal Problem:   DKA (diabetic ketoacidoses) (HCC) in pt with DM type I and known medical non compliance  - improving but with persistently elevated AG, 24 this AM - keep on insulin drip, IVF - BMP Q4 hours until gap closes - A1C has been requested as pt has known history of medical non compliance  - keep in SDU for now  Active Problems:   AKI (acute kidney injury) (HCC) - secondary to pre renal etiology, DKA - IVF provided, Cr is now WNL - continue to monitor with BMP    Nausea and vomiting - secondary to DKA - not clear if there is underlying pancreatitis as pt has known history of alcohol use - will check lipase level just in case to rule this AM - provide antiemetics as needed  - keep on CIWA protocol     Facial rash - unclear etiology - pt says it is better this AM - continue to provide benadryl as needed     Hyperkalemia - secondary to DKA - resolved  - monitor with BMP's    Hyponatremia - from DKA - Na up from 130 --> 134 - monitor with BMP    Leukocytosis - pt with elevated HR and certainly worrisome for uncerdylint sepsis but pt with no fever and no specific concern that would  suggest infectoius cuase, UA is clear, no cxr available but not needed for now as lungs are clear on exam - will repeat CBC now, will also check lactic acid and procalcitonin level - blood cultures have been obtained on admission and will need to follow up on results  - it appears that elevated WBC, HR and RR is mostly consistent with DKA - will repeat CBC in AM    Mucositis - continue with mouthwash   DVT prophylaxis - Heparin SQ  Code Status: Full.  Family Communication:  plan of care discussed with the patient Disposition Plan: Home when stable. Keep in SDU as pt still requiring insulin drip   IV access:  Peripheral IV  Procedures and diagnostic studies:    No results found.  Medical Consultants:  None  Other Consultants:  None  IAnti-Infectives:   None  Debbora PrestoMAGICK-Manal Kreutzer, Robles  [facility] Pager [phone]  If 7PM-7AM, please contact night-coverage [email or web address] Password [email or web address] 02/12/2015, 9:15 AM   LOS: 0 days   HPI/Subjective: No events overnight. Still with nausea but no vomiting.   Objective: Filed Vitals:   02/12/15 0215 02/12/15 0600 02/12/15 0800 02/12/15 0900  BP: 153/89 131/80 147/70   Pulse: 136 129 115 95  Temp: 98.1 F (36.7 C)  98.5 F (36.9 C)   TempSrc: Oral  Oral   Resp: 18 20 27 22   SpO2:  100% 100% 100% 100%    Intake/Output Summary (Last 24 hours) at 02/12/15 0915 Last data filed at 02/12/15 0900  Gross per 24 hour  Intake 899.98 ml  Output    700 ml  Net 199.98 ml    Exam:   General:  Pt is alert, follows commands appropriately, not in acute distress, macular rash in facial area  Cardiovascular: Regular rhythm, tachycardic, S1/S2, no murmurs, no rubs, no gallops  Respiratory: Clear to auscultation bilaterally, no wheezing, no crackles, no rhonchi  Abdomen: Soft, non tender, non distended, bowel sounds present, no guarding   Data Reviewed: Basic Metabolic Panel:  Recent Labs Lab 02/12/15 0210 02/12/15 0750  NA 130* 134*  K  5.8* 4.9  CL 93* 102  CO2 6* 8*  GLUCOSE 359* 146*  BUN 16 15  CREATININE 1.45* 1.22  CALCIUM 9.6 9.3  MG 2.3  --   PHOS 5.5*  --    CBC:  Recent Labs Lab 02/12/15 0210  WBC 16.9*  NEUTROABS 14.5*  HGB 17.3*  HCT 49.4  MCV 100.0  PLT 219   CBG:  Recent Labs Lab 02/12/15 0208 02/12/15 0349 02/12/15 0500 02/12/15 0606 02/12/15 0726  GLUCAP 369* 341* 210* 132* 128*    Recent Results (from the past 240 hour(s))  MRSA PCR Screening     Status: None   Collection Time: 02/12/15  5:56 AM  Result Value Ref Range Status   MRSA by PCR NEGATIVE NEGATIVE Final     Scheduled Meds: . famotidine (PEPCID) IV  20 mg Intravenous Q12H  . heparin  5,000 Units Subcutaneous 3 times per day  . magic mouthwash w/lidocaine  15 mL Oral QID   Continuous Infusions: . sodium chloride 125 mL/hr at 02/12/15 0800  . dextrose 5 % and 0.45% NaCl 125 mL/hr at 02/12/15 0800  . insulin (NOVOLIN-R) infusion 1.2 Units/hr (02/12/15 0847)

## 2015-02-12 NOTE — H&P (Signed)
[facility] History and Physical  Patient: Christopher Robles  MRN: [id number]  DOB: [birth year]  DOS: the patient was seen and examined on 02/12/2015 PCP: Lorayne Marek, MD  Referring physician: Dr. Randal Buba Chief Complaint: Nausea and vomiting  HPI: NATHIAN Robles is a 24 y.o. male with Past medical history of diabetes mellitus, history of alcohol abuse. Patient mentions that he started having complaints of nausea and vomiting today and he might have 13-14 episodes of vomiting today. He started having burning for pain as well as the vomiting episodes. He denies having any fever but did have some chills. He started having numbness of abdominal pain which was sharp and crampy and diffuse. He had one solid bowel movement followed by one loose bowel movement today. Denies any active bleeding. Next and no burning urination. He mentions that he ran out of his insulin roughly a few weeks ago. He mentions he drinks occasional alcohol and smoke occasional cigarettes and occasionally uses marijuana. Mentions that he has facial redness for last 6 months which comes and goes and improves on its own. It is this might have been associated with him working in [employer].  The patient is coming from home.  At his baseline ambulates without support And is independent for most of his ADL; manages his medication on his own.  Review of Systems: as mentioned in the history of present illness.  A comprehensive review of the other systems is negative.  Past Medical History  Diagnosis Date  . Anxiety   . Seizure ([place])   . DM I (diabetes mellitus, type I), uncontrolled ([place])   . ETOH abuse    Past Surgical History  Procedure Laterality Date  . Appendectomy     Social History:  reports that he has never smoked. He does not have any smokeless tobacco history on file. He reports that he drinks alcohol. He reports that he does not use illicit drugs.  No Known Allergies  Family History  Problem  Relation Age of Onset  . Cancer Maternal Aunt   . Cancer Maternal Uncle   . Heart disease Paternal Grandfather     Prior to Admission medications   Medication Sig Start Date End Date Taking? Authorizing Provider  insulin NPH-regular Human (NOVOLIN 70/30 RELION) (70-30) 100 UNIT/ML injection Inject 28 Units into the skin 2 (two) times daily with a meal. 11/24/14  Yes Janece Canterbury, MD  acetaminophen (TYLENOL) 500 MG tablet Take 1,000-2,000 mg by mouth every 6 (six) hours as needed (for pain.).     Historical Provider, MD  blood glucose meter kit and supplies Dispense based on patient and insurance preference. Use up to four times daily as directed. (FOR ICD-9 250.00, 250.01). Patient not taking: Reported on 02/12/2015 11/24/14   Janece Canterbury, MD  fluconazole (DIFLUCAN) 200 MG tablet Take 1 tablet (200 mg total) by mouth daily. Patient not taking: Reported on 02/12/2015 11/24/14   Janece Canterbury, MD  pantoprazole (PROTONIX) 40 MG tablet Take 1 tablet (40 mg total) by mouth 2 (two) times daily. Patient not taking: Reported on 02/12/2015 11/24/14   Janece Canterbury, MD    Physical Exam: Filed Vitals:   02/12/15 0215  BP: 153/89  Pulse: 136  Temp: 98.1 F (36.7 C)  TempSrc: Oral  Resp: 18  SpO2: 100%    General: Alert, Awake and Oriented to Time, Place and Person. Appear in moderate distress Eyes: PERRL ENT: Oral Mucosa is edema noted posterior pharynx moist. Neck: no JVD Cardiovascular: S1 and  S2 Present, no Murmur, Peripheral Pulses Present Respiratory: Bilateral Air entry equal and Decreased,  Clear to Auscultation, no Crackles, no wheezes Abdomen: Bowel Sound present, Soft and no tenderness Skin: Facial diffuse Rash Extremities: no Pedal edema, no calf tenderness Neurologic: Grossly no focal neuro deficit.  Labs on Admission:  CBC:  Recent Labs Lab 02/12/15 0210  WBC 16.9*  NEUTROABS 14.5*  HGB 17.3*  HCT 49.4  MCV 100.0  PLT 219    CMP     Component Value  Date/Time   NA 130* 02/12/2015 0210   K 5.8* 02/12/2015 0210   CL 93* 02/12/2015 0210   CO2 6* 02/12/2015 0210   GLUCOSE 359* 02/12/2015 0210   BUN 16 02/12/2015 0210   CREATININE 1.45* 02/12/2015 0210   CREATININE 0.91 11/30/2014 1438   CALCIUM 9.6 02/12/2015 0210   PROT 6.6 11/30/2014 1438   ALBUMIN 3.6 11/30/2014 1438   AST 17 11/30/2014 1438   ALT 12 11/30/2014 1438   ALKPHOS 106 11/30/2014 1438   BILITOT 0.5 11/30/2014 1438   GFRNONAA >60 02/12/2015 0210   GFRNONAA >89 11/30/2014 1438   GFRAA >60 02/12/2015 0210   GFRAA >89 11/30/2014 1438    No results for input(s): CKTOTAL, CKMB, CKMBINDEX, TROPONINI in the last 168 hours. BNP (last 3 results) No results for input(s): BNP in the last 8760 hours.  ProBNP (last 3 results) No results for input(s): PROBNP in the last 8760 hours.   Radiological Exams on Admission: No results found. Assessment/Plan 1. DKA (diabetic ketoacidoses) ([place]) The patient presents with complains of nausea vomiting abdominal pain. Workup shows that he has significant acidosis with anion gap of 31. Most likely consistent with DKA. With the patient having tachycardia, nausea vomiting, facial rash, mild renal insufficiency as well as a significant acidosis he would be admitted in stepdown unit for close monitoring. I will give him IV hydration. Patient is only on insulin drip. Next and monitor BMP every 4 hours. Patient currently on glucose stabilizer protocol Patient will need assistance with his medications as well as follow-up on discharge.  2.  AKI (acute kidney injury) ([place]) Mild most like is secondary to DKA. We give him IV hydration and monitor. Avoid nephrotoxic medications.  3  Facial rash Patient mentions this has been present for last 6 months. Next and he mentions it gets better with Benadryl. We will try Benadryl and Pepcid.  4  Hyperkalemia Most likely is dehydration. We'll continue monitoring BMP. Patient is oriented insulin  drip.  5  Leukocytosis Most episode with dehydration and stress. We'll monitor and check blood culture and sputum.  6  Mucositis Most likely with vomiting and chemical inflammation. We'll use Magic mouthwash. Check strep rapid screen  Nutrition: Nothing by mouth as of medications DVT Prophylaxis: subcutaneous Heparin  Advance goals of care discussion: Full code   Disposition: Admitted as inpatient, step-down unit.  Author: Berle Mull, MD [facility] Pager: [phone] 02/12/2015  If 7PM-7AM, please contact night-coverage [email or web address] Password [email or web address]

## 2015-02-12 NOTE — ED Notes (Signed)
Bed: WA03 Expected date:  Expected time:  Means of arrival:  Comments: EMS 24yo M N/V

## 2015-02-12 NOTE — ED Provider Notes (Signed)
CSN: [id number]     Arrival date & time 02/12/15  0202 History   First MD Initiated Contact with Patient 02/12/15 0215     Chief Complaint  Patient presents with  . N/V/D   . Hyperglycemia     (Consider location/radiation/quality/duration/timing/severity/associated sxs/prior Treatment) Patient is a 24 y.o. male presenting with hyperglycemia. The history is provided by the patient.  Hyperglycemia Blood sugar level PTA:  333 Severity:  Moderate Onset quality:  Unable to specify Timing:  Unable to specify Progression:  Unable to specify Chronicity:  Recurrent Diabetes status:  Controlled with insulin Current diabetic therapy:  Insulin Context: noncompliance   Relieved by:  None tried Ineffective treatments:  None tried Associated symptoms: dehydration and nausea   Associated symptoms: no dysuria, no fever and no shortness of breath   Risk factors: hx of DKA     Past Medical History  Diagnosis Date  . Anxiety   . Seizure ([place])   . DM I (diabetes mellitus, type I), uncontrolled ([place])   . ETOH abuse    Past Surgical History  Procedure Laterality Date  . Appendectomy     Family History  Problem Relation Age of Onset  . Cancer Maternal Aunt   . Cancer Maternal Uncle   . Heart disease Paternal Grandfather    Social History  Substance Use Topics  . Smoking status: Never Smoker   . Smokeless tobacco: None  . Alcohol Use: 0.0 oz/week    0 Standard drinks or equivalent per week    Review of Systems  Constitutional: Negative for fever and chills.  Respiratory: Negative for shortness of breath.   Gastrointestinal: Positive for nausea.  Genitourinary: Negative for dysuria and frequency.  Skin: Negative for rash.  All other systems reviewed and are negative.     Allergies  Review of patient's allergies indicates no known allergies.  Home Medications   Prior to Admission medications   Medication Sig Start Date End Date Taking? Authorizing Provider  acetaminophen  (TYLENOL) 500 MG tablet Take 1,000-2,000 mg by mouth every 6 (six) hours as needed (for pain.).     Historical Provider, MD  blood glucose meter kit and supplies Dispense based on patient and insurance preference. Use up to four times daily as directed. (FOR ICD-9 250.00, 250.01). Patient not taking: Reported on 02/12/2015 11/24/14   Janece Canterbury, MD  insulin NPH-regular Human (NOVOLIN 70/30 RELION) (70-30) 100 UNIT/ML injection Inject 28 Units into the skin 2 (two) times daily with a meal. 02/14/15   Theodis Blaze, MD  menthol-cetylpyridinium (CEPACOL) 3 MG lozenge Take 1 lozenge (3 mg total) by mouth as needed for sore throat. 02/14/15   Theodis Blaze, MD   BP 129/86 mmHg  Pulse 86  Temp(Src) 98.2 F (36.8 C) (Oral)  Resp 20  Ht $R'5\' 6"'mF$  (1.676 m)  Wt 159 lb 6.3 oz (72.3 kg)  BMI 25.74 kg/m2  SpO2 100% Physical Exam  Constitutional: He appears well-developed and well-nourished.  HENT:  Head: Normocephalic.  Eyes: Pupils are equal, round, and reactive to light.  Neck: Normal range of motion.  Cardiovascular: Normal rate.   Pulmonary/Chest: Effort normal and breath sounds normal.  Abdominal: Soft. Bowel sounds are normal.  Musculoskeletal: Normal range of motion.  Neurological: He is alert.  Skin: Skin is warm. No rash noted.  Nursing note and vitals reviewed.   ED Course  Procedures (including critical care time) Labs Review Labs Reviewed  CBC WITH DIFFERENTIAL/PLATELET - Abnormal; Notable for the following:  WBC 16.9 (*)    Hemoglobin 17.3 (*)    MCH 35.0 (*)    Neutro Abs 14.5 (*)    All other components within normal limits  BASIC METABOLIC PANEL - Abnormal; Notable for the following:    Sodium 130 (*)    Potassium 5.8 (*)    Chloride 93 (*)    CO2 6 (*)    Glucose, Bld 359 (*)    Creatinine, Ser 1.45 (*)    Anion gap 31 (*)    All other components within normal limits  BLOOD GAS, VENOUS - Abnormal; Notable for the following:    pH, Ven 7.070 (*)    pCO2, Ven  21.1 (*)    Bicarbonate 5.8 (*)    Acid-base deficit 24.7 (*)    All other components within normal limits  URINALYSIS, ROUTINE W REFLEX MICROSCOPIC (NOT AT [facility]) - Abnormal; Notable for the following:    Glucose, UA >1000 (*)    Hgb urine dipstick MODERATE (*)    Ketones, ur >80 (*)    Protein, ur 100 (*)    All other components within normal limits  BASIC METABOLIC PANEL - Abnormal; Notable for the following:    Sodium 134 (*)    CO2 8 (*)    Glucose, Bld 146 (*)    Anion gap 24 (*)    All other components within normal limits  BASIC METABOLIC PANEL - Abnormal; Notable for the following:    Sodium 130 (*)    CO2 12 (*)    Glucose, Bld 149 (*)    Calcium 8.8 (*)    Anion gap 16 (*)    All other components within normal limits  BASIC METABOLIC PANEL - Abnormal; Notable for the following:    Sodium 132 (*)    CO2 18 (*)    Glucose, Bld 135 (*)    Calcium 8.8 (*)    All other components within normal limits  BASIC METABOLIC PANEL - Abnormal; Notable for the following:    Sodium 127 (*)    CO2 18 (*)    Glucose, Bld 222 (*)    Calcium 8.5 (*)    All other components within normal limits  PHOSPHORUS - Abnormal; Notable for the following:    Phosphorus 5.5 (*)    All other components within normal limits  BETA-HYDROXYBUTYRIC ACID - Abnormal; Notable for the following:    Beta-Hydroxybutyric Acid >8.00 (*)    All other components within normal limits  HEMOGLOBIN A1C - Abnormal; Notable for the following:    Hgb A1c MFr Bld 10.1 (*)    All other components within normal limits  GLUCOSE, CAPILLARY - Abnormal; Notable for the following:    Glucose-Capillary 132 (*)    All other components within normal limits  URINE MICROSCOPIC-ADD ON - Abnormal; Notable for the following:    Casts HYALINE CASTS (*)    All other components within normal limits  GLUCOSE, CAPILLARY - Abnormal; Notable for the following:    Glucose-Capillary 128 (*)    All other components within normal limits   LIPASE, BLOOD - Abnormal; Notable for the following:    Lipase 15 (*)    All other components within normal limits  CBC - Abnormal; Notable for the following:    WBC 18.5 (*)    MCH 34.6 (*)    All other components within normal limits  GLUCOSE, CAPILLARY - Abnormal; Notable for the following:    Glucose-Capillary 182 (*)  All other components within normal limits  GLUCOSE, CAPILLARY - Abnormal; Notable for the following:    Glucose-Capillary 227 (*)    All other components within normal limits  GLUCOSE, CAPILLARY - Abnormal; Notable for the following:    Glucose-Capillary 225 (*)    All other components within normal limits  GLUCOSE, CAPILLARY - Abnormal; Notable for the following:    Glucose-Capillary 167 (*)    All other components within normal limits  GLUCOSE, CAPILLARY - Abnormal; Notable for the following:    Glucose-Capillary 183 (*)    All other components within normal limits  GLUCOSE, CAPILLARY - Abnormal; Notable for the following:    Glucose-Capillary 160 (*)    All other components within normal limits  GLUCOSE, CAPILLARY - Abnormal; Notable for the following:    Glucose-Capillary 120 (*)    All other components within normal limits  GLUCOSE, CAPILLARY - Abnormal; Notable for the following:    Glucose-Capillary 145 (*)    All other components within normal limits  GLUCOSE, CAPILLARY - Abnormal; Notable for the following:    Glucose-Capillary 131 (*)    All other components within normal limits  GLUCOSE, CAPILLARY - Abnormal; Notable for the following:    Glucose-Capillary 175 (*)    All other components within normal limits  GLUCOSE, CAPILLARY - Abnormal; Notable for the following:    Glucose-Capillary 212 (*)    All other components within normal limits  GLUCOSE, CAPILLARY - Abnormal; Notable for the following:    Glucose-Capillary 237 (*)    All other components within normal limits  GLUCOSE, CAPILLARY - Abnormal; Notable for the following:     Glucose-Capillary 148 (*)    All other components within normal limits  BASIC METABOLIC PANEL - Abnormal; Notable for the following:    Sodium 133 (*)    Potassium 3.3 (*)    CO2 19 (*)    Glucose, Bld 129 (*)    All other components within normal limits  GLUCOSE, CAPILLARY - Abnormal; Notable for the following:    Glucose-Capillary 117 (*)    All other components within normal limits  GLUCOSE, CAPILLARY - Abnormal; Notable for the following:    Glucose-Capillary 126 (*)    All other components within normal limits  GLUCOSE, CAPILLARY - Abnormal; Notable for the following:    Glucose-Capillary 140 (*)    All other components within normal limits  BASIC METABOLIC PANEL - Abnormal; Notable for the following:    Sodium 132 (*)    CO2 19 (*)    Glucose, Bld 218 (*)    Calcium 8.8 (*)    All other components within normal limits  CBC - Abnormal; Notable for the following:    RBC 4.06 (*)    HCT 38.6 (*)    All other components within normal limits  GLUCOSE, CAPILLARY - Abnormal; Notable for the following:    Glucose-Capillary 172 (*)    All other components within normal limits  GLUCOSE, CAPILLARY - Abnormal; Notable for the following:    Glucose-Capillary 209 (*)    All other components within normal limits  GLUCOSE, CAPILLARY - Abnormal; Notable for the following:    Glucose-Capillary 208 (*)    All other components within normal limits  BASIC METABOLIC PANEL - Abnormal; Notable for the following:    Sodium 133 (*)    Potassium 3.4 (*)    CO2 19 (*)    Glucose, Bld 136 (*)    Calcium 8.7 (*)  All other components within normal limits  GLUCOSE, CAPILLARY - Abnormal; Notable for the following:    Glucose-Capillary 168 (*)    All other components within normal limits  GLUCOSE, CAPILLARY - Abnormal; Notable for the following:    Glucose-Capillary 142 (*)    All other components within normal limits  GLUCOSE, CAPILLARY - Abnormal; Notable for the following:     Glucose-Capillary 138 (*)    All other components within normal limits  GLUCOSE, CAPILLARY - Abnormal; Notable for the following:    Glucose-Capillary 128 (*)    All other components within normal limits  GLUCOSE, CAPILLARY - Abnormal; Notable for the following:    Glucose-Capillary 138 (*)    All other components within normal limits  GLUCOSE, CAPILLARY - Abnormal; Notable for the following:    Glucose-Capillary 183 (*)    All other components within normal limits  GLUCOSE, CAPILLARY - Abnormal; Notable for the following:    Glucose-Capillary 230 (*)    All other components within normal limits  GLUCOSE, CAPILLARY - Abnormal; Notable for the following:    Glucose-Capillary 288 (*)    All other components within normal limits  GLUCOSE, CAPILLARY - Abnormal; Notable for the following:    Glucose-Capillary 196 (*)    All other components within normal limits  CBG MONITORING, ED - Abnormal; Notable for the following:    Glucose-Capillary 369 (*)    All other components within normal limits  CBG MONITORING, ED - Abnormal; Notable for the following:    Glucose-Capillary 341 (*)    All other components within normal limits  CBG MONITORING, ED - Abnormal; Notable for the following:    Glucose-Capillary 210 (*)    All other components within normal limits  CULTURE, BLOOD (ROUTINE X 2)  CULTURE, BLOOD (ROUTINE X 2)  MRSA PCR SCREENING  RAPID STREP SCREEN (NOT AT [facility])  URINE RAPID DRUG SCREEN, HOSP PERFORMED  ETHANOL  MAGNESIUM  LACTIC ACID, PLASMA  PROCALCITONIN  GLUCOSE, CAPILLARY  GLUCOSE, CAPILLARY  PROCALCITONIN  GLUCOSE, CAPILLARY  GLUCOSE, CAPILLARY  CBC WITH DIFFERENTIAL/PLATELET    Imaging Review No results found. I have personally reviewed and evaluated these images and lab results as part of my medical decision-making.   EKG Interpretation   Date/Time:  Monday February 12 2015 02:08:58 EDT Ventricular Rate:  129 PR Interval:  100 QRS Duration: 76 QT Interval:   294 QTC Calculation: 431 R Axis:   37 Text Interpretation:  Sinus tachycardia Confirmed by [facility]  MD,  APRIL ([id number]) on 02/12/2015 2:43:09 AM      MDM   Final diagnoses:  Diabetic ketoacidosis without coma associated with other specified diabetes mellitus ([place])         Junius Creamer, NP 02/14/15 2109  Veatrice Kells, MD 02/14/15 2311

## 2015-02-12 NOTE — Progress Notes (Signed)
   02/12/15 1100  Clinical Encounter Type  Visited With Patient  Visit Type Initial;Psychological support;Spiritual support;Critical Care  Referral From Nurse  Consult/Referral To Chaplain  Spiritual Encounters  Spiritual Needs Emotional;Other (Comment) (Pastoral Conversation)  Stress Factors  Patient Stress Factors Health changes;Family relationships   Chaplain visited with patient per referral by the Charge Nurse. The nursing staff stated that the patient had not been very cooperative with his treatment and that he suffers from alcohol use.  The patient was asleep when the Chaplain arrived, but woke up to speak with the Chaplain. The patient stated that he was doing better, but that his throat was still sore. The patient states that the mouth wash that he was given by the nurse has helped ease his throat and mouth pain. He also stated that the tylenol he was given has helped with his headache. The patient indicated that he has good family support, but that his father has not been happy with him over how the patient has been taking care of himself.  The patient states that he has gotten used to being in the hospital and likes how the staff treats him. He understands that he needs to take better care of himself to keep from ending up in the hospital again.  The patient seems to be emotionally stable and understand the consequences of not taking care of his physical health.  The patient would like follow-up spiritual care.  The Chaplain will follow-up with the patient at a later time.

## 2015-02-12 NOTE — ED Notes (Signed)
Pt presents with n/v/ fever and hyperglycemia, type 1 diabetic which he endorses is poorly controlled, states he does not have money for insulin, chart review reveals multiple hospitalizations for DKA, states symptoms started yesterday around 11a.m.

## 2015-02-12 NOTE — ED Notes (Signed)
Patient is an insulin-dependent diabetic who presents for evaluation of nausea, vomiting and hyperglycemia. He states he has not been taking his prescribed dose of insulin (Novolog 70/30 25 units) due to not having money. He last checked his blood sugar on 02/11/15 with result in the 430s. He reports blurred vision when looking at lights and increased thirst.

## 2015-02-13 DIAGNOSIS — E875 Hyperkalemia: Secondary | ICD-10-CM | POA: Diagnosis present

## 2015-02-13 DIAGNOSIS — E1010 Type 1 diabetes mellitus with ketoacidosis without coma: Secondary | ICD-10-CM | POA: Diagnosis present

## 2015-02-13 DIAGNOSIS — R112 Nausea with vomiting, unspecified: Secondary | ICD-10-CM

## 2015-02-13 DIAGNOSIS — E1065 Type 1 diabetes mellitus with hyperglycemia: Secondary | ICD-10-CM | POA: Diagnosis present

## 2015-02-13 DIAGNOSIS — D72829 Elevated white blood cell count, unspecified: Secondary | ICD-10-CM | POA: Diagnosis present

## 2015-02-13 DIAGNOSIS — E872 Acidosis: Secondary | ICD-10-CM

## 2015-02-13 DIAGNOSIS — E8729 Other acidosis: Secondary | ICD-10-CM | POA: Diagnosis present

## 2015-02-13 DIAGNOSIS — Z9119 Patient's noncompliance with other medical treatment and regimen: Secondary | ICD-10-CM

## 2015-02-13 DIAGNOSIS — E876 Hypokalemia: Secondary | ICD-10-CM | POA: Diagnosis present

## 2015-02-13 LAB — CBC
HCT: 38.6 % — ABNORMAL LOW (ref 39.0–52.0)
Hemoglobin: 13.8 g/dL (ref 13.0–17.0)
MCH: 34 pg (ref 26.0–34.0)
MCHC: 35.8 g/dL (ref 30.0–36.0)
MCV: 95.1 fL (ref 78.0–100.0)
Platelets: 151 10*3/uL (ref 150–400)
RBC: 4.06 MIL/uL — ABNORMAL LOW (ref 4.22–5.81)
RDW: 12.1 % (ref 11.5–15.5)
WBC: 4.8 10*3/uL (ref 4.0–10.5)

## 2015-02-13 LAB — BASIC METABOLIC PANEL
Anion gap: 9 (ref 5–15)
Anion gap: 10 (ref 5–15)
Anion gap: 8 (ref 5–15)
BUN: 8 mg/dL (ref 6–20)
BUN: 7 mg/dL (ref 6–20)
BUN: 6 mg/dL (ref 6–20)
CO2: 19 mmol/L — ABNORMAL LOW (ref 22–32)
CO2: 19 mmol/L — ABNORMAL LOW (ref 22–32)
CO2: 19 mmol/L — ABNORMAL LOW (ref 22–32)
Calcium: 9 mg/dL (ref 8.9–10.3)
Calcium: 8.8 mg/dL — ABNORMAL LOW (ref 8.9–10.3)
Calcium: 8.7 mg/dL — ABNORMAL LOW (ref 8.9–10.3)
Chloride: 105 mmol/L (ref 101–111)
Chloride: 103 mmol/L (ref 101–111)
Chloride: 106 mmol/L (ref 101–111)
Creatinine, Ser: 0.83 mg/dL (ref 0.61–1.24)
Creatinine, Ser: 0.79 mg/dL (ref 0.61–1.24)
Creatinine, Ser: 0.76 mg/dL (ref 0.61–1.24)
GFR calc Af Amer: >60 mL/min (ref >60)
GFR calc Af Amer: >60 mL/min (ref >60)
GFR calc Af Amer: >60 mL/min (ref >60)
GFR calc non Af Amer: >60 mL/min (ref >60)
GFR calc non Af Amer: >60 mL/min (ref >60)
GFR calc non Af Amer: >60 mL/min (ref >60)
Glucose, Bld: 129 mg/dL — ABNORMAL HIGH (ref 65–99)
Glucose, Bld: 218 mg/dL — ABNORMAL HIGH (ref 65–99)
Glucose, Bld: 136 mg/dL — ABNORMAL HIGH (ref 65–99)
Potassium: 3.3 mmol/L — ABNORMAL LOW (ref 3.5–5.1)
Potassium: 3.6 mmol/L (ref 3.5–5.1)
Potassium: 3.4 mmol/L — ABNORMAL LOW (ref 3.5–5.1)
Sodium: 133 mmol/L — ABNORMAL LOW (ref 135–145)
Sodium: 132 mmol/L — ABNORMAL LOW (ref 135–145)
Sodium: 133 mmol/L — ABNORMAL LOW (ref 135–145)

## 2015-02-13 LAB — HEMOGLOBIN A1C
HEMOGLOBIN A1C: 10.1 % — AB (ref 4.8–5.6)
Mean Plasma Glucose: 243 mg/dL

## 2015-02-13 LAB — GLUCOSE, CAPILLARY
GLUCOSE-CAPILLARY: 126 mg/dL — AB (ref 65–99)
GLUCOSE-CAPILLARY: 138 mg/dL — AB (ref 65–99)
GLUCOSE-CAPILLARY: 142 mg/dL — AB (ref 65–99)
GLUCOSE-CAPILLARY: 168 mg/dL — AB (ref 65–99)
GLUCOSE-CAPILLARY: 172 mg/dL — AB (ref 65–99)
GLUCOSE-CAPILLARY: 183 mg/dL — AB (ref 65–99)
GLUCOSE-CAPILLARY: 230 mg/dL — AB (ref 65–99)
GLUCOSE-CAPILLARY: 288 mg/dL — AB (ref 65–99)
Glucose-Capillary: 140 mg/dL — ABNORMAL HIGH (ref 65–99)
Glucose-Capillary: 209 mg/dL — ABNORMAL HIGH (ref 65–99)
Glucose-Capillary: 208 mg/dL — ABNORMAL HIGH (ref 65–99)
Glucose-Capillary: 128 mg/dL — ABNORMAL HIGH (ref 65–99)
Glucose-Capillary: 138 mg/dL — ABNORMAL HIGH (ref 65–99)
Glucose-Capillary: 94 mg/dL (ref 65–99)

## 2015-02-13 MED ORDER — INSULIN ASPART 100 UNIT/ML ~~LOC~~ SOLN: 0.0000 [IU] | Freq: Every day | SUBCUTANEOUS | Status: DC

## 2015-02-13 MED ORDER — INSULIN ASPART 100 UNIT/ML ~~LOC~~ SOLN
0.0000 [IU] | Freq: Three times a day (TID) | SUBCUTANEOUS | Status: DC
  Administered 2015-02-13: 5 [IU] via SUBCUTANEOUS
  Administered 2015-02-13: 8 [IU] via SUBCUTANEOUS
  Administered 2015-02-14: 3 [IU] via SUBCUTANEOUS

## 2015-02-13 MED ORDER — INSULIN GLARGINE 100 UNIT/ML ~~LOC~~ SOLN
10.0000 [IU] | Freq: Once | SUBCUTANEOUS | Status: AC
Start: 2015-02-13 — End: 2015-02-13
  Administered 2015-02-13: 10 [IU] via SUBCUTANEOUS
  Filled 2015-02-13: qty 0.1

## 2015-02-13 MED ORDER — POTASSIUM CHLORIDE 10 MEQ/100ML IV SOLN
10.0000 meq | INTRAVENOUS | Status: AC
  Administered 2015-02-13 (×3): 10 meq via INTRAVENOUS
  Filled 2015-02-13: qty 100

## 2015-02-13 MED ORDER — INSULIN ASPART PROT & ASPART (70-30 MIX) 100 UNIT/ML ~~LOC~~ SUSP
28.0000 [IU] | Freq: Two times a day (BID) | SUBCUTANEOUS | Status: DC
  Administered 2015-02-13 – 2015-02-14 (×2): 28 [IU] via SUBCUTANEOUS
  Filled 2015-02-13: qty 10

## 2015-02-13 NOTE — ED Provider Notes (Signed)
Medical screening examination/treatment/procedure(s) were performed by non-physician practitioner and as supervising physician I was immediately available for consultation/collaboration.   EKG Interpretation   Date/Time:  Monday February 12 2015 02:08:58 EDT Ventricular Rate:  129 PR Interval:  100 QRS Duration: 76 QT Interval:  294 QTC Calculation: 431 R Axis:   37 Text Interpretation:  Sinus tachycardia Confirmed by [facility]  MD,  Jelicia Nantz ([id number]) on 02/12/2015 2:43:09 AM       Zed Wanninger, MD 02/13/15 [id number]

## 2015-02-13 NOTE — Plan of Care (Signed)
Problem: Phase II Progression Outcomes Goal: Acidosis resolved (CO2 > 20, ketones negative) Outcome: Not Met (add Reason) 19

## 2015-02-13 NOTE — Progress Notes (Signed)
Notified K.Schorr NP of bmet, new orders noted.

## 2015-02-13 NOTE — Progress Notes (Signed)
Spoke with patient this AM.  Patient [facility] 70/[facility] vial.  [facility].  Still working at [employer] and stated he usually makes enough money to purchase his insulin, however, this last time he ran out and did not have enough money to buy more.  Encourage patient to [facility].  Advised patient that he may be able to get enrolled into an insulin Rx assistance program and that he should ask about this at his next PCP visit at the clinic.  Discussed all the treatments we have provided to patient thus far.  Explained to patient that we will be transitioning him off the IV insulin drip to SQ insulin this AM.  Note orders for Lantus 10 units X 1 dose to be given for transition.    MD- Please make sure to start patient's 70/30 insulin tonight with supper along with Novolog Sensitive SSI (0-9 units) TID AC + HS     --Will follow patient during hospitalization--  Ambrose FinlandJeannine Johnston Magaby Rumberger RN, MSN, CDE Diabetes Coordinator Inpatient Glycemic Control Team Team Pager: [phone] (8a-5p)

## 2015-02-13 NOTE — Progress Notes (Addendum)
Inpatient Diabetes Program Recommendations  AACE/ADA: New Consensus Statement on Inpatient Glycemic Control (2015)  Target Ranges:  Prepandial:   less than 140 mg/dL      Peak postprandial:   less than 180 mg/dL (1-2 hours)      Critically ill patients:  140 - 180 mg/dL   Results for Christopher Robles, Christopher Robles (MRN [id number]) as of 02/13/2015 09:09  Ref. Range 02/13/2015 04:45  Sodium Latest Ref Range: 135-145 mmol/L 132 (L)  Potassium Latest Ref Range: 3.5-5.1 mmol/L 3.6  Chloride Latest Ref Range: 101-111 mmol/L 103  CO2 Latest Ref Range: 22-32 mmol/L 19 (L)  BUN Latest Ref Range: 6-20 mg/dL 7  Creatinine Latest Ref Range: 0.61-1.24 mg/dL 0.79  Calcium Latest Ref Range: 8.9-10.3 mg/dL 8.8 (L)  EGFR (Non-African Amer.) Latest Ref Range: >60 mL/min >60  EGFR (African American) Latest Ref Range: >60 mL/min >60  Glucose Latest Ref Range: 65-99 mg/dL 218 (H)  Anion gap Latest Ref Range: 5-15  10    Admit with: DKA  History: Diabetes, ETOH Abuse  Home DM Meds: Reli-On 70/30 Insulin- 28 units bidwc  Current Insulin Orders: IV Insulin drip per DKA Orders    -This DM Coordinator spoke extensively with patient about his DM care at home during his last hospitalization in July (see DM Coordinator note from 11/21/14 for details of our conversation). Counseled patient about DKA, about his DM care at home, and encouraged patient to buy a CBG meter OTC at [facility] and check his CBGs more frequently at home. During that conversation, patient stated he often runs out of insulin b/c he cannot afford it. During his July hospitalization, patient was switched to Reli-On 70/30 insulin so that he could purchase it for $25 per vial at [facility]. Patient followed up with Dr. Lorayne Marek at the [facility] on 11/30/14. Patient is supposed to return for Diabetes follow-up at the [facility] clinic around 03/02/15 per Dr. Reine Just notes.  -Note current A1c is 10.1%. Last A1c was 10.2%  back in July.  Expected elevated A1c as patient stated he runs out of insulin frequently.     MD- Note CO2 19 this AM and Anion Gap 10.  If patient does not have nausea or vomiting, could go ahead and transition to home 70/30 insulin regimen.   Please make sure to start patient's 70/30 insulin at least 1 hour prior to insulin drip being stopped.    --Will follow patient during hospitalization--  Wyn Quaker RN, MSN, CDE Diabetes Coordinator Inpatient Glycemic Control Team Team Pager: [phone] (8a-5p)

## 2015-02-13 NOTE — Progress Notes (Signed)
Notified K.Schorr NP of bmet, new orders noted and received.

## 2015-02-13 NOTE — Progress Notes (Signed)
Patient ID: Christopher Robles, male   DOB: [birth year], 24 y.o.   MRN: [id number] [facility] PROGRESS NOTE  OLUWATOBI VISSER NLG:[id number] DOB: 1990-11-15 DOA: 02/12/2015 PCP: Lorayne Marek, MD  Brief narrative:    24 y.o. male with past medical history of type 1 diabetes mellitus and non compliance with insulin regimen, history of alcohol abuse who presented to [facility] ED with nausea and epigastric pain started 1 day prior to this admission. Pain was 10/10 in intensity, sharp and radiating to lower abdomen. Pain was present with rest and has improved spontaneously since admission. No vomiting.   In ED, pt was hemodynamically stable. He was found to be in DKA, he was started on insulin drip and admitted to SDU for further management of DKA.  Anticipated discharge: Likely by 10/19 if CBG's stable.   Assessment/Plan:    Principal Problem:   DKA (diabetic ketoacidoses) (HCC) /  Nausea and vomiting / High anion gap metabolic acidosis  - DKA criteria met on admission with glucose of 369 on CBG, glucose of more than 1000 on UA, presence of ketones in UA, anion gap of 31 with CO2 of 2 - Admission to SDU and initiation of insulin drip on admission per DKA protocol - CBG's in past 24 hours: 168, 142, 138 - Still on insulin drip until CO2 normalizes, anion gap however has closed. - Once off of insulin drip, will transitioned to home insulin regimen. He is using NPH insulin 28units twice a day.    Active Problems:   Acute hyponatremia - Likely from dehydration - Continue current IV fluids - Sodium improving    DM (diabetes mellitus) type 1, uncontrolled, with ketoacidosis ([place]) / Noncompliance with diabetes treatment - A1c on this admission 10.1 indicating poor glycemic control, likely from non compliance with diabetic treatment - Counseled on importance of insulin use and to follow up with [facility] regularly - Diabetic coordinator consulted, appreciate their input - Resume insulin regimen per home dose  once off of insulin drip    Leukocytosis - Likely in the setting of DKA, stress demargination - WBC count now WNL    Hyperkalemia / Hypokalemia - Likely in the setting of DKA - Corrected with insulin drip and then developed hypokalemia - Potassium then spontaneously resolved to WNL - Follow up BMP tomorrow am       DVT Prophylaxis   - Heparin subQ ordered    Code Status: Full.  Family Communication:  plan of care discussed with the patient Disposition Plan: monitor in SDU while on insulin drip.    IV access:  Peripheral IV  Procedures and diagnostic studies:    No results found.  Medical Consultants:  None   Other Consultants:  Diabetic coordinator  IAnti-Infectives:   None    Leisa Lenz, MD  [facility] Pager [phone]  Time spent in minutes: 25 minutes  If 7PM-7AM, please contact night-coverage [email or web address] Password [email or web address] 02/13/2015, 8:14 AM   LOS: 1 day    HPI/Subjective: No acute overnight events. Patient reports feeling little better this am.   Objective: Filed Vitals:   02/13/15 0500 02/13/15 0600 02/13/15 0700 02/13/15 0800  BP:  119/81  135/95  Pulse: 69 77 84 86  Temp:      TempSrc:      Resp: _0 Height:      Weight:      SpO2: 100% 99% 99% 100%    Intake/Output Summary (Last 24 hours) at 02/13/15 0814 Last data  filed at 02/12/15 1600  Gross per 24 hour  Intake 1024.28 ml  Output      0 ml  Net 1024.28 ml    Exam:   General:  Pt is alert, follows commands appropriately, not in acute distress  Cardiovascular: Regular rate and rhythm, S1/S2, no murmurs  Respiratory: Clear to auscultation bilaterally, no wheezing, no crackles, no rhonchi  Abdomen: Soft, non tender, non distended, bowel sounds present  Extremities: No edema, pulses DP and PT palpable bilaterally  Neuro: Grossly nonfocal  Data Reviewed: Basic Metabolic Panel:  Recent Labs Lab 02/12/15 0210  02/12/15 1153 02/12/15 1643  02/12/15 2028 02/13/15 0015 02/13/15 0445  NA 130*  < > 130* 132* 127* 133* 132*  K 5.8*  < > 4.2 4.0 4.0 3.3* 3.6  CL 93*  < > 102 103 101 105 103  CO2 6*  < > 12* 18* 18* 19* 19*  GLUCOSE 359*  < > 149* 135* 222* 129* 218*  BUN 16  < > _0 CREATININE 1.45*  < > 1.03 0.84 0.86 0.83 0.79  CALCIUM 9.6  < > 8.8* 8.8* 8.5* 9.0 8.8*  MG 2.3  --   --   --   --   --   --   PHOS 5.5*  --   --   --   --   --   --   < > = values in this interval not displayed. Liver Function Tests: No results for input(s): AST, ALT, ALKPHOS, BILITOT, PROT, ALBUMIN in the last 168 hours.  Recent Labs Lab 02/12/15 0750  LIPASE 15*   No results for input(s): AMMONIA in the last 168 hours. CBC:  Recent Labs Lab 02/12/15 0210 02/12/15 0750 02/13/15 0445  WBC 16.9* 18.5* 4.8  NEUTROABS 14.5*  --   --   HGB 17.3* 15.9 13.8  HCT 49.4 45.4 38.6*  MCV 100.0 98.9 95.1  PLT 219 219 151   Cardiac Enzymes: No results for input(s): CKTOTAL, CKMB, CKMBINDEX, TROPONINI in the last 168 hours. BNP: Invalid input(s): POCBNP CBG:  Recent Labs Lab 02/13/15 0345 02/13/15 0438 02/13/15 0549 02/13/15 0652 02/13/15 0754  GLUCAP 209* 208* 168* 142* 138*    Recent Results (from the past 240 hour(s))  MRSA PCR Screening     Status: None   Collection Time: 02/12/15  5:56 AM  Result Value Ref Range Status   MRSA by PCR NEGATIVE NEGATIVE Final    Comment:        The GeneXpert MRSA Assay (FDA approved for NASAL specimens only), is one component of a comprehensive MRSA colonization surveillance program. It is not intended to diagnose MRSA infection nor to guide or monitor treatment for MRSA infections.      Scheduled Meds: . famotidine  20 mg Oral BID  . heparin  5,000 Units Subcutaneous 3 times per day  . magic mouthwash w/lidocaine  15 mL Oral QID   Continuous Infusions: . sodium chloride 125 mL/hr at 02/12/15 0445  . dextrose 5 % and 0.45% NaCl 125 mL/hr at 02/13/15 0551  . insulin  (NOVOLIN-R) infusion 1.6 Units/hr (02/13/15 0756)

## 2015-02-13 NOTE — Progress Notes (Signed)
Report given to [facility] and all questions answered. Patient will notify family. All belongings and medications sent with patient.

## 2015-02-13 NOTE — Plan of Care (Signed)
Problem: Phase I Progression Outcomes Goal: CBGs steadily decreasing on IV insulin drip Outcome: Completed/Met Date Met:  02/13/15 Prior to admission to unit  Goal: Acidosis resolving Outcome: Completed/Met Date Met:  02/13/15 Prior to admission to unit Goal: NPO or per MD order Outcome: Completed/Met Date Met:  02/13/15 Prior to admission to unit Goal: K+ level approaching normal with therapy Outcome: Completed/Met Date Met:  02/13/15 3.4 Goal: Nausea/vomiting controlled with antiemetics Outcome: Completed/Met Date Met:  02/13/15 No complaint at this time

## 2015-02-14 ENCOUNTER — Telehealth: Payer: Self-pay

## 2015-02-14 DIAGNOSIS — E871 Hypo-osmolality and hyponatremia: Secondary | ICD-10-CM

## 2015-02-14 DIAGNOSIS — E1110 Type 2 diabetes mellitus with ketoacidosis without coma: Secondary | ICD-10-CM | POA: Insufficient documentation

## 2015-02-14 LAB — PROCALCITONIN: Procalcitonin: <0.1 ng/mL

## 2015-02-14 LAB — GLUCOSE, CAPILLARY
GLUCOSE-CAPILLARY: 196 mg/dL — AB (ref 65–99)
GLUCOSE-CAPILLARY: 82 mg/dL (ref 65–99)

## 2015-02-14 MED ORDER — INSULIN NPH ISOPHANE & REGULAR (70-30) 100 UNIT/ML ~~LOC~~ SUSP: 28.0000 [IU] | Freq: Two times a day (BID) | SUBCUTANEOUS | Status: DC

## 2015-02-14 MED ORDER — MENTHOL 3 MG MT LOZG: 1.0000 | LOZENGE | OROMUCOSAL | Status: DC | PRN

## 2015-02-14 MED ORDER — POTASSIUM CHLORIDE CRYS ER 20 MEQ PO TBCR
40.0000 meq | EXTENDED_RELEASE_TABLET | Freq: Once | ORAL | Status: AC
  Administered 2015-02-14: 40 meq via ORAL
  Filled 2015-02-14: qty 2

## 2015-02-14 NOTE — Discharge Summary (Signed)
Physician Discharge Summary  Christopher Robles HGD:[id number] DOB: December 04, 1990 DOA: 02/12/2015  PCP: Lorayne Marek, MD  Admit date: 02/12/2015 Discharge date: 02/14/2015  Recommendations for Outpatient Follow-up:  1. Pt will need to follow up with PCP in 2-3 weeks post discharge 2. Please obtain BMP to evaluate electrolytes and kidney function 3. Please also check CBC to evaluate Hg and Hct levels  Discharge Diagnoses:  Principal Problem:   DKA (diabetic ketoacidoses) ([place]) Active Problems:   Nausea and vomiting   Acute hyponatremia   Noncompliance with diabetes treatment   Leukocytosis   Hypokalemia   DM (diabetes mellitus) type 1, uncontrolled, with ketoacidosis (HCC)   Hyperkalemia   High anion gap metabolic acidosis  Discharge Condition: Stable  Diet recommendation: Heart healthy diet discussed in details   Brief narrative:    24 y.o. male with past medical history of type 1 diabetes mellitus and non compliance with insulin regimen, history of alcohol abuse who presented to [facility] ED with nausea and epigastric pain started 1 day prior to this admission. Pain was 10/10 in intensity, sharp and radiating to lower abdomen. Pain was present with rest and has improved spontaneously since admission. No vomiting.   In ED, pt was hemodynamically stable. He was found to be in DKA, he was started on insulin drip and admitted to SDU for further management of DKA.  Anticipated discharge: Likely by 10/19 if CBG's stable.   Assessment/Plan:    Principal Problem:  DKA (diabetic ketoacidoses) (HCC) / Nausea and vomiting / High anion gap metabolic acidosis  - DKA criteria met on admission with glucose of 369 on CBG, glucose of more than 1000 on UA, presence of ketones in UA, anion gap of 31 with CO2 of 2 - resolved with insulin drip and hydration - now tolerating diet well and wants to go home  Active Problems:  Acute hyponatremia - Likely from dehydration - responded to IVF   DM  (diabetes mellitus) type 1, uncontrolled, with ketoacidosis ([place]) / Noncompliance with diabetes treatment - A1c on this admission 10.1 indicating poor glycemic control, likely from non compliance with diabetic treatment - Counseled on importance of insulin use and to follow up with PCP regularly - Diabetic coordinator consulted, appreciate their input - Resume insulin regimen per home dose upon discharge    Leukocytosis - Likely in the setting of DKA, stress demargination - resolved    Hyperkalemia / Hypokalemia - Likely in the setting of DKA - Corrected with insulin drip and then developed hypokalemia - Potassium supplemented prior to discharge   Code Status: Full.  Family Communication: plan of care discussed with the patient Disposition Plan: Home   IV access:  Peripheral IV  Procedures and diagnostic studies:   No results found.  Medical Consultants:  None   Other Consultants:  Diabetic coordinator  IAnti-Infectives:   None       Discharge Exam: Filed Vitals:   02/14/15 0549  BP: 129/86  Pulse: 86  Temp: 98.2 F (36.8 C)  Resp: 20   Filed Vitals:   02/13/15 1200 02/13/15 1437 02/13/15 2151 02/14/15 0549  BP: 108/72 128/79 129/81 129/86  Pulse: 54 109 87 86  Temp: 98.1 F (36.7 C) 98.5 F (36.9 C) 98.2 F (36.8 C) 98.2 F (36.8 C)  TempSrc: Oral Oral Oral Oral  Resp: _0 Height:  5' 6" (1.676 m)    Weight:      SpO2: 100% 100% 100% 100%    General: Pt  is alert, follows commands appropriately, not in acute distress Cardiovascular: Regular rate and rhythm, S1/S2 +, no murmurs, no rubs, no gallops Respiratory: Clear to auscultation bilaterally, no wheezing, no crackles, no rhonchi Abdominal: Soft, non tender, non distended, bowel sounds +, no guarding Extremities: no edema, no cyanosis, pulses palpable bilaterally DP and PT Neuro: Grossly nonfocal  Discharge Instructions  Discharge Instructions    Diet - low sodium  heart healthy    Complete by:  As directed      Increase activity slowly    Complete by:  As directed             Medication List    STOP taking these medications        fluconazole 200 MG tablet  Commonly known as:  DIFLUCAN     pantoprazole 40 MG tablet  Commonly known as:  PROTONIX      TAKE these medications        acetaminophen 500 MG tablet  Commonly known as:  TYLENOL  Take 1,000-2,000 mg by mouth every 6 (six) hours as needed (for pain.).     blood glucose meter kit and supplies  Dispense based on patient and insurance preference. Use up to four times daily as directed. (FOR ICD-9 250.00, 250.01).     insulin NPH-regular Human (70-30) 100 UNIT/ML injection  Commonly known as:  NOVOLIN 70/30 RELION  Inject 28 Units into the skin 2 (two) times daily with a meal.     menthol-cetylpyridinium 3 MG lozenge  Commonly known as:  CEPACOL  Take 1 lozenge (3 mg total) by mouth as needed for sore throat.           Follow-up Information    Call Faye Ramsay, MD.   Specialty:  Internal Medicine   Why:  As needed call my cell phone [phone]   Contact information:   [address] [place] [address] [phone]        The results of significant diagnostics from this hospitalization (including imaging, microbiology, ancillary and laboratory) are listed below for reference.     Microbiology: Recent Results (from the past 240 hour(s))  MRSA PCR Screening     Status: None   Collection Time: 02/12/15  5:56 AM  Result Value Ref Range Status   MRSA by PCR NEGATIVE NEGATIVE Final    Comment:        The GeneXpert MRSA Assay (FDA approved for NASAL specimens only), is one component of a comprehensive MRSA colonization surveillance program. It is not intended to diagnose MRSA infection nor to guide or monitor treatment for MRSA infections.   Culture, blood (routine x 2)     Status: None (Preliminary result)   Collection Time:  02/12/15  7:50 AM  Result Value Ref Range Status   Specimen Description BLOOD RIGHT ARM  Final   Special Requests BOTTLES DRAWN AEROBIC AND ANAEROBIC [place]  Final   Culture   Final    NO GROWTH 1 DAY Performed at [facility]    Report Status PENDING  Incomplete  Culture, blood (routine x 2)     Status: None (Preliminary result)   Collection Time: 02/12/15  8:00 AM  Result Value Ref Range Status   Specimen Description BLOOD RIGHT ARM  Final   Special Requests BOTTLES DRAWN AEROBIC ONLY [place]  Final   Culture   Final    NO GROWTH 1 DAY Performed at [facility]    Report Status PENDING  Incomplete     Labs: Basic Metabolic Panel:  Recent Labs Lab 02/12/15 0210  02/12/15 1643 02/12/15 2028 02/13/15 0015 02/13/15 0445 02/13/15 0915  NA 130*  < > 132* 127* 133* 132* 133*  K 5.8*  < > 4.0 4.0 3.3* 3.6 3.4*  CL 93*  < > 103 101 105 103 106  CO2 6*  < > 18* 18* 19* 19* 19*  GLUCOSE 359*  < > 135* 222* 129* 218* 136*  BUN 16  < > _0 CREATININE 1.45*  < > 0.84 0.86 0.83 0.79 0.76  CALCIUM 9.6  < > 8.8* 8.5* 9.0 8.8* 8.7*  MG 2.3  --   --   --   --   --   --   PHOS 5.5*  --   --   --   --   --   --   < > = values in this interval not displayed. Liver Function Tests: No results for input(s): AST, ALT, ALKPHOS, BILITOT, PROT, ALBUMIN in the last 168 hours.  Recent Labs Lab 02/12/15 0750  LIPASE 15*   No results for input(s): AMMONIA in the last 168 hours. CBC:  Recent Labs Lab 02/12/15 0210 02/12/15 0750 02/13/15 0445  WBC 16.9* 18.5* 4.8  NEUTROABS 14.5*  --   --   HGB 17.3* 15.9 13.8  HCT 49.4 45.4 38.6*  MCV 100.0 98.9 95.1  PLT 219 219 151   Cardiac Enzymes: No results for input(s): CKTOTAL, CKMB, CKMBINDEX, TROPONINI in the last 168 hours. BNP: BNP (last 3 results) No results for input(s): BNP in the last 8760 hours.  ProBNP (last 3 results) No results for input(s): PROBNP in the last 8760 hours.  CBG:  Recent Labs Lab  02/13/15 1104 02/13/15 1209 02/13/15 1615 02/13/15 2146 02/14/15 0751  GLUCAP 183* 230* 288* 94 196*     SIGNED: Time coordinating discharge: 30 minutes  MAGICK-, , MD  [facility] 02/14/2015, 10:19 AM Pager [phone]  If 7PM-7AM, please contact night-coverage [email or web address] Password [email or web address]

## 2015-02-14 NOTE — Care Management Note (Signed)
Case Management Note  Patient Details  Name: Christopher GearingRyan J Robles MRN: [id number] Date of Birth: June 12, 1990  Subjective/Objective:  [facility], left message w/[facility]-Jane to set up pcp appt, & [facility]. [facility] pharmacy(they are closed-will call back @ 10a) to confirm cost of Novolog 70/30u-[facility].He has strips,lancets already.Patient will have access to getting insurance also @ [facility].Await response from [facility] liason.                  Action/Plan:d/c plan home.   Expected Discharge Date:   (unknown)               Expected Discharge Plan:  Home/Self Care  In-House Referral:  NA  Discharge planning Services  CM Consult, Medication Assistance, Indigent Health Clinic  Post Acute Care Choice:  NA Choice offered to:  NA  DME Arranged:  N/A DME Agency:  NA  HH Arranged:  NA HH Agency:  NA  Status of Service:  Completed, signed off  Medicare Important Message Given:    Date Medicare IM Given:    Medicare IM give by:    Date Additional Medicare IM Given:    Additional Medicare Important Message give by:     If discussed at Long Length of Stay Meetings, dates discussed:    Additional Comments:  Lanier ClamMahabir, Jerauld Bostwick, RN 02/14/2015, 9:44 AM

## 2015-02-14 NOTE — Discharge Instructions (Signed)
Diabetes and Exercise Exercising regularly is important. It is not just about losing weight. It has many health benefits, such as:  Improving your overall fitness, flexibility, and endurance.  Increasing your bone density.  Helping with weight control.  Decreasing your body fat.  Increasing your muscle strength.  Reducing stress and tension.  Improving your overall health. People with diabetes who exercise gain additional benefits because exercise:  Reduces appetite.  Improves the body's use of blood sugar (glucose).  Helps lower or control blood glucose.  Decreases blood pressure.  Helps control blood lipids (such as cholesterol and triglycerides).  Improves the body's use of the hormone insulin by:  Increasing the body's insulin sensitivity.  Reducing the body's insulin needs.  Decreases the risk for heart disease because exercising:  Lowers cholesterol and triglycerides levels.  Increases the levels of good cholesterol (such as high-density lipoproteins [HDL]) in the body.  Lowers blood glucose levels. YOUR ACTIVITY PLAN  Choose an activity that you enjoy, and set realistic goals. To exercise safely, you should begin practicing any new physical activity slowly, and gradually increase the intensity of the exercise over time. Your health care provider or diabetes educator can help create an activity plan that works for you. General recommendations include:  Encouraging children to engage in at least 60 minutes of physical activity each day.  Stretching and performing strength training exercises, such as yoga or weight lifting, at least 2 times per week.  Performing a total of at least 150 minutes of moderate-intensity exercise each week, such as brisk walking or water aerobics.  Exercising at least 3 days per week, making sure you allow no more than 2 consecutive days to pass without exercising.  Avoiding long periods of inactivity (90 minutes or more). When you  have to spend an extended period of time sitting down, take frequent breaks to walk or stretch. RECOMMENDATIONS FOR EXERCISING WITH TYPE 1 OR TYPE 2 DIABETES   Check your blood glucose before exercising. If blood glucose levels are greater than 240 mg/dL, check for urine ketones. Do not exercise if ketones are present.  Avoid injecting insulin into areas of the body that are going to be exercised. For example, avoid injecting insulin into:  The arms when playing tennis.  The legs when jogging.  Keep a record of:  Food intake before and after you exercise.  Expected peak times of insulin action.  Blood glucose levels before and after you exercise.  The type and amount of exercise you have done.  Review your records with your health care provider. Your health care provider will help you to develop guidelines for adjusting food intake and insulin amounts before and after exercising.  If you take insulin or oral hypoglycemic agents, watch for signs and symptoms of hypoglycemia. They include:  Dizziness.  Shaking.  Sweating.  Chills.  Confusion.  Drink plenty of water while you exercise to prevent dehydration or heat stroke. Body water is lost during exercise and must be replaced.  Talk to your health care provider before starting an exercise program to make sure it is safe for you. Remember, almost any type of activity is better than none.   This information is not intended to replace advice given to you by your health care provider. Make sure you discuss any questions you have with your health care provider.   Document Released: 07/05/2003 Document Revised: 08/29/2014 Document Reviewed: 09/21/2012 Elsevier Interactive Patient Education 2016 [employer] Inc.  Blood Glucose Monitoring, Adult Monitoring your blood  glucose (also know as blood sugar) helps you to manage your diabetes. It also helps you and your health care provider monitor your diabetes and determine how well your  treatment plan is working. WHY SHOULD YOU MONITOR YOUR BLOOD GLUCOSE?  It can help you understand how food, exercise, and medicine affect your blood glucose.  It allows you to know what your blood glucose is at any given moment. You can quickly tell if you are having low blood glucose (hypoglycemia) or high blood glucose (hyperglycemia).  It can help you and your health care provider know how to adjust your medicines.  It can help you understand how to manage an illness or adjust medicine for exercise. WHEN SHOULD YOU TEST? Your health care provider will help you decide how often you should check your blood glucose. This may depend on the type of diabetes you have, your diabetes control, or the types of medicines you are taking. Be sure to write down all of your blood glucose readings so that this information can be reviewed with your health care provider. See below for examples of testing times that your health care provider may suggest. Type 1 Diabetes  Test at least 2 times per day if your diabetes is well controlled, if you are using an insulin pump, or if you perform multiple daily injections.  If your diabetes is not well controlled or if you are sick, you may need to test more often.  It is a good idea to also test:  Before every insulin injection.  Before and after exercise.  Between meals and 2 hours after a meal.  Occasionally between 2:00 a.m. and 3:00 a.m. Type 2 Diabetes  If you are taking insulin, test at least 2 times per day. However, it is best to test before every insulin injection.  If you take medicines by mouth (orally), test 2 times a day.  If you are on a controlled diet, test once a day.  If your diabetes is not well controlled or if you are sick, you may need to monitor more often. HOW TO MONITOR YOUR BLOOD GLUCOSE Supplies Needed  Blood glucose meter.  Test strips for your meter. Each meter has its own strips. You must use the strips that go with  your own meter.  A pricking needle (lancet).  A device that holds the lancet (lancing device).  A journal or log book to write down your results. Procedure  Wash your hands with soap and water. Alcohol is not preferred.  Prick the side of your finger (not the tip) with the lancet.  Gently milk the finger until a small drop of blood appears.  Follow the instructions that come with your meter for inserting the test strip, applying blood to the strip, and using your blood glucose meter. Other Areas to Get Blood for Testing Some meters allow you to use other areas of your body (other than your finger) to test your blood. These areas are called alternative sites. The most common alternative sites are:  The forearm.  The thigh.  The back area of the lower leg.  The palm of the hand. The blood flow in these areas is slower. Therefore, the blood glucose values you get may be delayed, and the numbers are different from what you would get from your fingers. Do not use alternative sites if you think you are having hypoglycemia. Your reading will not be accurate. Always use a finger if you are having hypoglycemia. Also, if you cannot  feel your lows (hypoglycemia unawareness), always use your fingers for your blood glucose checks. ADDITIONAL TIPS FOR GLUCOSE MONITORING  Do not reuse lancets.  Always carry your supplies with you.  All blood glucose meters have a 24-hour "hotline" number to call if you have questions or need help.  Adjust (calibrate) your blood glucose meter with a control solution after finishing a few boxes of strips. BLOOD GLUCOSE RECORD KEEPING It is a good idea to keep a daily record or log of your blood glucose readings. Most glucose meters, if not all, keep your glucose records stored in the meter. Some meters come with the ability to download your records to your home computer. Keeping a record of your blood glucose readings is especially helpful if you are wanting to  look for patterns. Make notes to go along with the blood glucose readings because you might forget what happened at that exact time. Keeping good records helps you and your health care provider to work together to achieve good diabetes management.    This information is not intended to replace advice given to you by your health care provider. Make sure you discuss any questions you have with your health care provider.   Document Released: 04/17/2003 Document Revised: 05/05/2014 Document Reviewed: 09/06/2012 Elsevier Interactive Patient Education [employer]

## 2015-02-14 NOTE — Care Management Note (Signed)
Case Management Note  Patient Details  Name: Christopher GearingRyan J Robles MRN: [id number] Date of Birth: Jul 22, 1990  Subjective/Objective:   [facility] appt set. [facility]free samples). He does not qualify for TCC services, but [facility]. Patient voiced understanding.                 Action/Plan:d/c home no further d/c needs.   Expected Discharge Date:   (unknown)               Expected Discharge Plan:  Home/Self Care  In-House Referral:  NA  Discharge planning Services  CM Consult, Medication Assistance, Indigent Health Clinic  Post Acute Care Choice:  NA Choice offered to:  NA  DME Arranged:  N/A DME Agency:  NA  HH Arranged:  NA HH Agency:  NA  Status of Service:  Completed, signed off  Medicare Important Message Given:    Date Medicare IM Given:    Medicare IM give by:    Date Additional Medicare IM Given:    Additional Medicare Important Message give by:     If discussed at Long Length of Stay Meetings, dates discussed:    Additional Comments:  Lanier ClamMahabir, Jin Shockley, RN 02/14/2015, 10:36 AM

## 2015-02-14 NOTE — Telephone Encounter (Signed)
Message received from Lanier ClamKathy Mahabir, RN CM inquiring if the patient is eligible for the Transitional Care Clinic ( TCC). After review of the record if was determined that he does not qualify for the TCC; but  [facility] @ 1030 and the information was placed on the AVS.  An update was provided to K. Mahabir, RN CM.

## 2015-02-14 NOTE — Progress Notes (Signed)
Pt is A&O, ambulatory without assist. Discharge Instruction reviewed, Patient denied questions and or concerns r/t to education. Pt encouraged to f/u at the [place] [facility] for insulin and appointments. Father will be transporting pt to clinic and to home.

## 2015-02-17 LAB — CULTURE, BLOOD (ROUTINE X 2)
CULTURE: NO GROWTH
Culture: NO GROWTH

## 2015-02-21 ENCOUNTER — Ambulatory Visit: Payer: Self-pay | Attending: Family Medicine | Admitting: Family Medicine

## 2015-02-21 ENCOUNTER — Encounter: Payer: Self-pay | Admitting: Family Medicine

## 2015-02-21 VITALS — BP 158/102 | HR 97 | Temp 98.5°F | Resp 18 | Ht 66.0 in | Wt 166.0 lb

## 2015-02-21 DIAGNOSIS — I10 Essential (primary) hypertension: Secondary | ICD-10-CM | POA: Insufficient documentation

## 2015-02-21 DIAGNOSIS — R030 Elevated blood-pressure reading, without diagnosis of hypertension: Secondary | ICD-10-CM

## 2015-02-21 DIAGNOSIS — E1010 Type 1 diabetes mellitus with ketoacidosis without coma: Secondary | ICD-10-CM

## 2015-02-21 DIAGNOSIS — IMO0001 Reserved for inherently not codable concepts without codable children: Secondary | ICD-10-CM

## 2015-02-21 DIAGNOSIS — E109 Type 1 diabetes mellitus without complications: Secondary | ICD-10-CM | POA: Insufficient documentation

## 2015-02-21 LAB — GLUCOSE, POCT (MANUAL RESULT ENTRY): POC Glucose: 287 mg/dl — AB (ref 70–99)

## 2015-02-21 MED ORDER — INSULIN NPH ISOPHANE & REGULAR (70-30) 100 UNIT/ML ~~LOC~~ SUSP: SUBCUTANEOUS | Status: DC

## 2015-02-21 NOTE — Progress Notes (Signed)
CC: Follow-up from hospitalization for DKA (02/12/15-02/14/15)  HPI: Christopher Robles is a 24 y.o. male with a history of uncontrolled type 1 diabetes mellitus who recently presented to [facility] ED with abdominal pain, nausea and was found to be in DKA with a glucose of 369 on CBG and 1000 on UA, presence of ketones in his urine, anion gap of 31 with a bicarbonate of 2.  He was admitted to stepdown unit and commenced on IV fluids and insulin drip which he responded well to. He did have electrolyte derangements which were corrected. His WBC was elevated at 18.5 and this was thought to be secondary to stress demargination in the setting of DKA. He was seen by the diabetic educator; his condition improved and he was subsequently discharged and advised to follow-up here at the clinic.  Interval history: He reports taking his medications but faces a major challenge with his work schedule. He sometimes works first shift at other times works second shift and his eating is not regular, he wakes up around  11am-12noon and sometimes takes his medications late. I have reviewed his glucometer which reviews a range of random sugars from 170 to 400.  Patient has No headache, No chest pain, No abdominal pain - No Nausea, No new weakness tingling or numbness, No Cough - SOB.  No Known Allergies Past Medical History  Diagnosis Date  . Anxiety   . Seizure ([place])   . DM I (diabetes mellitus, type I), uncontrolled ([place])   . ETOH abuse    Current Outpatient Prescriptions on File Prior to Visit  Medication Sig Dispense Refill  . blood glucose meter kit and supplies Dispense based on patient and insurance preference. Use up to four times daily as directed. (FOR ICD-9 250.00, 250.01). 1 each 0  . insulin NPH-regular Human (NOVOLIN 70/30 RELION) (70-30) 100 UNIT/ML injection Inject 28 Units into the skin 2 (two) times daily with a meal. 20 mL 5  . acetaminophen (TYLENOL) 500 MG tablet Take 1,000-2,000 mg by mouth  every 6 (six) hours as needed (for pain.).     Marland Kitchen menthol-cetylpyridinium (CEPACOL) 3 MG lozenge Take 1 lozenge (3 mg total) by mouth as needed for sore throat. (Patient not taking: Reported on 02/21/2015) 100 tablet 5   No current facility-administered medications on file prior to visit.   Family History  Problem Relation Age of Onset  . Cancer Maternal Aunt   . Cancer Maternal Uncle   . Heart disease Paternal Grandfather    Social History   Social History  . Marital Status: Single    Spouse Name: N/A  . Number of Children: N/A  . Years of Education: N/A   Occupational History  . Not on file.   Social History Main Topics  . Smoking status: Never Smoker   . Smokeless tobacco: Not on file  . Alcohol Use: 0.0 oz/week    0 Standard drinks or equivalent per week  . Drug Use: No  . Sexual Activity: Not on file   Other Topics Concern  . Not on file   Social History Narrative    Review of Systems: Constitutional: Negative for fever, chills, diaphoresis, activity change, appetite change and fatigue. HENT: Negative for ear pain, nosebleeds, congestion, facial swelling, rhinorrhea, neck pain, neck stiffness and ear discharge.  Eyes: Negative for pain, discharge, redness, itching and visual disturbance. Respiratory: Negative for cough, choking, chest tightness, shortness of breath, wheezing and stridor.  Cardiovascular: Negative for chest pain, palpitations and leg  swelling. Gastrointestinal: Negative for abdominal distention. Genitourinary: Negative for dysuria, urgency, frequency, hematuria, flank pain, decreased urine volume, difficulty urinating and dyspareunia.  Musculoskeletal: Negative for back pain, joint swelling, arthralgias and gait problem. Neurological: Negative for dizziness, tremors, seizures, syncope, facial asymmetry, speech difficulty, weakness, light-headedness, numbness and headaches.  Hematological: Negative for adenopathy. Does not bruise/bleed  easily. Psychiatric/Behavioral: Negative for hallucinations, behavioral problems, confusion, dysphoric mood, decreased concentration and agitation.    Objective:   Filed Vitals:   02/21/15 1022  BP: 158/102  Pulse: 97  Temp: 98.5 F (36.9 C)  Resp: 18    Physical Exam: Constitutional: Patient appears well-developed and well-nourished. No distress. HENT: Normocephalic, atraumatic, External right and left ear normal. Oropharynx is clear and moist.  Eyes: Conjunctivae and EOM are normal. PERRLA, no scleral icterus. Neck: Normal ROM. Neck supple. No JVD. No tracheal deviation. No thyromegaly. CVS: RRR, S1/S2 +, no murmurs, no gallops, no carotid bruit.  Pulmonary: Effort and breath sounds normal, no stridor, rhonchi, wheezes, rales.  Abdominal: Soft. BS +,  no distension, tenderness, rebound or guarding.  Musculoskeletal: Normal range of motion. No edema and no tenderness.  Lymphadenopathy: No lymphadenopathy noted, cervical, inguinal or axillary Neuro: Alert. Normal reflexes, muscle tone coordination. No cranial nerve deficit. Skin: Skin is warm and dry. No rash noted. Not diaphoretic. No erythema. No pallor. Psychiatric: Normal mood and affect. Behavior, judgment, thought content normal.  Lab Results  Component Value Date   WBC 4.8 02/13/2015   HGB 13.8 02/13/2015   HCT 38.6* 02/13/2015   MCV 95.1 02/13/2015   PLT 151 02/13/2015   Lab Results  Component Value Date   CREATININE 0.76 02/13/2015   BUN 6 02/13/2015   NA 133* 02/13/2015   K 3.4* 02/13/2015   CL 106 02/13/2015   CO2 19* 02/13/2015    Lab Results  Component Value Date   HGBA1C 10.1* 02/12/2015   Lipid Panel     Component Value Date/Time   CHOL 158 08/09/2007 2026   TRIG 146 08/09/2007 2026   HDL 36 08/09/2007 2026   CHOLHDL 4.4 Ratio 08/09/2007 2026   VLDL 29 08/09/2007 2026   LDLCALC 93 08/09/2007 2026       Assessment and plan:  Type 1 diabetes mellitus: Uncontrolled with A1c of 10.1, CBG  287. Poor control due to noncompliance, patient forgetting to take medications and due to his tight work schedule as sometimes he works first shift at other times works second shift. I am increasing his Novolin 70/30 to 35 units in the morning and 30 units at nighttime and I will review his blood sugar logs at this next office visit.  Elevated blood pressure: His blood pressure ranged from 108-129/54-86 during hospitalization. I will make no changes to his regimen at this time I will reassess his blood pressure at his next visit.  , Amao, MD. [facility] [phone] 02/21/2015, 10:40 AM 

## 2015-02-21 NOTE — Progress Notes (Signed)
Pt's here for HFU for DM. Pt reports feeling good, no pain today  Pt BP was elevated, but states he drank a lot of caffeine and ate before OV.  Pt reports taking meds today.

## 2015-03-07 ENCOUNTER — Ambulatory Visit: Payer: Self-pay | Admitting: Family Medicine

## 2015-03-08 ENCOUNTER — Ambulatory Visit: Payer: Self-pay | Attending: Family Medicine | Admitting: Family Medicine

## 2015-03-08 ENCOUNTER — Encounter: Payer: Self-pay | Admitting: Family Medicine

## 2015-03-08 VITALS — BP 137/90 | HR 113 | Temp 98.2°F | Resp 18 | Ht 67.0 in | Wt 157.0 lb

## 2015-03-08 DIAGNOSIS — E1010 Type 1 diabetes mellitus with ketoacidosis without coma: Secondary | ICD-10-CM | POA: Insufficient documentation

## 2015-03-08 LAB — GLUCOSE, POCT (MANUAL RESULT ENTRY): POC Glucose: 144 mg/dl — AB (ref 70–99)

## 2015-03-08 MED ORDER — INSULIN GLARGINE 100 UNIT/ML SOLOSTAR PEN: 30.0000 [IU] | PEN_INJECTOR | Freq: Every day | SUBCUTANEOUS | Status: DC

## 2015-03-08 MED ORDER — LISINOPRIL 2.5 MG PO TABS: 2.5000 mg | ORAL_TABLET | Freq: Every day | ORAL | Status: DC

## 2015-03-08 MED ORDER — INSULIN PEN NEEDLE 31G X 8 MM MISC: Status: DC

## 2015-03-08 MED ORDER — INSULIN ASPART 100 UNIT/ML FLEXPEN: PEN_INJECTOR | SUBCUTANEOUS | Status: DC

## 2015-03-08 NOTE — Progress Notes (Signed)
Pt's here for DM f/up. Pt denies any pain today.  Pt's didn't taken insulin this morning. He reports feeling better.

## 2015-03-08 NOTE — Progress Notes (Signed)
Subjective:    Patient ID: REMIJIO Robles, male    DOB: [birth year], 24 y.o.   MRN: [id number]  HPI 24 year old male with uncontrolled Type 1 DM (A1c 10.1) here for a follow up office visit. At his last visit his Novolin 70/30 was increased to 35 units in the morning and 30 units in the evening but he has not been compliant with this regimen. He takes 28 units in the morning and 10-15 units in the evening. Review of his blood sugars indicate fluctuations from 28 to 376 with no particular pattern. His meals are not regular and neither are the times of insulin administration.  Past Medical History  Diagnosis Date  . Anxiety   . Seizure ([place])   . DM I (diabetes mellitus, type I), uncontrolled ([place])   . ETOH abuse     Past Surgical History  Procedure Laterality Date  . Appendectomy      Social History   Social History  . Marital Status: Single    Spouse Name: N/A  . Number of Children: N/A  . Years of Education: N/A   Occupational History  . Not on file.   Social History Main Topics  . Smoking status: Never Smoker   . Smokeless tobacco: Not on file  . Alcohol Use: 0.0 oz/week    0 Standard drinks or equivalent per week  . Drug Use: No  . Sexual Activity: Not on file   Other Topics Concern  . Not on file   Social History Narrative    No Known Allergies  Current Outpatient Prescriptions on File Prior to Visit  Medication Sig Dispense Refill  . blood glucose meter kit and supplies Dispense based on patient and insurance preference. Use up to four times daily as directed. (FOR ICD-9 250.00, 250.01). 1 each 0  . acetaminophen (TYLENOL) 500 MG tablet Take 1,000-2,000 mg by mouth every 6 (six) hours as needed (for pain.).     Marland Kitchen menthol-cetylpyridinium (CEPACOL) 3 MG lozenge Take 1 lozenge (3 mg total) by mouth as needed for sore throat. (Patient not taking: Reported on 03/08/2015) 100 tablet 5   No current facility-administered medications on file prior to visit.      Review of Systems  Constitutional: Negative for activity change and appetite change.  HENT: Negative for sinus pressure and sore throat.   Eyes: Negative for visual disturbance.  Respiratory: Negative for cough, chest tightness and shortness of breath.   Cardiovascular: Negative for chest pain and leg swelling.  Gastrointestinal: Negative for abdominal pain, diarrhea, constipation and abdominal distention.  Endocrine: Negative.   Genitourinary: Negative for dysuria.  Musculoskeletal: Negative for myalgias and joint swelling.  Skin: Negative for rash.  Allergic/Immunologic: Negative.   Neurological: Negative for weakness, light-headedness and numbness.  Psychiatric/Behavioral: Negative for suicidal ideas and dysphoric mood.       Objective: Filed Vitals:   03/08/15 1208  BP: 137/90  Pulse: 113  Temp: 98.2 F (36.8 C)  TempSrc: Oral  Resp: 18  Height: $Remove'5\' 7"'[id number]$  (1.702 m)  Weight: 157 lb (71.215 kg)  SpO2: 97%      Physical Exam  Constitutional: He is oriented to person, place, and time. He appears well-developed and well-nourished.  Cardiovascular: Normal heart sounds and intact distal pulses.  Tachycardia present.   No murmur heard. Pulmonary/Chest: Effort normal and breath sounds normal. He has no wheezes. He has no rales. He exhibits no tenderness.  Abdominal: Soft. Bowel sounds are normal. He exhibits no distension  and no mass. There is no tenderness.  Musculoskeletal: Normal range of motion.  Neurological: He is alert and oriented to person, place, and time.          Assessment & Plan:  Type 1 diabetes mellitus: Uncontrolled with A1c of 10.1 Switched from Novolog 70/30 to Lantus 30 units at bedtime and Novolog sliding scale (a paper copy of the sliding scale provided to the patient: for sugars 0-150= 0 units, 151-200=2, 201-250=4, 251-300=6, 301-350=8, 351-400=10, >.400=12) Clinical pharmacist called in to see him Blood sugar log will be reviewed at his next  visit

## 2015-03-09 LAB — MICROALBUMIN / CREATININE URINE RATIO
CREATININE, URINE: 159 mg/dL (ref 20–370)
Microalb Creat Ratio: 23 mcg/mg creat (ref <30)
Microalb, Ur: 3.6 mg/dL

## 2015-03-27 ENCOUNTER — Encounter: Payer: Self-pay | Admitting: Family Medicine

## 2015-03-27 ENCOUNTER — Ambulatory Visit: Payer: Self-pay | Attending: Family Medicine | Admitting: Family Medicine

## 2015-03-27 VITALS — BP 144/95 | HR 105 | Temp 97.6°F | Resp 16 | Ht 67.0 in | Wt 162.6 lb

## 2015-03-27 DIAGNOSIS — Z794 Long term (current) use of insulin: Secondary | ICD-10-CM | POA: Insufficient documentation

## 2015-03-27 DIAGNOSIS — E1065 Type 1 diabetes mellitus with hyperglycemia: Secondary | ICD-10-CM | POA: Insufficient documentation

## 2015-03-27 DIAGNOSIS — Z9114 Patient's other noncompliance with medication regimen: Secondary | ICD-10-CM | POA: Insufficient documentation

## 2015-03-27 DIAGNOSIS — I10 Essential (primary) hypertension: Secondary | ICD-10-CM | POA: Insufficient documentation

## 2015-03-27 DIAGNOSIS — F419 Anxiety disorder, unspecified: Secondary | ICD-10-CM | POA: Insufficient documentation

## 2015-03-27 DIAGNOSIS — F1010 Alcohol abuse, uncomplicated: Secondary | ICD-10-CM | POA: Insufficient documentation

## 2015-03-27 DIAGNOSIS — R000 Tachycardia, unspecified: Secondary | ICD-10-CM | POA: Insufficient documentation

## 2015-03-27 DIAGNOSIS — R030 Elevated blood-pressure reading, without diagnosis of hypertension: Secondary | ICD-10-CM

## 2015-03-27 DIAGNOSIS — E1010 Type 1 diabetes mellitus with ketoacidosis without coma: Secondary | ICD-10-CM

## 2015-03-27 DIAGNOSIS — IMO0001 Reserved for inherently not codable concepts without codable children: Secondary | ICD-10-CM

## 2015-03-27 DIAGNOSIS — Z79899 Other long term (current) drug therapy: Secondary | ICD-10-CM | POA: Insufficient documentation

## 2015-03-27 LAB — LIPID PANEL
CHOL/HDL RATIO: 2.1 ratio (ref <=5.0)
CHOLESTEROL: 175 mg/dL (ref 125–200)
HDL: 84 mg/dL (ref >=40)
TRIGLYCERIDES: 459 mg/dL — AB (ref <150)

## 2015-03-27 LAB — POCT URINALYSIS DIPSTICK
Bilirubin, UA: NEGATIVE
Blood, UA: NEGATIVE
Glucose, UA: 500
LEUKOCYTES UA: NEGATIVE
Nitrite, UA: NEGATIVE
Protein, UA: NEGATIVE
Spec Grav, UA: 1.02
UROBILINOGEN UA: 0.2
pH, UA: 6

## 2015-03-27 LAB — GLUCOSE, POCT (MANUAL RESULT ENTRY): POC Glucose: 304 mg/dl — AB (ref 70–99)

## 2015-03-27 MED ORDER — INSULIN GLARGINE 100 UNIT/ML SOLOSTAR PEN: 30.0000 [IU] | PEN_INJECTOR | Freq: Every day | SUBCUTANEOUS | Status: DC

## 2015-03-27 MED ORDER — INSULIN ASPART 100 UNIT/ML FLEXPEN: PEN_INJECTOR | SUBCUTANEOUS | Status: DC

## 2015-03-27 MED ORDER — LISINOPRIL 2.5 MG PO TABS: 2.5000 mg | ORAL_TABLET | Freq: Every day | ORAL | Status: DC

## 2015-03-27 NOTE — Patient Instructions (Signed)
Diabetes Mellitus and Food It is important for you to manage your blood sugar (glucose) level. Your blood glucose level can be greatly affected by what you eat. Eating healthier foods in the appropriate amounts throughout the day at about the same time each day will help you control your blood glucose level. It can also help slow or prevent worsening of your diabetes mellitus. Healthy eating may even help you improve the level of your blood pressure and reach or maintain a healthy weight.  General recommendations for healthful eating and cooking habits include:  Eating meals and snacks regularly. Avoid going long periods of time without eating to lose weight.  Eating a diet that consists mainly of plant-based foods, such as fruits, vegetables, nuts, legumes, and whole grains.  Using low-heat cooking methods, such as baking, instead of high-heat cooking methods, such as deep frying. Work with your dietitian to make sure you understand how to use the Nutrition Facts information on food labels. HOW CAN FOOD AFFECT ME? Carbohydrates Carbohydrates affect your blood glucose level more than any other type of food. Your dietitian will help you determine how many carbohydrates to eat at each meal and teach you how to count carbohydrates. Counting carbohydrates is important to keep your blood glucose at a healthy level, especially if you are using insulin or taking certain medicines for diabetes mellitus. Alcohol Alcohol can cause sudden decreases in blood glucose (hypoglycemia), especially if you use insulin or take certain medicines for diabetes mellitus. Hypoglycemia can be a life-threatening condition. Symptoms of hypoglycemia (sleepiness, dizziness, and disorientation) are similar to symptoms of having too much alcohol.  If your health care provider has given you approval to drink alcohol, do so in moderation and use the following guidelines:  Women should not have more than one drink per day, and men  should not have more than two drinks per day. One drink is equal to:  12 oz of beer.  5 oz of wine.  1 oz of hard liquor.  Do not drink on an empty stomach.  Keep yourself hydrated. Have water, diet soda, or unsweetened iced tea.  Regular soda, juice, and other mixers might contain a lot of carbohydrates and should be counted. WHAT FOODS ARE NOT RECOMMENDED? As you make food choices, it is important to remember that all foods are not the same. Some foods have fewer nutrients per serving than other foods, even though they might have the same number of calories or carbohydrates. It is difficult to get your body what it needs when you eat foods with fewer nutrients. Examples of foods that you should avoid that are high in calories and carbohydrates but low in nutrients include:  Trans fats (most processed foods list trans fats on the Nutrition Facts label).  Regular soda.  Juice.  Candy.  Sweets, such as cake, pie, doughnuts, and cookies.  Fried foods. WHAT FOODS CAN I EAT? Eat nutrient-rich foods, which will nourish your body and keep you healthy. The food you should eat also will depend on several factors, including:  The calories you need.  The medicines you take.  Your weight.  Your blood glucose level.  Your blood pressure level.  Your cholesterol level. You should eat a variety of foods, including:  Protein.  Lean cuts of meat.  Proteins low in saturated fats, such as fish, egg whites, and beans. Avoid processed meats.  Fruits and vegetables.  Fruits and vegetables that may help control blood glucose levels, such as apples, mangoes, and   yams.  Dairy products.  Choose fat-free or low-fat dairy products, such as milk, yogurt, and cheese.  Grains, bread, pasta, and rice.  Choose whole grain products, such as multigrain bread, whole oats, and brown rice. These foods may help control blood pressure.  Fats.  Foods containing healthful fats, such as nuts,  avocado, olive oil, canola oil, and fish. DOES EVERYONE WITH DIABETES MELLITUS HAVE THE SAME MEAL PLAN? Because every person with diabetes mellitus is different, there is not one meal plan that works for everyone. It is very important that you meet with a dietitian who will help you create a meal plan that is just right for you.   This information is not intended to replace advice given to you by your health care provider. Make sure you discuss any questions you have with your health care provider.   Document Released: 01/09/2005 Document Revised: 05/05/2014 Document Reviewed: 03/11/2013 Elsevier Interactive Patient Education 2016 Elsevier Inc.  

## 2015-03-27 NOTE — Progress Notes (Signed)
Patient forgot CBG log He reports still taking Novolin 30 in am and pm He reports sugars in 70's-80's mostly at night and is afraid to take complete insulin dose He has not gotten his Lisinopril yet He reports severe anxiety [id number] and drinks beer and liquor daily in varying amounts He states he cannot go to rehab because he has to work everyday Did not take his insulin last night or this am and ate a gravy biscuit before he came

## 2015-03-27 NOTE — Progress Notes (Signed)
Subjective:    Patient ID: Christopher Robles, male    DOB: 04-Feb-1991, 24 y.o.   MRN: [id number]  HPI Christopher Robles is a 24 year old male with a history of type 1 diabetes mellitus with severe fluctuations in blood sugar due to irregular eating pattern as a result of his work schedule. At his last office visit he was switched from NovoLog 70/30 to Lantus 30 units at bedtime to be used along with his NovoLog sliding scale and he had also gotten to see the clinical pharmacist for reinforcement of diabetic education.  He comes into the clinic today and never picked up his Lantus prescription and has not been taking lisinopril ED which was prescribed for renal protection. He has been taking his NovoLog 70/30 but skips his toes is when his sugars are low; last night he had a blood sugar of 86 and so did not take his evening dose of 70/30 and today's blood sugar is 304 in the clinic.  He suffers from anxiety and also admits to drinking a lot of alcohol and is not ready to quit at this time. Also states he cannot undergo rehabilitation because he has to work.  Past Medical History  Diagnosis Date  . Anxiety   . Seizure ([place])   . DM I (diabetes mellitus, type I), uncontrolled ([place])   . ETOH abuse     Past Surgical History  Procedure Laterality Date  . Appendectomy      No Known Allergies  Current Outpatient Prescriptions on File Prior to Visit  Medication Sig Dispense Refill  . acetaminophen (TYLENOL) 500 MG tablet Take 1,000-2,000 mg by mouth every 6 (six) hours as needed (for pain.).     Marland Kitchen blood glucose meter kit and supplies Dispense based on patient and insurance preference. Use up to four times daily as directed. (FOR ICD-9 250.00, 250.01). 1 each 0  . insulin aspart (NOVOLOG FLEXPEN) 100 UNIT/ML FlexPen Inject 0-3 units subcutaneously three times daily with meals as per sliding scale. 2 pen 3  . Insulin Glargine (LANTUS SOLOSTAR) 100 UNIT/ML Solostar Pen Inject 30 Units into the skin daily at  10 pm. 5 pen 2  . Insulin Pen Needle 31G X 8 MM MISC Use 4 times daily with meals and at bedtime as directed. 120 each 5  . lisinopril (PRINIVIL,ZESTRIL) 2.5 MG tablet Take 1 tablet (2.5 mg total) by mouth daily. 30 tablet 2  . menthol-cetylpyridinium (CEPACOL) 3 MG lozenge Take 1 lozenge (3 mg total) by mouth as needed for sore throat. (Patient not taking: Reported on 03/08/2015) 100 tablet 5   No current facility-administered medications on file prior to visit.       Review of Systems  Constitutional: Negative for activity change and appetite change.  HENT: Negative for sinus pressure and sore throat.   Eyes: Negative for visual disturbance.  Respiratory: Negative for cough, chest tightness, shortness of breath and wheezing.   Cardiovascular: Negative for chest pain and leg swelling.  Gastrointestinal: Negative for abdominal pain, diarrhea, constipation and abdominal distention.  Endocrine: Negative.   Genitourinary: Negative.  Negative for dysuria.  Musculoskeletal: Negative.  Negative for myalgias and joint swelling.  Skin: Negative for rash.  Allergic/Immunologic: Negative.   Neurological: Negative for weakness, light-headedness and numbness.  Psychiatric/Behavioral: Negative for suicidal ideas, behavioral problems and dysphoric mood.       Objective: Filed Vitals:   03/27/15 0918  BP: 144/95  Pulse: 105  Temp: 97.6 F (36.4 C)  Resp: 16  Physical Exam  Constitutional: He is oriented to person, place, and time. He appears well-developed and well-nourished.  Cardiovascular: Normal heart sounds and intact distal pulses.  Tachycardia present.   No murmur heard. Pulmonary/Chest: Effort normal and breath sounds normal. He has no wheezes. He has no rales. He exhibits no tenderness.  Abdominal: Soft. Bowel sounds are normal. He exhibits no distension and no mass. There is no tenderness.  Musculoskeletal: Normal range of motion.  Neurological: He is alert and oriented to  person, place, and time.          Assessment & Plan:  Type 1 diabetes mellitus:  Uncontrolled with A1c of 10.1 Poor compliance and adjustment of medication at his own discretion is largely contributory. Advised to pickup Lantus, esomeprazole, NovoLog which she has not been taking. Also to bring in blood sugar log for review at his next visit. Foot exam performed today; advised to schedule annual eye exam with optometrist or ophthalmologist Refuses influenza shot  Anxiety: This explains his tachycardia and he is refusing medications for this and is also refusing to see the LCSW for counseling sessions.   Alcohol abuse: He is also not interested in alcohol anonymous at this time. Has been counseled on cessation.  Elevated blood pressure: He has not been taking his Lisinopril as prescribed. Compliance emphasized.  This note has been created with Surveyor, quantity. Any transcriptional errors are unintentional.

## 2015-03-28 ENCOUNTER — Other Ambulatory Visit: Payer: Self-pay | Admitting: Family Medicine

## 2015-03-28 DIAGNOSIS — E781 Pure hyperglyceridemia: Secondary | ICD-10-CM

## 2015-03-28 DIAGNOSIS — E1069 Type 1 diabetes mellitus with other specified complication: Secondary | ICD-10-CM | POA: Insufficient documentation

## 2015-03-28 MED ORDER — GEMFIBROZIL 600 MG PO TABS: 600.0000 mg | ORAL_TABLET | Freq: Two times a day (BID) | ORAL | Status: DC

## 2015-03-29 ENCOUNTER — Telehealth: Payer: Self-pay | Admitting: *Deleted

## 2015-03-29 NOTE — Telephone Encounter (Signed)
-----   Message from Jaclyn ShaggyEnobong Amao, MD sent at 03/28/2015  8:27 AM EST ----- Triglycerides are severely elevated and so I have sent a rx for Gemfibrozil to his pharmacy.

## 2015-03-29 NOTE — Telephone Encounter (Signed)
Left HIPAA compliant message for patient to return RN call at [phone] 

## 2015-03-29 NOTE — Telephone Encounter (Signed)
Patient called back and DOB verified.  Results of high triglycerides and medication order given to patient.  Patient verbalized understanding and said he was coming in tomorrow to pick up his insulin and he would get the Gemfibrozil then.  I then asked if he had made an appointment to see our DM educator and he said he had not.  I asked him to ask for Hilberto Burzynski,RN when he comes in to the clinic tomorrow and I would help him.  He was very Adult nurseappreciative.

## 2015-04-26 ENCOUNTER — Ambulatory Visit: Payer: Self-pay | Admitting: Family Medicine

## 2015-05-04 ENCOUNTER — Ambulatory Visit: Payer: Self-pay | Admitting: Family Medicine

## 2015-05-09 ENCOUNTER — Ambulatory Visit: Payer: Self-pay | Attending: Family Medicine | Admitting: Family Medicine

## 2015-05-09 ENCOUNTER — Encounter: Payer: Self-pay | Admitting: Family Medicine

## 2015-05-09 VITALS — BP 149/95 | HR 116 | Temp 98.3°F | Resp 12 | Ht 66.0 in | Wt 171.2 lb

## 2015-05-09 DIAGNOSIS — F1290 Cannabis use, unspecified, uncomplicated: Secondary | ICD-10-CM | POA: Insufficient documentation

## 2015-05-09 DIAGNOSIS — Z9889 Other specified postprocedural states: Secondary | ICD-10-CM | POA: Insufficient documentation

## 2015-05-09 DIAGNOSIS — IMO0001 Reserved for inherently not codable concepts without codable children: Secondary | ICD-10-CM

## 2015-05-09 DIAGNOSIS — R030 Elevated blood-pressure reading, without diagnosis of hypertension: Secondary | ICD-10-CM | POA: Insufficient documentation

## 2015-05-09 DIAGNOSIS — Z794 Long term (current) use of insulin: Secondary | ICD-10-CM | POA: Insufficient documentation

## 2015-05-09 DIAGNOSIS — E109 Type 1 diabetes mellitus without complications: Secondary | ICD-10-CM | POA: Insufficient documentation

## 2015-05-09 DIAGNOSIS — Z79899 Other long term (current) drug therapy: Secondary | ICD-10-CM | POA: Insufficient documentation

## 2015-05-09 DIAGNOSIS — F1010 Alcohol abuse, uncomplicated: Secondary | ICD-10-CM | POA: Insufficient documentation

## 2015-05-09 DIAGNOSIS — F419 Anxiety disorder, unspecified: Secondary | ICD-10-CM | POA: Insufficient documentation

## 2015-05-09 DIAGNOSIS — E1010 Type 1 diabetes mellitus with ketoacidosis without coma: Secondary | ICD-10-CM

## 2015-05-09 LAB — POCT GLYCOSYLATED HEMOGLOBIN (HGB A1C): Hemoglobin A1C: 8

## 2015-05-09 LAB — GLUCOSE, POCT (MANUAL RESULT ENTRY): POC GLUCOSE: 240 mg/dL — AB (ref 70–99)

## 2015-05-09 MED ORDER — LISINOPRIL 2.5 MG PO TABS: 2.5000 mg | ORAL_TABLET | Freq: Every day | ORAL | Status: DC

## 2015-05-09 NOTE — Patient Instructions (Signed)
Diabetes Mellitus and Food It is important for you to manage your blood sugar (glucose) level. Your blood glucose level can be greatly affected by what you eat. Eating healthier foods in the appropriate amounts throughout the day at about the same time each day will help you control your blood glucose level. It can also help slow or prevent worsening of your diabetes mellitus. Healthy eating may even help you improve the level of your blood pressure and reach or maintain a healthy weight.  General recommendations for healthful eating and cooking habits include:  Eating meals and snacks regularly. Avoid going long periods of time without eating to lose weight.  Eating a diet that consists mainly of plant-based foods, such as fruits, vegetables, nuts, legumes, and whole grains.  Using low-heat cooking methods, such as baking, instead of high-heat cooking methods, such as deep frying. Work with your dietitian to make sure you understand how to use the Nutrition Facts information on food labels. HOW CAN FOOD AFFECT ME? Carbohydrates Carbohydrates affect your blood glucose level more than any other type of food. Your dietitian will help you determine how many carbohydrates to eat at each meal and teach you how to count carbohydrates. Counting carbohydrates is important to keep your blood glucose at a healthy level, especially if you are using insulin or taking certain medicines for diabetes mellitus. Alcohol Alcohol can cause sudden decreases in blood glucose (hypoglycemia), especially if you use insulin or take certain medicines for diabetes mellitus. Hypoglycemia can be a life-threatening condition. Symptoms of hypoglycemia (sleepiness, dizziness, and disorientation) are similar to symptoms of having too much alcohol.  If your health care provider has given you approval to drink alcohol, do so in moderation and use the following guidelines:  Women should not have more than one drink per day, and men  should not have more than two drinks per day. One drink is equal to:  12 oz of beer.  5 oz of wine.  1 oz of hard liquor.  Do not drink on an empty stomach.  Keep yourself hydrated. Have water, diet soda, or unsweetened iced tea.  Regular soda, juice, and other mixers might contain a lot of carbohydrates and should be counted. WHAT FOODS ARE NOT RECOMMENDED? As you make food choices, it is important to remember that all foods are not the same. Some foods have fewer nutrients per serving than other foods, even though they might have the same number of calories or carbohydrates. It is difficult to get your body what it needs when you eat foods with fewer nutrients. Examples of foods that you should avoid that are high in calories and carbohydrates but low in nutrients include:  Trans fats (most processed foods list trans fats on the Nutrition Facts label).  Regular soda.  Juice.  Candy.  Sweets, such as cake, pie, doughnuts, and cookies.  Fried foods. WHAT FOODS CAN I EAT? Eat nutrient-rich foods, which will nourish your body and keep you healthy. The food you should eat also will depend on several factors, including:  The calories you need.  The medicines you take.  Your weight.  Your blood glucose level.  Your blood pressure level.  Your cholesterol level. You should eat a variety of foods, including:  Protein.  Lean cuts of meat.  Proteins low in saturated fats, such as fish, egg whites, and beans. Avoid processed meats.  Fruits and vegetables.  Fruits and vegetables that may help control blood glucose levels, such as apples, mangoes, and   yams.  Dairy products.  Choose fat-free or low-fat dairy products, such as milk, yogurt, and cheese.  Grains, bread, pasta, and rice.  Choose whole grain products, such as multigrain bread, whole oats, and brown rice. These foods may help control blood pressure.  Fats.  Foods containing healthful fats, such as nuts,  avocado, olive oil, canola oil, and fish. DOES EVERYONE WITH DIABETES MELLITUS HAVE THE SAME MEAL PLAN? Because every person with diabetes mellitus is different, there is not one meal plan that works for everyone. It is very important that you meet with a dietitian who will help you create a meal plan that is just right for you.   This information is not intended to replace advice given to you by your health care provider. Make sure you discuss any questions you have with your health care provider.   Document Released: 01/09/2005 Document Revised: 05/05/2014 Document Reviewed: 03/11/2013 Elsevier Interactive Patient Education 2016 Elsevier Inc.  

## 2015-05-09 NOTE — Progress Notes (Signed)
Follow up on DM type 1 He did bring his meter and needs med refills

## 2015-05-10 NOTE — Progress Notes (Signed)
Subjective:    Patient ID: Christopher Robles, male    DOB: Sep 14, 1990, 25 y.o.   MRN: [id number]  HPI 25 year old male with a history of type 1 diabetes mellitus (A1c 8.1 today) who comes in for a  follow-up visit. He brings along with him his glucometer which reveals his blood sugars have improved significantly with most random sugars in the 250 range and he has had about 3 hyperglycemic readings of 350, 403, 420 and a single hypoglycemic reading of 67. He has irregular working hours and has been working hard to stay on track with a diabetic diet. He states he has adjusted his Lantus from 30 units to 25 units on days that he felt his sugar was low and does have some excess Relion insulin from his [place] which she does not want to go to waste.  He continues to struggle with alcohol abuse and has been refusing help. He does have sinus tachycardia which is as a result of white coat syndrome.  Past Medical History  Diagnosis Date  . Anxiety   . Seizure ([place])   . DM I (diabetes mellitus, type I), uncontrolled ([place])   . ETOH abuse     Past Surgical History  Procedure Laterality Date  . Appendectomy      Social History   Social History  . Marital Status: Single    Spouse Name: N/A  . Number of Children: N/A  . Years of Education: N/A   Occupational History  . Not on file.   Social History Main Topics  . Smoking status: Never Smoker   . Smokeless tobacco: Not on file  . Alcohol Use: 24.0 oz/week    0 Standard drinks or equivalent, 40 Cans of beer per week     Comment: unspecified amount but drinks beer/liquor daily  . Drug Use: Yes    Special: Marijuana  . Sexual Activity: Not on file   Other Topics Concern  . Not on file   Social History Narrative    No Known Allergies  Current Outpatient Prescriptions on File Prior to Visit  Medication Sig Dispense Refill  . acetaminophen (TYLENOL) 500 MG tablet Take 1,000-2,000 mg by mouth every 6 (six) hours as needed (for  pain.).     Marland Kitchen blood glucose meter kit and supplies Dispense based on patient and insurance preference. Use up to four times daily as directed. (FOR ICD-9 250.00, 250.01). 1 each 0  . gemfibrozil (LOPID) 600 MG tablet Take 1 tablet (600 mg total) by mouth 2 (two) times daily before a meal. 60 tablet 2  . insulin aspart (NOVOLOG FLEXPEN) 100 UNIT/ML FlexPen Inject 0-12 units subcutaneously three times daily with meals as per sliding scale. 2 pen 3  . Insulin Glargine (LANTUS SOLOSTAR) 100 UNIT/ML Solostar Pen Inject 30 Units into the skin daily at 10 pm. 5 pen 2  . Insulin Pen Needle 31G X 8 MM MISC Use 4 times daily with meals and at bedtime as directed. 120 each 5  . TRUEPLUS INSULIN SYRINGE 30G X 5/16" 0.5 ML MISC USE TO INJECT INSULIN DAILY AS DIRECTED BY PHYSICIAN 100 each 12   No current facility-administered medications on file prior to visit.      Review of Systems  Constitutional: Negative for activity change and appetite change.  HENT: Negative for sinus pressure and sore throat.   Eyes: Negative for visual disturbance.  Respiratory: Negative for cough, chest tightness and shortness of breath.   Cardiovascular: Negative for chest  pain and leg swelling.  Gastrointestinal: Negative for abdominal pain, diarrhea, constipation and abdominal distention.  Endocrine: Negative.   Genitourinary: Negative for dysuria.  Musculoskeletal: Negative for myalgias and joint swelling.  Skin: Negative for rash.  Allergic/Immunologic: Negative.   Neurological: Negative for weakness, light-headedness and numbness.  Psychiatric/Behavioral: Negative for suicidal ideas and dysphoric mood.       Objective: Filed Vitals:   05/09/15 1649  BP: 149/95  Pulse: 116  Temp: 98.3 F (36.8 C)  Resp: 12  Height: _0  (1.676 m)  Weight: 171 lb 3.2 oz (77.656 kg)  SpO2: 97%      Physical Exam  Constitutional: He is oriented to person, place, and time. He appears well-developed and well-nourished.    Cardiovascular: Normal heart sounds and intact distal pulses.  Tachycardia present.   No murmur heard. Pulmonary/Chest: Effort normal and breath sounds normal. He has no wheezes. He has no rales. He exhibits no tenderness.  Abdominal: Soft. Bowel sounds are normal. He exhibits no distension and no mass. There is no tenderness.  Musculoskeletal: Normal range of motion.  Neurological: He is alert and oriented to person, place, and time.          Assessment & Plan:  Type 1 diabetes mellitus: A1c has improved significantly from 10.1 in October 2016 to 8.0 today. He has been advised to continue his Lantus at 30 units at bedtime. He does have some Relion insulin and has been advised that in the event that he runs out of  Lantus (which is no longer on the pharmacy subsidy list) he could switch to his relion at 30 units twice daily and I will see him back in a month so we can discuss placing him on Levemir she is a new medication on the pharmacy subsidy list. Advised to schedule annual eye exam.  Elevated blood pressure: Blood pressure has always been within the normal range. Continue lisinopril 2.5 mg and I will reassess at his next visit for the need to increase his dose.  This note has been created with Surveyor, quantity. Any transcriptional errors are unintentional.

## 2015-06-08 ENCOUNTER — Ambulatory Visit: Payer: Self-pay | Admitting: Family Medicine

## 2016-03-21 ENCOUNTER — Encounter (HOSPITAL_COMMUNITY): Payer: Self-pay | Admitting: Emergency Medicine

## 2016-03-21 ENCOUNTER — Inpatient Hospital Stay (HOSPITAL_COMMUNITY)
Admission: EM | Admit: 2016-03-21 | Discharge: 2016-03-24 | DRG: 637 | Disposition: A | Discharge status: Home / Self Care | Payer: Self-pay | Attending: Nephrology | Admitting: Nephrology

## 2016-03-21 DIAGNOSIS — K8521 Alcohol induced acute pancreatitis with uninfected necrosis: Secondary | ICD-10-CM

## 2016-03-21 DIAGNOSIS — R1084 Generalized abdominal pain: Secondary | ICD-10-CM | POA: Diagnosis present

## 2016-03-21 DIAGNOSIS — R109 Unspecified abdominal pain: Secondary | ICD-10-CM

## 2016-03-21 DIAGNOSIS — Z794 Long term (current) use of insulin: Secondary | ICD-10-CM

## 2016-03-21 DIAGNOSIS — F17200 Nicotine dependence, unspecified, uncomplicated: Secondary | ICD-10-CM | POA: Diagnosis present

## 2016-03-21 DIAGNOSIS — R112 Nausea with vomiting, unspecified: Secondary | ICD-10-CM

## 2016-03-21 DIAGNOSIS — Z818 Family history of other mental and behavioral disorders: Secondary | ICD-10-CM

## 2016-03-21 DIAGNOSIS — F1020 Alcohol dependence, uncomplicated: Secondary | ICD-10-CM | POA: Diagnosis present

## 2016-03-21 DIAGNOSIS — Z8249 Family history of ischemic heart disease and other diseases of the circulatory system: Secondary | ICD-10-CM

## 2016-03-21 DIAGNOSIS — Z79899 Other long term (current) drug therapy: Secondary | ICD-10-CM

## 2016-03-21 DIAGNOSIS — E1010 Type 1 diabetes mellitus with ketoacidosis without coma: Principal | ICD-10-CM | POA: Diagnosis present

## 2016-03-21 DIAGNOSIS — F419 Anxiety disorder, unspecified: Secondary | ICD-10-CM | POA: Diagnosis present

## 2016-03-21 DIAGNOSIS — E876 Hypokalemia: Secondary | ICD-10-CM | POA: Diagnosis present

## 2016-03-21 DIAGNOSIS — F1010 Alcohol abuse, uncomplicated: Secondary | ICD-10-CM | POA: Diagnosis present

## 2016-03-21 DIAGNOSIS — N179 Acute kidney failure, unspecified: Secondary | ICD-10-CM | POA: Diagnosis present

## 2016-03-21 DIAGNOSIS — E860 Dehydration: Secondary | ICD-10-CM | POA: Diagnosis present

## 2016-03-21 DIAGNOSIS — E1110 Type 2 diabetes mellitus with ketoacidosis without coma: Secondary | ICD-10-CM | POA: Diagnosis present

## 2016-03-21 LAB — CBG MONITORING, ED: Glucose-Capillary: 406 mg/dL — ABNORMAL HIGH (ref 65–99)

## 2016-03-21 NOTE — ED Triage Notes (Signed)
Per EMS pt complaining of vomiting x6 hours. Pt is diabetic. CBG 420. Pt is sinus tach. Pt reported taking 10 units of insulin 2-3 hours ago. Received 4mg  zofran and 250cc NS

## 2016-03-22 ENCOUNTER — Emergency Department (HOSPITAL_COMMUNITY): Payer: Self-pay

## 2016-03-22 ENCOUNTER — Inpatient Hospital Stay (HOSPITAL_COMMUNITY): Payer: Self-pay

## 2016-03-22 ENCOUNTER — Encounter (HOSPITAL_COMMUNITY): Payer: Self-pay | Admitting: Radiology

## 2016-03-22 DIAGNOSIS — E0810 Diabetes mellitus due to underlying condition with ketoacidosis without coma: Secondary | ICD-10-CM

## 2016-03-22 DIAGNOSIS — K8521 Alcohol induced acute pancreatitis with uninfected necrosis: Secondary | ICD-10-CM

## 2016-03-22 DIAGNOSIS — F1010 Alcohol abuse, uncomplicated: Secondary | ICD-10-CM

## 2016-03-22 DIAGNOSIS — N179 Acute kidney failure, unspecified: Secondary | ICD-10-CM

## 2016-03-22 DIAGNOSIS — R1084 Generalized abdominal pain: Secondary | ICD-10-CM | POA: Diagnosis present

## 2016-03-22 DIAGNOSIS — E1010 Type 1 diabetes mellitus with ketoacidosis without coma: Principal | ICD-10-CM

## 2016-03-22 LAB — LACTATE DEHYDROGENASE: LDH: 286 U/L — ABNORMAL HIGH (ref 98–192)

## 2016-03-22 LAB — BLOOD GAS, VENOUS
ACID-BASE DEFICIT: 12.9 mmol/L — AB (ref 0.0–2.0)
BICARBONATE: 12.1 mmol/L — AB (ref 20.0–28.0)
FIO2: 21
O2 Saturation: 83.9 %
PCO2 VEN: 26.4 mmHg — AB (ref 44.0–60.0)
PH VEN: 7.282 (ref 7.250–7.430)
Patient temperature: 98.6
pO2, Ven: 52.4 mmHg — ABNORMAL HIGH (ref 32.0–45.0)

## 2016-03-22 LAB — BASIC METABOLIC PANEL
ANION GAP: 10 (ref 5–15)
ANION GAP: 14 (ref 5–15)
ANION GAP: 29 — AB (ref 5–15)
BUN: 22 mg/dL — ABNORMAL HIGH (ref 6–20)
BUN: 13 mg/dL (ref 6–20)
BUN: 10 mg/dL (ref 6–20)
CALCIUM: 8.1 mg/dL — AB (ref 8.9–10.3)
CHLORIDE: 92 mmol/L — AB (ref 101–111)
CO2: 12 mmol/L — AB (ref 22–32)
CO2: 19 mmol/L — AB (ref 22–32)
CO2: 20 mmol/L — AB (ref 22–32)
Calcium: 10 mg/dL (ref 8.9–10.3)
Calcium: 7.7 mg/dL — ABNORMAL LOW (ref 8.9–10.3)
Chloride: 103 mmol/L (ref 101–111)
Chloride: 105 mmol/L (ref 101–111)
Creatinine, Ser: 1.33 mg/dL — ABNORMAL HIGH (ref 0.61–1.24)
Creatinine, Ser: 0.93 mg/dL (ref 0.61–1.24)
Creatinine, Ser: 0.67 mg/dL (ref 0.61–1.24)
GFR calc Af Amer: >60 mL/min (ref >60)
GFR calc Af Amer: >60 mL/min (ref >60)
GFR calc non Af Amer: >60 mL/min (ref >60)
GFR calc non Af Amer: >60 mL/min (ref >60)
GFR calc non Af Amer: >60 mL/min (ref >60)
GLUCOSE: 152 mg/dL — AB (ref 65–99)
GLUCOSE: 167 mg/dL — AB (ref 65–99)
Glucose, Bld: 371 mg/dL — ABNORMAL HIGH (ref 65–99)
POTASSIUM: 3.3 mmol/L — AB (ref 3.5–5.1)
POTASSIUM: 4 mmol/L (ref 3.5–5.1)
POTASSIUM: 4.6 mmol/L (ref 3.5–5.1)
Sodium: 133 mmol/L — ABNORMAL LOW (ref 135–145)
Sodium: 136 mmol/L (ref 135–145)
Sodium: 135 mmol/L (ref 135–145)

## 2016-03-22 LAB — URINALYSIS, ROUTINE W REFLEX MICROSCOPIC
Ketones, ur: >80 mg/dL — AB
Leukocytes, UA: NEGATIVE
Nitrite: NEGATIVE
Protein, ur: 30 mg/dL — AB
pH: 5.5 (ref 5.0–8.0)

## 2016-03-22 LAB — HEPATIC FUNCTION PANEL
ALBUMIN: 5.3 g/dL — AB (ref 3.5–5.0)
ALT: 33 U/L (ref 17–63)
AST: 43 U/L — AB (ref 15–41)
Alkaline Phosphatase: 118 U/L (ref 38–126)
BILIRUBIN DIRECT: 0.3 mg/dL (ref 0.1–0.5)
BILIRUBIN TOTAL: 2.7 mg/dL — AB (ref 0.3–1.2)
Indirect Bilirubin: 2.4 mg/dL — ABNORMAL HIGH (ref 0.3–0.9)
Total Protein: 8.8 g/dL — ABNORMAL HIGH (ref 6.5–8.1)

## 2016-03-22 LAB — GLUCOSE, CAPILLARY
GLUCOSE-CAPILLARY: 150 mg/dL — AB (ref 65–99)
GLUCOSE-CAPILLARY: 162 mg/dL — AB (ref 65–99)
GLUCOSE-CAPILLARY: 148 mg/dL — AB (ref 65–99)
GLUCOSE-CAPILLARY: 154 mg/dL — AB (ref 65–99)
GLUCOSE-CAPILLARY: 202 mg/dL — AB (ref 65–99)
GLUCOSE-CAPILLARY: 155 mg/dL — AB (ref 65–99)
Glucose-Capillary: 214 mg/dL — ABNORMAL HIGH (ref 65–99)
Glucose-Capillary: 173 mg/dL — ABNORMAL HIGH (ref 65–99)
Glucose-Capillary: 191 mg/dL — ABNORMAL HIGH (ref 65–99)
Glucose-Capillary: 156 mg/dL — ABNORMAL HIGH (ref 65–99)

## 2016-03-22 LAB — CBC
HEMATOCRIT: 48.8 % (ref 39.0–52.0)
HEMOGLOBIN: 17.4 g/dL — AB (ref 13.0–17.0)
MCH: 36 pg — AB (ref 26.0–34.0)
MCHC: 35.7 g/dL (ref 30.0–36.0)
MCV: 101 fL — AB (ref 78.0–100.0)
Platelets: 234 10*3/uL (ref 150–400)
RBC: 4.83 MIL/uL (ref 4.22–5.81)
RDW: 12.5 % (ref 11.5–15.5)
WBC: 16.9 10*3/uL — ABNORMAL HIGH (ref 4.0–10.5)

## 2016-03-22 LAB — DIFFERENTIAL
BASOS ABS: 0.1 10*3/uL (ref 0.0–0.1)
Basophils Relative: 0 %
EOS ABS: 0 10*3/uL (ref 0.0–0.7)
Eosinophils Relative: 0 %
LYMPHS ABS: 0.9 10*3/uL (ref 0.7–4.0)
LYMPHS PCT: 5 %
MONOS PCT: 5 %
Monocytes Absolute: 0.9 10*3/uL (ref 0.1–1.0)
Neutro Abs: 15.2 10*3/uL — ABNORMAL HIGH (ref 1.7–7.7)
Neutrophils Relative %: 90 %

## 2016-03-22 LAB — CBG MONITORING, ED
GLUCOSE-CAPILLARY: 124 mg/dL — AB (ref 65–99)
Glucose-Capillary: 172 mg/dL — ABNORMAL HIGH (ref 65–99)
Glucose-Capillary: 150 mg/dL — ABNORMAL HIGH (ref 65–99)

## 2016-03-22 LAB — MAGNESIUM: Magnesium: 1.7 mg/dL (ref 1.7–2.4)

## 2016-03-22 LAB — URINE MICROSCOPIC-ADD ON: Bacteria, UA: NONE SEEN

## 2016-03-22 LAB — MRSA PCR SCREENING: MRSA BY PCR: NEGATIVE

## 2016-03-22 LAB — LIPASE, BLOOD: LIPASE: 62 U/L — AB (ref 11–51)

## 2016-03-22 LAB — PHOSPHORUS: Phosphorus: 2.3 mg/dL — ABNORMAL LOW (ref 2.5–4.6)

## 2016-03-22 MED ORDER — ONDANSETRON HCL 4 MG/2ML IJ SOLN
4.0000 mg | Freq: Once | INTRAMUSCULAR | Status: AC
  Administered 2016-03-22: 4 mg via INTRAVENOUS
  Filled 2016-03-22: qty 2

## 2016-03-22 MED ORDER — SODIUM CHLORIDE 0.9 % IV SOLN: Freq: Once | INTRAVENOUS | Status: DC

## 2016-03-22 MED ORDER — LORAZEPAM 1 MG PO TABS: 1.0000 mg | ORAL_TABLET | Freq: Four times a day (QID) | ORAL | Status: DC | PRN

## 2016-03-22 MED ORDER — DIPHENHYDRAMINE HCL 50 MG/ML IJ SOLN
25.0000 mg | Freq: Once | INTRAMUSCULAR | Status: AC
  Administered 2016-03-22: 25 mg via INTRAVENOUS
  Filled 2016-03-22: qty 1

## 2016-03-22 MED ORDER — INSULIN ASPART 100 UNIT/ML ~~LOC~~ SOLN
0.0000 [IU] | Freq: Three times a day (TID) | SUBCUTANEOUS | Status: DC
  Administered 2016-03-22: 5 [IU] via SUBCUTANEOUS
  Administered 2016-03-23: 8 [IU] via SUBCUTANEOUS
  Administered 2016-03-23: 2 [IU] via SUBCUTANEOUS
  Administered 2016-03-23: 8 [IU] via SUBCUTANEOUS
  Administered 2016-03-24: 5 [IU] via SUBCUTANEOUS
  Administered 2016-03-24: 8 [IU] via SUBCUTANEOUS

## 2016-03-22 MED ORDER — ENOXAPARIN SODIUM 40 MG/0.4ML ~~LOC~~ SOLN
40.0000 mg | SUBCUTANEOUS | Status: DC
Start: 2016-03-22 — End: 2016-03-24
  Administered 2016-03-22 – 2016-03-24 (×3): 40 mg via SUBCUTANEOUS
  Filled 2016-03-22 (×3): qty 0.4

## 2016-03-22 MED ORDER — VITAMIN B-1 100 MG PO TABS
100.0000 mg | ORAL_TABLET | Freq: Every day | ORAL | Status: DC
  Administered 2016-03-22 – 2016-03-24 (×3): 100 mg via ORAL
  Filled 2016-03-22 (×3): qty 1

## 2016-03-22 MED ORDER — ONDANSETRON HCL 4 MG PO TABS: 4.0000 mg | ORAL_TABLET | Freq: Four times a day (QID) | ORAL | Status: DC | PRN

## 2016-03-22 MED ORDER — SODIUM CHLORIDE 0.9 % IV SOLN
1000.0000 mL | Freq: Once | INTRAVENOUS | Status: AC
  Administered 2016-03-22: 1000 mL via INTRAVENOUS

## 2016-03-22 MED ORDER — INSULIN ASPART 100 UNIT/ML ~~LOC~~ SOLN: 0.0000 [IU] | Freq: Every day | SUBCUTANEOUS | Status: DC

## 2016-03-22 MED ORDER — LORAZEPAM 2 MG/ML IJ SOLN: 1.0000 mg | Freq: Four times a day (QID) | INTRAMUSCULAR | Status: DC | PRN

## 2016-03-22 MED ORDER — SODIUM CHLORIDE 0.9 % IV SOLN
INTRAVENOUS | Status: DC
  Filled 2016-03-22: qty 2.5

## 2016-03-22 MED ORDER — MORPHINE SULFATE (PF) 4 MG/ML IV SOLN
4.0000 mg | Freq: Once | INTRAVENOUS | Status: AC
  Administered 2016-03-22: 4 mg via INTRAVENOUS
  Filled 2016-03-22: qty 1

## 2016-03-22 MED ORDER — ACETAMINOPHEN 650 MG RE SUPP: 650.0000 mg | Freq: Four times a day (QID) | RECTAL | Status: DC | PRN

## 2016-03-22 MED ORDER — THIAMINE HCL 100 MG/ML IJ SOLN
100.0000 mg | Freq: Every day | INTRAMUSCULAR | Status: DC
  Filled 2016-03-22: qty 2

## 2016-03-22 MED ORDER — INSULIN GLARGINE 100 UNIT/ML ~~LOC~~ SOLN
12.0000 [IU] | Freq: Every day | SUBCUTANEOUS | Status: DC
  Administered 2016-03-22 – 2016-03-23 (×2): 12 [IU] via SUBCUTANEOUS
  Filled 2016-03-22 (×2): qty 0.12

## 2016-03-22 MED ORDER — METOCLOPRAMIDE HCL 5 MG/ML IJ SOLN
10.0000 mg | Freq: Once | INTRAMUSCULAR | Status: AC
  Administered 2016-03-22: 10 mg via INTRAVENOUS
  Filled 2016-03-22: qty 2

## 2016-03-22 MED ORDER — IOPAMIDOL (ISOVUE-300) INJECTION 61%
INTRAVENOUS | Status: AC
  Filled 2016-03-22: qty 100

## 2016-03-22 MED ORDER — MORPHINE SULFATE (PF) 4 MG/ML IV SOLN
4.0000 mg | INTRAVENOUS | Status: DC | PRN
  Administered 2016-03-22 (×2): 4 mg via INTRAVENOUS
  Filled 2016-03-22 (×2): qty 1

## 2016-03-22 MED ORDER — POTASSIUM CHLORIDE CRYS ER 20 MEQ PO TBCR
20.0000 meq | EXTENDED_RELEASE_TABLET | Freq: Two times a day (BID) | ORAL | Status: DC
  Administered 2016-03-22 – 2016-03-24 (×5): 20 meq via ORAL
  Filled 2016-03-22 (×5): qty 1

## 2016-03-22 MED ORDER — SODIUM CHLORIDE 0.9 % IV SOLN
1000.0000 mL | INTRAVENOUS | Status: DC
  Administered 2016-03-22: 1000 mL via INTRAVENOUS

## 2016-03-22 MED ORDER — ONDANSETRON HCL 4 MG/2ML IJ SOLN
4.0000 mg | Freq: Four times a day (QID) | INTRAMUSCULAR | Status: DC | PRN
  Administered 2016-03-22 (×2): 4 mg via INTRAVENOUS
  Filled 2016-03-22 (×2): qty 2

## 2016-03-22 MED ORDER — ADULT MULTIVITAMIN W/MINERALS CH
1.0000 | ORAL_TABLET | Freq: Every day | ORAL | Status: DC
  Administered 2016-03-22 – 2016-03-24 (×3): 1 via ORAL
  Filled 2016-03-22 (×3): qty 1

## 2016-03-22 MED ORDER — SODIUM CHLORIDE 0.9% FLUSH
3.0000 mL | Freq: Two times a day (BID) | INTRAVENOUS | Status: DC
  Administered 2016-03-22 – 2016-03-24 (×4): 3 mL via INTRAVENOUS

## 2016-03-22 MED ORDER — ACETAMINOPHEN 325 MG PO TABS: 650.0000 mg | ORAL_TABLET | Freq: Four times a day (QID) | ORAL | Status: DC | PRN

## 2016-03-22 MED ORDER — SODIUM CHLORIDE 0.9 % IV BOLUS (SEPSIS)
1000.0000 mL | Freq: Once | INTRAVENOUS | Status: AC
  Administered 2016-03-22: 1000 mL via INTRAVENOUS

## 2016-03-22 MED ORDER — IOPAMIDOL (ISOVUE-300) INJECTION 61%
100.0000 mL | Freq: Once | INTRAVENOUS | Status: AC | PRN
  Administered 2016-03-22: 100 mL via INTRAVENOUS

## 2016-03-22 MED ORDER — FOLIC ACID 1 MG PO TABS
1.0000 mg | ORAL_TABLET | Freq: Every day | ORAL | Status: DC
Start: 2016-03-22 — End: 2016-03-24
  Administered 2016-03-22 – 2016-03-24 (×3): 1 mg via ORAL
  Filled 2016-03-22 (×3): qty 1

## 2016-03-22 MED ORDER — DEXTROSE-NACL 5-0.45 % IV SOLN
INTRAVENOUS | Status: DC
  Administered 2016-03-22: 03:00:00 via INTRAVENOUS

## 2016-03-22 MED ORDER — SODIUM CHLORIDE 0.9 % IJ SOLN
INTRAMUSCULAR | Status: AC
  Filled 2016-03-22: qty 50

## 2016-03-22 MED ORDER — INSULIN ASPART 100 UNIT/ML ~~LOC~~ SOLN: 0.0000 [IU] | SUBCUTANEOUS | Status: DC

## 2016-03-22 MED ORDER — SODIUM CHLORIDE 0.9 % IV SOLN
INTRAVENOUS | Status: DC
  Administered 2016-03-23 – 2016-03-24 (×3): via INTRAVENOUS

## 2016-03-22 MED ORDER — MAGNESIUM SULFATE 2 GM/50ML IV SOLN
2.0000 g | Freq: Once | INTRAVENOUS | Status: AC
  Administered 2016-03-22: 2 g via INTRAVENOUS
  Filled 2016-03-22: qty 50

## 2016-03-22 MED ORDER — DEXTROSE-NACL 5-0.45 % IV SOLN
INTRAVENOUS | Status: DC
  Administered 2016-03-22: 1000 mL via INTRAVENOUS
  Administered 2016-03-22: 07:00:00 via INTRAVENOUS

## 2016-03-22 NOTE — H&P (Signed)
History and Physical  Patient Name: Christopher Robles     MCN:[id number]    DOB: 1991-02-19    DOA: 03/21/2016 PCP: Lorayne Marek, MD   Patient coming from: Home  Chief Complaint: Vomiting, abdominal pain  HPI: Christopher Robles is a 25 y.o. male with a past medical history significant for T1DM and alcoholism who presents with 1 day vomiting.  The patient was in his normal health until today, when he woke up vomiting and with severe, generalized, constant abdominal pain and malaise. The vomiting persisted all day, until the evening when he checked his blood sugar was over 400, and he give himself 10 units of insulin. Afterwards he felt no better, so he came to the ER. The patient drinks a quart of gin or other spirits per day plus two 40 ounce beers, and has for the last 5 years.  ED course: -Temp 99.74F, heart rate 106, respirations 25, pulse oximetry normal, blood pressure 134/95 -Na 133, K 4.6, Cr 1.33 (baseline 0.76), WBC 16.9K, Hgb 17.4 -Bicarbonate 12, anion gap 29 -He was given 1 L normal saline and started on insulin drip and TRH was asked to evaluate for DKA        ROS: Review of Systems  Constitutional: Positive for malaise/fatigue. Negative for chills and fever.  Gastrointestinal: Positive for abdominal pain, nausea and vomiting.  Musculoskeletal: Positive for myalgias.  All other systems reviewed and are negative.         Past Medical History:  Diagnosis Date  . Anxiety   . DM I (diabetes mellitus, type I), uncontrolled ([place])   . ETOH abuse   . Seizure [facility])     Past Surgical History:  Procedure Laterality Date  . APPENDECTOMY      Social History: Patient lives with his brother.  The patient walks unassisted.  He smokes.  He is from [place] originally.    No Known Allergies  Family history: family history includes Anxiety disorder in his mother; Bipolar disorder in his brother; Cancer in his maternal aunt and maternal uncle; Heart disease in his paternal  grandfather.  Prior to Admission medications   Medication Sig Start Date End Date Taking? Authorizing Provider  insulin NPH-regular Human (NOVOLIN 70/30) (70-30) 100 UNIT/ML injection Inject 30 Units into the skin 2 (two) times daily.   Yes Historical Provider, MD  blood glucose meter kit and supplies Dispense based on patient and insurance preference. Use up to four times daily as directed. (FOR ICD-9 250.00, 250.01). 11/24/14   Janece Canterbury, MD  Insulin Pen Needle 31G X 8 MM MISC Use 4 times daily with meals and at bedtime as directed. 03/08/15   Arnoldo Morale, MD  TRUEPLUS INSULIN SYRINGE 30G X 5/16" 0.5 ML MISC USE TO INJECT INSULIN DAILY AS DIRECTED BY PHYSICIAN 04/03/15   Arnoldo Morale, MD       Physical Exam: BP 138/85   Pulse (!) 121   Temp 99.4 F (37.4 C) (Oral)   Resp 22   Ht _0  (1.676 m)   Wt 77.6 kg (171 lb)   SpO2 100%   BMI 27.60 kg/m  General appearance: Well-developed, adult male, alert and in severe distress from pain and nausea, shivering.   Eyes: Anicteric, conjunctiva pink, lids and lashes normal. PERRL.    ENT: No nasal deformity, discharge, epistaxis.  Hearing normal. OP dry without lesions.   Neck: No neck masses.  Trachea midline.  No thyromegaly/tenderness. Lymph: No cervical or supraclavicular lymphadenopathy. Skin: Warm and  dry.  No jaundice. Diffuse redness.  Some eczema/psoriasis like changes around hairline. Cardiac: Tachycardic, regular, nl S1-S2, no murmurs appreciated.  Capillary refill is brisk.  No LE edema.  Radial and DP pulses 2+ and symmetric. Respiratory: Increased respiratory rate.  CTAB without rales or wheezes. Abdomen: Abdomen soft.  Diffuse nonfocal TTP with guarding.  No rebound. No ascites, distension, hepatosplenomegaly.   MSK: No deformities or effusions.  No cyanosis or clubbing. Neuro: Cranial nerves grossly normal.  Sensation intact to light touch. Speech is fluent.  Muscle strength normal.    Psych: Sensorium intact and  responding to questions, oriented, attention normal.  Behavior appropriate.  Affect altered by pain.  Judgment and insight appear normal.     Labs on Admission:  I have personally reviewed following labs and imaging studies: CBC:  Recent Labs Lab 03/22/16 0014  WBC 16.9*  NEUTROABS 15.2*  HGB 17.4*  HCT 48.8  MCV 101.0*  PLT 938   Basic Metabolic Panel:  Recent Labs Lab 03/22/16 0014  NA 133*  K 4.6  CL 92*  CO2 12*  GLUCOSE 371*  BUN 22*  CREATININE 1.33*  CALCIUM 10.0   GFR: Estimated Creatinine Clearance: 82.5 mL/min (by C-G formula based on SCr of 1.33 mg/dL (H)).  Liver Function Tests:  Recent Labs Lab 03/22/16 0014  AST 43*  ALT 33  ALKPHOS 118  BILITOT 2.7*  PROT 8.8*  ALBUMIN 5.3*   No results for input(s): LIPASE, AMYLASE in the last 168 hours. No results for input(s): AMMONIA in the last 168 hours. Coagulation Profile: No results for input(s): INR, PROTIME in the last 168 hours. Cardiac Enzymes: No results for input(s): CKTOTAL, CKMB, CKMBINDEX, TROPONINI in the last 168 hours. BNP (last 3 results) No results for input(s): PROBNP in the last 8760 hours. HbA1C: No results for input(s): HGBA1C in the last 72 hours. CBG:  Recent Labs Lab 03/21/16 2338 03/22/16 0210  GLUCAP 406* 172*   Lipid Profile: No results for input(s): CHOL, HDL, LDLCALC, TRIG, CHOLHDL, LDLDIRECT in the last 72 hours. Thyroid Function Tests: No results for input(s): TSH, T4TOTAL, FREET4, T3FREE, THYROIDAB in the last 72 hours. Anemia Panel: No results for input(s): VITAMINB12, FOLATE, FERRITIN, TIBC, IRON, RETICCTPCT in the last 72 hours. Sepsis Labs: Invalid input(s): PROCALCITONIN, LACTICIDVEN No results found for this or any previous visit (from the past 240 hour(s)).       Radiological Exams on Admission: Personally reviewed Abd x-ray report: Dg Abd Acute W/chest  Result Date: 03/22/2016 CLINICAL DATA:  25 y/o  M; 6 hours of vomiting. EXAM: DG ABDOMEN  ACUTE W/ 1V CHEST COMPARISON:  11/19/2014 chest and abdomen radiographs. FINDINGS: There is no evidence of dilated bowel loops or free intraperitoneal air. No radiopaque calculi or other significant radiographic abnormality is seen. Heart size and mediastinal contours are within normal limits. Both lungs are clear. IMPRESSION: Negative abdominal radiographs.  No acute cardiopulmonary disease. Electronically Signed   By: Kristine Garbe M.D.   On: 03/22/2016 00:57        Assessment/Plan  1. DKA:    -Insulin gtt -More IVF -Admit to stepdown -Check mag and phos and replete as necessary -Check BMP q4hrs    2. Elevated creatinine:  Dehydration in setting of DKA. -Fluids and trend BMP  3. Abdominal pain:  Suspect pain from DKA.  Pancreatitis within differential, especially given alcohol.  LFTs mild AST elevation, indirect hyperbilirubinemia. -Check lipase -Obtain CT abdomen -Morphine IV for now, ondansetron for nausea  4.  Alcohol dependence:  -Folate, Thiamine and MVI -CIWA protocol           DVT prophylaxis: Lovenox  Code Status: FULL  Family Communication: None present Disposition Plan: Anticipate IV fluids and insulin drip and close monitoring of hemodynamics and electrolytes with supplementation as necessary.  CT abdomen and rule out pancreatitis. Consults called: None Admission status: INPATIENT, stepdown         Medical decision making: Patient seen at 2:43 AM on 03/22/2016.  The patient was discussed with Dr. Roxanne Mins.  What exists of the patient's chart was reviewed in depth and summarized above.  Clinical condition: tachycardic and uncomfortable and receiving ongoing fluid resuscitation and still in considerable pain, CT pending.        Edwin Dada [facility] Pager [phone]

## 2016-03-22 NOTE — ED Notes (Signed)
Educated pt on importance of keeping left arm straight to allow fluids to infuse. Pt verbalized understanding.

## 2016-03-22 NOTE — Progress Notes (Signed)
PROGRESS NOTE                                                                                                                                                                                                             Patient Demographics:    Christopher Robles, is a 25 y.o. male, DOB - 07/24/1990, RUE:[id number]  Admit date - 03/21/2016   Admitting Physician Alberteen Samhristopher P Danford, MD  Outpatient Primary MD for the patient is Doris Cheadleeepak Advani, MD  LOS - 0  Outpatient Specialists:None  Chief Complaint  Patient presents with  . Hyperglycemia       Brief Narrative   25 year old male with history of type 1 diabetes mellitus and alcoholism presented with 1 day of epigastric pain with vomiting. He was found to have DKA and acute alcoholic pancreatitis.   Subjective:   Patient informs his abdominal pain to be much better. Denies nausea or vomiting.   Assessment  & Plan :    Principal Problem:   DKA (diabetic ketoacidoses) (HCC) Placed on aggressive IV hydration and insulin drip. Anion gap closed. Will order 12 units of Lantus with sliding scale coverage. Discontinue drip after 2 hours. Vitals stable. Patient was [facility] at [facility] until one year ago but [facility] and treat on his own. He informs that his sugars are usually in the range of 300s-400s and sometimes unreadable. Also reports some tingling in his legs. -Patient instructed that he needs to follow-up at the wellness Center for close outpatient monitoring and adjust his medications.  Active Problems: Acute alcoholic pancreatitis Patient drinks heavily of about a 5th of mixed liquor along with beer. Mildly elevated lipase with abdominal CT showing pancreatitis with small area of necrosis. Continue aggressive hydration and pain medications. Antiemetic as noted. Symptoms much improved this morning. I'll start him on clear liquids.  If worsens will need GI evaluation.    AKI (acute kidney injury) (HCC) Prerenal secondary to dehydration. Resolved with hydration.  Alcohol abuse Counseled strongly on cessation. Monitor on CIWA. Counseled strongly on cessation. Continue thiamine, folate and multivitamin. Pain control with when necessary morphine.   Hypokalemia/hypomagnesemia Replenished  Code Status : Full code  Family Communication  : None at bedside  Disposition Plan  : Transfer to telemetry. Home once improved  Barriers For Discharge : Active  symptoms  Consults  :  None  Procedures  : CT abdomen  DVT Prophylaxis  :  Lovenox -  Lab Results  Component Value Date   PLT 234 03/22/2016    Antibiotics  :   Anti-infectives    None        Objective:   Vitals:   03/22/16 0515 03/22/16 0530 03/22/16 0600 03/22/16 0800  BP: 128/81 129/80 120/72   Pulse:    (!) 108  Resp: 19 18 26  (!) 22  Temp:   98.5 F (36.9 C) 97.2 F (36.2 C)  TempSrc:   Oral Oral  SpO2: 99% 98% 99% 98%  Weight:      Height:        Wt Readings from Last 3 Encounters:  03/21/16 77.6 kg (171 lb)  05/09/15 77.7 kg (171 lb 3.2 oz)  03/27/15 73.8 kg (162 lb 9.6 oz)     Intake/Output Summary (Last 24 hours) at 03/22/16 1137 Last data filed at 03/22/16 0700  Gross per 24 hour  Intake             1000 ml  Output              600 ml  Net              400 ml     Physical Exam  Gen: not in distress HEENT:  moist mucosa, supple neck Chest: clear b/l, no added sounds CVS: S1 and S2 tachycardic, no murmurs GI: soft, NT, ND, BS+ Musculoskeletal: warm, no edema CNS: Alert and oriented, no tremors    Data Review:    CBC  Recent Labs Lab 03/22/16 0014  WBC 16.9*  HGB 17.4*  HCT 48.8  PLT 234  MCV 101.0*  MCH 36.0*  MCHC 35.7  RDW 12.5  LYMPHSABS 0.9  MONOABS 0.9  EOSABS 0.0  BASOSABS 0.1    Chemistries   Recent Labs Lab 03/22/16 0014 03/22/16 0624 03/22/16 1000  NA 133* 136 135  K 4.6 4.0 3.3*   CL 92* 103 105  CO2 12* 19* 20*  GLUCOSE 371* 152* 167*  BUN 22* 13 10  CREATININE 1.33* 0.93 0.67  CALCIUM 10.0 8.1* 7.7*  MG  --  1.7  --   AST 43*  --   --   ALT 33  --   --   ALKPHOS 118  --   --   BILITOT 2.7*  --   --    ------------------------------------------------------------------------------------------------------------------ No results for input(s): CHOL, HDL, LDLCALC, TRIG, CHOLHDL, LDLDIRECT in the last 72 hours.  Lab Results  Component Value Date   HGBA1C 8.0 05/09/2015   ------------------------------------------------------------------------------------------------------------------ No results for input(s): TSH, T4TOTAL, T3FREE, THYROIDAB in the last 72 hours.  Invalid input(s): FREET3 ------------------------------------------------------------------------------------------------------------------ No results for input(s): VITAMINB12, FOLATE, FERRITIN, TIBC, IRON, RETICCTPCT in the last 72 hours.  Coagulation profile No results for input(s): INR, PROTIME in the last 168 hours.  No results for input(s): DDIMER in the last 72 hours.  Cardiac Enzymes No results for input(s): CKMB, TROPONINI, MYOGLOBIN in the last 168 hours.  Invalid input(s): CK ------------------------------------------------------------------------------------------------------------------ No results found for: BNP  Inpatient Medications  Scheduled Meds: . enoxaparin (LOVENOX) injection  40 mg Subcutaneous Q24H  . folic acid  1 mg Oral Daily  . insulin aspart  0-15 Units Subcutaneous TID WC  . insulin aspart  0-5 Units Subcutaneous QHS  . insulin glargine  12 Units Subcutaneous Daily  . iopamidol      .  multivitamin with minerals  1 tablet Oral Daily  . potassium chloride  20 mEq Oral BID  . sodium chloride      . sodium chloride flush  3 mL Intravenous Q12H  . thiamine  100 mg Oral Daily   Or  . thiamine  100 mg Intravenous Daily   Continuous Infusions: . sodium chloride     . dextrose 5 % and 0.45% NaCl 1,000 mL (03/22/16 0900)  . insulin (NOVOLIN-R) infusion 0.5 Units/hr (03/22/16 0626)   PRN Meds:.acetaminophen **OR** acetaminophen, LORazepam **OR** LORazepam, morphine injection, ondansetron **OR** ondansetron (ZOFRAN) IV  Micro Results Recent Results (from the past 240 hour(s))  MRSA PCR Screening     Status: None   Collection Time: 03/22/16  7:33 AM  Result Value Ref Range Status   MRSA by PCR NEGATIVE NEGATIVE Final    Comment:        The GeneXpert MRSA Assay (FDA approved for NASAL specimens only), is one component of a comprehensive MRSA colonization surveillance program. It is not intended to diagnose MRSA infection nor to guide or monitor treatment for MRSA infections.     Radiology Reports Ct Abdomen Pelvis W Contrast  Result Date: 03/22/2016 CLINICAL DATA:  25 y/o M; episodic of vomiting with abdominal pain and nausea. EXAM: CT ABDOMEN AND PELVIS WITH CONTRAST TECHNIQUE: Multidetector CT imaging of the abdomen and pelvis was performed using the standard protocol following bolus administration of intravenous contrast. CONTRAST:  ISOVUE-300 IOPAMIDOL (ISOVUE-300) INJECTION 61% COMPARISON:  11/20/2014 CT of abdomen and pelvis FINDINGS: Lower chest: No acute abnormality. Hepatobiliary: No focal liver abnormality is seen. No gallstones, gallbladder wall thickening, or biliary dilatation. Pancreas: Diffuse edema surrounding the pancreas compatible pancreatitis. Focus of hypoattenuation within pancreatic body (less than 25%) may represent necrosis. No peripancreatic collection identified. Spleen: Normal in size without focal abnormality. Adrenals/Urinary Tract: Adrenal glands are unremarkable. Kidneys are normal, without renal calculi, focal lesion, or hydronephrosis. Marked distention of the bladder. Stomach/Bowel: Stomach is within normal limits. Appendix not identify. No evidence of bowel wall thickening, distention, or inflammatory changes.  Vascular/Lymphatic: No significant vascular findings are present. No enlarged abdominal or pelvic lymph nodes. Reproductive: Prostate is unremarkable. Other: No abdominal wall hernia or abnormality. No abdominopelvic ascites. Musculoskeletal: No acute or significant osseous findings. IMPRESSION: Diffuse edema surrounding the pancreas compatible with acute pancreatitis. Focus of hypoattenuation within pancreatic body (less than 25%) may represent necrosis. No peripancreatic collection identified. Electronically Signed   By: Mitzi Hansen M.D.   On: 03/22/2016 03:28   Dg Abd Acute W/chest  Result Date: 03/22/2016 CLINICAL DATA:  25 y/o  M; 6 hours of vomiting. EXAM: DG ABDOMEN ACUTE W/ 1V CHEST COMPARISON:  11/19/2014 chest and abdomen radiographs. FINDINGS: There is no evidence of dilated bowel loops or free intraperitoneal air. No radiopaque calculi or other significant radiographic abnormality is seen. Heart size and mediastinal contours are within normal limits. Both lungs are clear. IMPRESSION: Negative abdominal radiographs.  No acute cardiopulmonary disease. Electronically Signed   By: Mitzi Hansen M.D.   On: 03/22/2016 00:57    Time Spent in minutes  20   Eddie North M.D on 03/22/2016 at 11:37 AM  Between 7am to 7pm - Pager - [phone]  After 7pm go to [email or web address] - password [email or web address]  [facility] -  Office  [phone]

## 2016-03-22 NOTE — ED Provider Notes (Signed)
[place] DEPT Provider Note   CSN: [id number] Arrival date & time: 03/21/16  2331 By signing my name below, I, Georgette Shell, attest that this documentation has been prepared under the direction and in the presence of Delora Fuel, MD. Electronically Signed: Georgette Shell, ED Scribe. 03/22/16. 12:10 AM.  History   Chief Complaint Chief Complaint  Patient presents with  . Hyperglycemia   HPI Comments: Christopher Robles is a 25 y.o. male with h/o anxiety, DM, seizures, and alcohol abuse who presents to the Emergency Department complaining of episodic vomiting onset 6 hours ago with associated 10/10 abdominal pain and nausea. Pain is exacerbated with movement and positional changes. Pt notes that his CBG was around 420 today; he took 10 units of insulin 3 hours ago with mild relief. Pt has h/o DKA and states he did not have abdominal pain with his episode. Pt denies chills, diaphoresis, constipation, diarrhea, fever, or any other associated symptoms.   The history is provided by the patient. No language interpreter was used.    Past Medical History:  Diagnosis Date  . Anxiety   . DM I (diabetes mellitus, type I), uncontrolled ([place])   . ETOH abuse   . Seizure [facility])     Patient Active Problem List   Diagnosis Date Noted  . Hypertriglyceridemia 03/28/2015  . Anxiety 03/27/2015  . ETOH abuse 03/27/2015  . Diabetic ketoacidosis without coma ([place])   . Leukocytosis 02/13/2015  . Hypokalemia 02/13/2015  . DM (diabetes mellitus) type 1, uncontrolled, with ketoacidosis ([place]) 02/13/2015  . Hyperkalemia 02/13/2015  . High anion gap metabolic acidosis 26/37/8588  . Nausea and vomiting 02/12/2015  . Acute hyponatremia 02/12/2015  . DKA (diabetic ketoacidoses) ([place]) 11/18/2014  . Noncompliance with diabetes treatment 12/19/2013    Past Surgical History:  Procedure Laterality Date  . APPENDECTOMY         Home Medications    Prior to Admission medications   Medication Sig Start Date End  Date Taking? Authorizing Provider  acetaminophen (TYLENOL) 500 MG tablet Take 1,000-2,000 mg by mouth every 6 (six) hours as needed (for pain.).     Historical Provider, MD  blood glucose meter kit and supplies Dispense based on patient and insurance preference. Use up to four times daily as directed. (FOR ICD-9 250.00, 250.01). 11/24/14   Janece Canterbury, MD  gemfibrozil (LOPID) 600 MG tablet Take 1 tablet (600 mg total) by mouth 2 (two) times daily before a meal. 03/28/15   Arnoldo Morale, MD  insulin aspart (NOVOLOG FLEXPEN) 100 UNIT/ML FlexPen Inject 0-12 units subcutaneously three times daily with meals as per sliding scale. 03/27/15   Arnoldo Morale, MD  Insulin Glargine (LANTUS SOLOSTAR) 100 UNIT/ML Solostar Pen Inject 30 Units into the skin daily at 10 pm. 03/27/15   Arnoldo Morale, MD  Insulin Pen Needle 31G X 8 MM MISC Use 4 times daily with meals and at bedtime as directed. 03/08/15   Arnoldo Morale, MD  lisinopril (PRINIVIL,ZESTRIL) 2.5 MG tablet Take 1 tablet (2.5 mg total) by mouth daily. 05/09/15   Arnoldo Morale, MD  TRUEPLUS INSULIN SYRINGE 30G X 5/16" 0.5 ML MISC USE TO INJECT INSULIN DAILY AS DIRECTED BY PHYSICIAN 04/03/15   Arnoldo Morale, MD    Family History Family History  Problem Relation Age of Onset  . Cancer Maternal Aunt   . Cancer Maternal Uncle   . Heart disease Paternal Grandfather     Social History Social History  Substance Use Topics  . Smoking status: Light Tobacco  Smoker  . Smokeless tobacco: Never Used  . Alcohol use 24.0 oz/week    40 Cans of beer per week     Comment: unspecified amount but drinks beer/liquor daily     Allergies   Patient has no known allergies.   Review of Systems Review of Systems  Constitutional: Negative for chills, diaphoresis and fever.  Gastrointestinal: Positive for abdominal pain, nausea and vomiting.  All other systems reviewed and are negative.    Physical Exam Updated Vital Signs BP 134/95 (BP Location: Left Arm)    Pulse 106   Temp 99.4 F (37.4 C) (Oral)   Resp 25   Ht 5' 6" (1.676 m)   Wt 171 lb (77.6 kg)   SpO2 100%   BMI 27.60 kg/m   Physical Exam  Constitutional: He is oriented to person, place, and time. He appears well-developed and well-nourished.  Appears uncomfortable.  HENT:  Head: Normocephalic and atraumatic.  Eyes: EOM are normal. Pupils are equal, round, and reactive to light.  Neck: Normal range of motion. Neck supple. No JVD present.  Cardiovascular: Normal rate, regular rhythm and normal heart sounds.   No murmur heard. Pulmonary/Chest: Effort normal and breath sounds normal. He has no wheezes. He has no rales. He exhibits no tenderness.  Abdominal: Soft. He exhibits no distension and no mass. Bowel sounds are decreased. There is tenderness. There is no rebound and no guarding.  Mild tenderness diffusely.  Musculoskeletal: Normal range of motion. He exhibits no edema.  Lymphadenopathy:    He has no cervical adenopathy.  Neurological: He is alert and oriented to person, place, and time. No cranial nerve deficit. He exhibits normal muscle tone. Coordination normal.  Skin: Skin is warm and dry. No rash noted.  Psychiatric: He has a normal mood and affect. His behavior is normal. Judgment and thought content normal.  Nursing note and vitals reviewed.    ED Treatments / Results  DIAGNOSTIC STUDIES: Oxygen Saturation is 100% on RA, normal by my interpretation.    COORDINATION OF CARE: 12:10 AM Discussed treatment plan with pt at bedside which includes lab work and pt agreed to plan.  Labs (all labs ordered are listed, but only abnormal results are displayed) Labs Reviewed  BASIC METABOLIC PANEL - Abnormal; Notable for the following:       Result Value   Sodium 133 (*)    Chloride 92 (*)    CO2 12 (*)    Glucose, Bld 371 (*)    BUN 22 (*)    Creatinine, Ser 1.33 (*)    Anion gap 29 (*)    All other components within normal limits  CBC - Abnormal; Notable for the  following:    WBC 16.9 (*)    Hemoglobin 17.4 (*)    MCV 101.0 (*)    MCH 36.0 (*)    All other components within normal limits  HEPATIC FUNCTION PANEL - Abnormal; Notable for the following:    Total Protein 8.8 (*)    Albumin 5.3 (*)    AST 43 (*)    Total Bilirubin 2.7 (*)    Indirect Bilirubin 2.4 (*)    All other components within normal limits  BLOOD GAS, VENOUS - Abnormal; Notable for the following:    pCO2, Ven 26.4 (*)    pO2, Ven 52.4 (*)    Bicarbonate 12.1 (*)    Acid-base deficit 12.9 (*)    All other components within normal limits  DIFFERENTIAL - Abnormal; Notable for  the following:    Neutro Abs 15.2 (*)    All other components within normal limits  CBG MONITORING, ED - Abnormal; Notable for the following:    Glucose-Capillary 406 (*)    All other components within normal limits  CBG MONITORING, ED - Abnormal; Notable for the following:    Glucose-Capillary 172 (*)    All other components within normal limits  URINALYSIS, ROUTINE W REFLEX MICROSCOPIC (NOT AT [facility])  LIPASE, BLOOD    Radiology Dg Abd Acute W/chest  Result Date: 03/22/2016 CLINICAL DATA:  25 y/o  M; 6 hours of vomiting. EXAM: DG ABDOMEN ACUTE W/ 1V CHEST COMPARISON:  11/19/2014 chest and abdomen radiographs. FINDINGS: There is no evidence of dilated bowel loops or free intraperitoneal air. No radiopaque calculi or other significant radiographic abnormality is seen. Heart size and mediastinal contours are within normal limits. Both lungs are clear. IMPRESSION: Negative abdominal radiographs.  No acute cardiopulmonary disease. Electronically Signed   By: Kristine Garbe M.D.   On: 03/22/2016 00:57    Procedures Procedures (including critical care time) CRITICAL CARE Performed by: [email or web address] Total critical care time: 60 minutes Critical care time was exclusive of separately billable procedures and treating other patients. Critical care was necessary to treat or prevent imminent or  life-threatening deterioration. Critical care was time spent personally by me on the following activities: development of treatment plan with patient and/or surrogate as well as nursing, discussions with consultants, evaluation of patient's response to treatment, examination of patient, obtaining history from patient or surrogate, ordering and performing treatments and interventions, ordering and review of laboratory studies, ordering and review of radiographic studies, pulse oximetry and re-evaluation of patient's condition.  Medications Ordered in ED Medications  dextrose 5 %-0.45 % sodium chloride infusion (not administered)  insulin regular (NOVOLIN R,HUMULIN R) 250 Units in sodium chloride 0.9 % 250 mL (1 Units/mL) infusion (not administered)  metoCLOPramide (REGLAN) injection 10 mg (not administered)  diphenhydrAMINE (BENADRYL) injection 25 mg (not administered)  morphine 4 MG/ML injection 4 mg (not administered)  sodium chloride 0.9 % bolus 1,000 mL (not administered)  0.9 %  sodium chloride infusion (not administered)  ondansetron (ZOFRAN) injection 4 mg (4 mg Intravenous Given 03/22/16 0006)  morphine 4 MG/ML injection 4 mg (4 mg Intravenous Given 03/22/16 0014)  0.9 %  sodium chloride infusion (0 mLs Intravenous Stopped 03/22/16 0141)     Initial Impression / Assessment and Plan / ED Course  I have reviewed the triage vital signs and the nursing notes.  Pertinent labs & imaging results that were available during my care of the patient were reviewed by me and considered in my medical decision making (see chart for details).  Clinical Course    Nausea and vomiting with abdominal pain. Patient is diabetic and review of old records shows prior admissions for ketoacidosis. I am concerned that this may actually represent recurrence of ketoacidosis. He is given IV fluids and given morphine for pain and ondansetron for nausea. He is given a liter of fluid and initially. CBG is only  moderately elevated at about 400. He sent for abdominal x-rays to look for evidence of any serious pathology given sudden onset of abdominal pain. Abdominal x-rays are unremarkable. Laboratory workup does show ketoacidosis. Slight rise in BUN and creatinine consistent with dehydration. He did not get adequate relief of nausea or pain. He is given metoclopramide and additional morphine. He started on glucose stabilizer for his ketoacidosis. CT of abdomen and pelvis has been ordered.  Case is discussed with Dr. Loleta Books [facility] who agrees to admit the patient.  Final Clinical Impressions(s) / ED Diagnoses   Final diagnoses:  Diabetic ketoacidosis without coma associated with type 1 diabetes mellitus (HCC)  Intractable vomiting with nausea, unspecified vomiting type  Abdominal pain, unspecified abdominal location  Acute kidney injury (nontraumatic) (HCC)    New Prescriptions New Prescriptions   No medications on file   I personally performed the services described in this documentation, which was scribed in my presence. The recorded information has been reviewed and is accurate.       Delora Fuel, MD 02/72/53 [id number]

## 2016-03-22 NOTE — ED Notes (Signed)
Patient transported to CT 

## 2016-03-22 NOTE — ED Notes (Signed)
Patient CBG = 124.  Dr Preston FleetingGlick notified.  Ordered to continue insulin at 1.1 Units per hour and to increase D5 1/2NS to 200mL per hour

## 2016-03-22 NOTE — ED Notes (Signed)
Nurse will draw labs with IV start. 

## 2016-03-22 NOTE — ED Notes (Signed)
Patient returned from CT

## 2016-03-23 LAB — COMPREHENSIVE METABOLIC PANEL
ALK PHOS: 78 U/L (ref 38–126)
ALT: 21 U/L (ref 17–63)
AST: 24 U/L (ref 15–41)
Albumin: 3.9 g/dL (ref 3.5–5.0)
Anion gap: 15 (ref 5–15)
BILIRUBIN TOTAL: 1.8 mg/dL — AB (ref 0.3–1.2)
BUN: 7 mg/dL (ref 6–20)
CALCIUM: 8.2 mg/dL — AB (ref 8.9–10.3)
CO2: 18 mmol/L — ABNORMAL LOW (ref 22–32)
CREATININE: 0.87 mg/dL (ref 0.61–1.24)
Chloride: 99 mmol/L — ABNORMAL LOW (ref 101–111)
GFR calc Af Amer: >60 mL/min (ref >60)
Glucose, Bld: 290 mg/dL — ABNORMAL HIGH (ref 65–99)
POTASSIUM: 3.6 mmol/L (ref 3.5–5.1)
Sodium: 132 mmol/L — ABNORMAL LOW (ref 135–145)
TOTAL PROTEIN: 6.7 g/dL (ref 6.5–8.1)

## 2016-03-23 LAB — CBC
HEMATOCRIT: 43.3 % (ref 39.0–52.0)
HEMOGLOBIN: 15.2 g/dL (ref 13.0–17.0)
MCH: 35.3 pg — ABNORMAL HIGH (ref 26.0–34.0)
MCHC: 35.1 g/dL (ref 30.0–36.0)
MCV: 100.7 fL — AB (ref 78.0–100.0)
Platelets: 172 10*3/uL (ref 150–400)
RBC: 4.3 MIL/uL (ref 4.22–5.81)
RDW: 12.4 % (ref 11.5–15.5)
WBC: 9.2 10*3/uL (ref 4.0–10.5)

## 2016-03-23 LAB — GLUCOSE, CAPILLARY
GLUCOSE-CAPILLARY: 127 mg/dL — AB (ref 65–99)
GLUCOSE-CAPILLARY: 232 mg/dL — AB (ref 65–99)
GLUCOSE-CAPILLARY: 244 mg/dL — AB (ref 65–99)
GLUCOSE-CAPILLARY: 271 mg/dL — AB (ref 65–99)
Glucose-Capillary: 287 mg/dL — ABNORMAL HIGH (ref 65–99)
Glucose-Capillary: 272 mg/dL — ABNORMAL HIGH (ref 65–99)
Glucose-Capillary: 158 mg/dL — ABNORMAL HIGH (ref 65–99)

## 2016-03-23 LAB — LIPASE, BLOOD: LIPASE: 101 U/L — AB (ref 11–51)

## 2016-03-23 MED ORDER — INSULIN ASPART 100 UNIT/ML ~~LOC~~ SOLN
4.0000 [IU] | Freq: Three times a day (TID) | SUBCUTANEOUS | Status: DC
  Administered 2016-03-23 – 2016-03-24 (×4): 4 [IU] via SUBCUTANEOUS

## 2016-03-23 MED ORDER — INSULIN GLARGINE 100 UNIT/ML ~~LOC~~ SOLN
20.0000 [IU] | Freq: Every day | SUBCUTANEOUS | Status: DC
  Administered 2016-03-24: 20 [IU] via SUBCUTANEOUS
  Filled 2016-03-23: qty 0.2

## 2016-03-23 NOTE — Progress Notes (Signed)
PROGRESS NOTE                                                                                                                                                                                                             Patient Demographics:    Christopher Robles, is a 25 y.o. male, DOB - 1991/03/30, QMV:[id number]  Admit date - 03/21/2016   Admitting Physician Alberteen Samhristopher P Danford, MD  Outpatient Primary MD for the patient is Doris Cheadleeepak Advani, MD  LOS - 1  Outpatient Specialists:None  Chief Complaint  Patient presents with  . Hyperglycemia       Brief Narrative   25 year old male with history of type 1 diabetes mellitus and alcoholism presented with 1 day of epigastric pain with vomiting. He was found to have DKA and acute alcoholic pancreatitis.   Subjective:   Epigastric pain much better. Tolerating clears.    Assessment  & Plan :    Principal Problem:   DKA (diabetic ketoacidoses) (HCC) Resolved with aggressive IV hydration and insulin drip. Now on Lantus with sliding scale coverage. Will increase dose of Lantus and add pre-meal coverage given his CBGs in 200s.  [facility]. Instructed that he needs to follow-up at the wellness Center again. Patient agrees.  Active Problems: Acute alcoholic pancreatitis Patient drinks heavily of about a 5th of mixed liquor along with beer. Mildly elevated lipase with abdominal CT showing pancreatitis with small area of necrosis. Continue hydration. Abdominal pain much better. Advance diet to full liquid.     AKI (acute kidney injury) (HCC) Prerenal secondary to dehydration. Resolved with IV fluids.  Alcohol abuse Counseled strongly on cessation. Patient wishes to quit. Monitor on CIWA. No signs of withdrawal. Counseled strongly on cessation. Continue thiamine, folate and multivitamin.     Hypokalemia/hypomagnesemia Replenished  Code Status : Full code  Family Communication  : None at bedside  Disposition Plan  : Advance to soft diet tomorrow. If tolerated well and CBG stable can be discharged home.  Barriers For Discharge : Improving symptoms  Consults  :  None  Procedures  : CT abdomen  DVT Prophylaxis  :  Lovenox -  Lab Results  Component Value Date   PLT 172 03/23/2016    Antibiotics  :  Anti-infectives    None        Objective:   Vitals:   03/22/16 1200 03/22/16 1600 03/22/16 2204 03/23/16 0456  BP:  126/72 (!) 140/92 138/84  Pulse:  82 89 86  Resp:  20 19 18   Temp: 98 F (36.7 C) 98 F (36.7 C) 98 F (36.7 C) 98.2 F (36.8 C)  TempSrc: Oral Oral Oral Oral  SpO2:  98% 100% 100%  Weight:      Height:        Wt Readings from Last 3 Encounters:  03/21/16 77.6 kg (171 lb)  05/09/15 77.7 kg (171 lb 3.2 oz)  03/27/15 73.8 kg (162 lb 9.6 oz)     Intake/Output Summary (Last 24 hours) at 03/23/16 1246 Last data filed at 03/23/16 0600  Gross per 24 hour  Intake              720 ml  Output                4 ml  Net              716 ml     Physical Exam  Gen: not in distress HEENT:  moist mucosa, supple neck Chest: clear b/l, no added sounds CVS: S1 and S2 Normal, no murmurs GI: soft, NT, ND, BS+ Musculoskeletal: warm, no edema CNS: Alert and oriented, no tremors    Data Review:    CBC  Recent Labs Lab 03/22/16 0014 03/23/16 0536  WBC 16.9* 9.2  HGB 17.4* 15.2  HCT 48.8 43.3  PLT 234 172  MCV 101.0* 100.7*  MCH 36.0* 35.3*  MCHC 35.7 35.1  RDW 12.5 12.4  LYMPHSABS 0.9  --   MONOABS 0.9  --   EOSABS 0.0  --   BASOSABS 0.1  --     Chemistries   Recent Labs Lab 03/22/16 0014 03/22/16 0624 03/22/16 1000 03/23/16 0536  NA 133* 136 135 132*  K 4.6 4.0 3.3* 3.6  CL 92* 103 105 99*  CO2 12* 19* 20* 18*  GLUCOSE 371* 152* 167* 290*  BUN 22* 13 10 7   CREATININE 1.33* 0.93 0.67 0.87  CALCIUM 10.0 8.1*  7.7* 8.2*  MG  --  1.7  --   --   AST 43*  --   --  24  ALT 33  --   --  21  ALKPHOS 118  --   --  78  BILITOT 2.7*  --   --  1.8*   ------------------------------------------------------------------------------------------------------------------ No results for input(s): CHOL, HDL, LDLCALC, TRIG, CHOLHDL, LDLDIRECT in the last 72 hours.  Lab Results  Component Value Date   HGBA1C 8.0 05/09/2015   ------------------------------------------------------------------------------------------------------------------ No results for input(s): TSH, T4TOTAL, T3FREE, THYROIDAB in the last 72 hours.  Invalid input(s): FREET3 ------------------------------------------------------------------------------------------------------------------ No results for input(s): VITAMINB12, FOLATE, FERRITIN, TIBC, IRON, RETICCTPCT in the last 72 hours.  Coagulation profile No results for input(s): INR, PROTIME in the last 168 hours.  No results for input(s): DDIMER in the last 72 hours.  Cardiac Enzymes No results for input(s): CKMB, TROPONINI, MYOGLOBIN in the last 168 hours.  Invalid input(s): CK ------------------------------------------------------------------------------------------------------------------ No results found for: BNP  Inpatient Medications  Scheduled Meds: . enoxaparin (LOVENOX) injection  40 mg Subcutaneous Q24H  . folic acid  1 mg Oral Daily  . insulin aspart  0-15 Units Subcutaneous TID WC  . insulin aspart  0-5 Units Subcutaneous QHS  . insulin aspart  4 Units Subcutaneous TID WC  . [  START ON 03/24/2016] insulin glargine  20 Units Subcutaneous Daily  . multivitamin with minerals  1 tablet Oral Daily  . potassium chloride  20 mEq Oral BID  . sodium chloride flush  3 mL Intravenous Q12H  . thiamine  100 mg Oral Daily   Or  . thiamine  100 mg Intravenous Daily   Continuous Infusions: . sodium chloride Stopped (03/22/16 1145)  . insulin (NOVOLIN-R) infusion Stopped (03/22/16  1900)   PRN Meds:.acetaminophen **OR** acetaminophen, LORazepam **OR** LORazepam, morphine injection, ondansetron **OR** ondansetron (ZOFRAN) IV  Micro Results Recent Results (from the past 240 hour(s))  MRSA PCR Screening     Status: None   Collection Time: 03/22/16  7:33 AM  Result Value Ref Range Status   MRSA by PCR NEGATIVE NEGATIVE Final    Comment:        The GeneXpert MRSA Assay (FDA approved for NASAL specimens only), is one component of a comprehensive MRSA colonization surveillance program. It is not intended to diagnose MRSA infection nor to guide or monitor treatment for MRSA infections.     Radiology Reports Ct Abdomen Pelvis W Contrast  Result Date: 03/22/2016 CLINICAL DATA:  25 y/o M; episodic of vomiting with abdominal pain and nausea. EXAM: CT ABDOMEN AND PELVIS WITH CONTRAST TECHNIQUE: Multidetector CT imaging of the abdomen and pelvis was performed using the standard protocol following bolus administration of intravenous contrast. CONTRAST:  ISOVUE-300 IOPAMIDOL (ISOVUE-300) INJECTION 61% COMPARISON:  11/20/2014 CT of abdomen and pelvis FINDINGS: Lower chest: No acute abnormality. Hepatobiliary: No focal liver abnormality is seen. No gallstones, gallbladder wall thickening, or biliary dilatation. Pancreas: Diffuse edema surrounding the pancreas compatible pancreatitis. Focus of hypoattenuation within pancreatic body (less than 25%) may represent necrosis. No peripancreatic collection identified. Spleen: Normal in size without focal abnormality. Adrenals/Urinary Tract: Adrenal glands are unremarkable. Kidneys are normal, without renal calculi, focal lesion, or hydronephrosis. Marked distention of the bladder. Stomach/Bowel: Stomach is within normal limits. Appendix not identify. No evidence of bowel wall thickening, distention, or inflammatory changes. Vascular/Lymphatic: No significant vascular findings are present. No enlarged abdominal or pelvic lymph nodes.  Reproductive: Prostate is unremarkable. Other: No abdominal wall hernia or abnormality. No abdominopelvic ascites. Musculoskeletal: No acute or significant osseous findings. IMPRESSION: Diffuse edema surrounding the pancreas compatible with acute pancreatitis. Focus of hypoattenuation within pancreatic body (less than 25%) may represent necrosis. No peripancreatic collection identified. Electronically Signed   By: Mitzi Hansen M.D.   On: 03/22/2016 03:28   Dg Abd Acute W/chest  Result Date: 03/22/2016 CLINICAL DATA:  25 y/o  M; 6 hours of vomiting. EXAM: DG ABDOMEN ACUTE W/ 1V CHEST COMPARISON:  11/19/2014 chest and abdomen radiographs. FINDINGS: There is no evidence of dilated bowel loops or free intraperitoneal air. No radiopaque calculi or other significant radiographic abnormality is seen. Heart size and mediastinal contours are within normal limits. Both lungs are clear. IMPRESSION: Negative abdominal radiographs.  No acute cardiopulmonary disease. Electronically Signed   By: Mitzi Hansen M.D.   On: 03/22/2016 00:57    Time Spent in minutes  25   Eddie North M.D on 03/23/2016 at 12:46 PM  Between 7am to 7pm - Pager - [phone]  After 7pm go to [email or web address] - password [facility]  [facility] -  Office  [phone]

## 2016-03-24 LAB — GLUCOSE, CAPILLARY
GLUCOSE-CAPILLARY: 272 mg/dL — AB (ref 65–99)
Glucose-Capillary: 229 mg/dL — ABNORMAL HIGH (ref 65–99)

## 2016-03-24 LAB — HEMOGLOBIN A1C
HEMOGLOBIN A1C: 9.1 % — AB (ref 4.8–5.6)
Mean Plasma Glucose: 214 mg/dL

## 2016-03-24 MED ORDER — ADULT MULTIVITAMIN W/MINERALS CH: 1.0000 | ORAL_TABLET | Freq: Every day | ORAL | 0 refills | Status: AC

## 2016-03-24 MED ORDER — LISINOPRIL 2.5 MG PO TABS: 2.5000 mg | ORAL_TABLET | Freq: Every day | ORAL | 0 refills | Status: DC

## 2016-03-24 NOTE — Discharge Summary (Signed)
Physician Discharge Summary  Christopher Robles PPI:[id number] DOB: 11-Aug-1990 DOA: 03/21/2016  PCP: Lorayne Marek, MD  Admit date: 03/21/2016 Discharge date: 03/24/2016  Admitted From:Home Disposition: Home    Recommendations for Outpatient Follow-up:  1. Follow up with PCP in 1-2 weeks 2. Please obtain BMP/CBC in one week   Home Health: No Equipment/Devices: No Discharge Condition: Stable CODE STATUS: Full code Diet recommendation: Carb modified diet  Brief/Interim Summary: 25 year old male with history of type 1 diabetes, alcohol abuse presented with 1 day of nausea vomiting and epigastric pain. Patient was found to have diabetic ketoacidosis without, and acute pancreatitis due to alcohol. Patient was treated with IV fluid and insulin drip. DKA improved. Patient reported that he was not watching his diet during Thanksgiving and he may have missed few doses of insulin at home. He said he only takes insulin NPH at home. He does not want to change his insulin regimen because of the cost. Patient reported that he can get insulin NPH at [facility]. He said he already has prescription and supplies. His DKA likely due to dietary inconsistency and not taking insulin.  During hospitalization patient was managed for acute kidney injury which was improved on discharge. Also treated for hypokalemia and hypomagnesemia. Education provided to the patient regarding monitoring blood sugar level, adjusting insulin dose and close follow-up with PCP. I advised patient to call wellness Center to make appointment sooner.  Also educated on cutting back alcohol use. Patient reported that he is very motivated this time to quit alcohol intake.  Patient's symptoms improved today. He denied fever, chills, headache, dizziness, nausea, vomiting, chest pain, shortness of breath, abdominal pain. Patient is discharged home in stable condition.   Discharge Diagnoses:  Principal Problem:   DKA (diabetic ketoacidoses)  ([place]) Active Problems:   Acute kidney injury (nontraumatic) (HCC)   ETOH abuse   Generalized abdominal pain   Alcohol-induced acute pancreatitis with uninfected necrosis    Discharge Instructions  Discharge Instructions    Call MD for:  difficulty breathing, headache or visual disturbances    Complete by:  As directed    Call MD for:  extreme fatigue    Complete by:  As directed    Call MD for:  hives    Complete by:  As directed    Call MD for:  persistant dizziness or light-headedness    Complete by:  As directed    Call MD for:  persistant nausea and vomiting    Complete by:  As directed    Call MD for:  severe uncontrolled pain    Complete by:  As directed    Call MD for:  temperature >100.4    Complete by:  As directed    Diet - low sodium heart healthy    Complete by:  As directed    Diet Carb Modified    Complete by:  As directed    Discharge instructions    Complete by:  As directed    Please monitor blood sugar level at home and follow-up with primary care doctor. Please call wellness Center to make appointment.   Increase activity slowly    Complete by:  As directed        Medication List    STOP taking these medications   gemfibrozil 600 MG tablet Commonly known as:  LOPID   insulin aspart 100 UNIT/ML FlexPen Commonly known as:  NOVOLOG FLEXPEN   Insulin Glargine 100 UNIT/ML Solostar Pen Commonly known as:  LANTUS SOLOSTAR  TAKE these medications   blood glucose meter kit and supplies Dispense based on patient and insurance preference. Use up to four times daily as directed. (FOR ICD-9 250.00, 250.01).   insulin NPH-regular Human (70-30) 100 UNIT/ML injection Commonly known as:  NOVOLIN 70/30 Inject 30 Units into the skin 2 (two) times daily.   Insulin Pen Needle 31G X 8 MM Misc Use 4 times daily with meals and at bedtime as directed.   lisinopril 2.5 MG tablet Commonly known as:  ZESTRIL Take 1 tablet (2.5 mg total) by mouth daily.    multivitamin with minerals Tabs tablet Take 1 tablet by mouth daily. Start taking on:  03/25/2016   TRUEPLUS INSULIN SYRINGE 30G X 5/16" 0.5 ML Misc Generic drug:  Insulin Syringe-Needle U-100 USE TO INJECT INSULIN DAILY AS DIRECTED BY PHYSICIAN      Follow-up Information    [facility]. Schedule an appointment as soon as possible for a visit in 1 week(s).   Contact information: [place] [phone]         No Known Allergies  Consultations:None   Procedures/Studies: None   Subjective: Patient was seen and examined at bedside. Patient reported feeling much better. He is eager to go home today. Denied headache, dizziness, nausea, vomiting, chest pain or shortness of breath. No abdominal pain.   Discharge Exam: Vitals:   03/23/16 2144 03/24/16 0630  BP: (!) 146/96 (!) 147/92  Pulse: 82 75  Resp: 16 16  Temp: 98.2 F (36.8 C) 97.7 F (36.5 C)   Vitals:   03/23/16 0456 03/23/16 1435 03/23/16 2144 03/24/16 0630  BP: 138/84 133/84 (!) 146/96 (!) 147/92  Pulse: 86 (!) 104 82 75  Resp: '18 20 16 16  '$ Temp: 98.2 F (36.8 C) 98.1 F (36.7 C) 98.2 F (36.8 C) 97.7 F (36.5 C)  TempSrc: Oral Oral Oral Oral  SpO2: 100% 100% 100% 100%  Weight:      Height:        General: Pt is alert, awake, not in acute distress Cardiovascular: RRR, S1/S2 +, no rubs, no gallops Respiratory: CTA bilaterally, no wheezing, no rhonchi Abdominal: Soft, NT, ND, bowel sounds + Extremities: no edema, no cyanosis    The results of significant diagnostics from this hospitalization (including imaging, microbiology, ancillary and laboratory) are listed below for reference.     Microbiology: Recent Results (from the past 240 hour(s))  MRSA PCR Screening     Status: None   Collection Time: 03/22/16  7:33 AM  Result Value Ref Range Status   MRSA by PCR NEGATIVE NEGATIVE Final    Comment:        The GeneXpert  MRSA Assay (FDA approved for NASAL specimens only), is one component of a comprehensive MRSA colonization surveillance program. It is not intended to diagnose MRSA infection nor to guide or monitor treatment for MRSA infections.      Labs: BNP (last 3 results) No results for input(s): BNP in the last 8760 hours. Basic Metabolic Panel:  Recent Labs Lab 03/22/16 0014 03/22/16 0624 03/22/16 1000 03/23/16 0536  NA 133* 136 135 132*  K 4.6 4.0 3.3* 3.6  CL 92* 103 105 99*  CO2 12* 19* 20* 18*  GLUCOSE 371* 152* 167* 290*  BUN 22* '13 10 7  '$ CREATININE 1.33* 0.93 0.67 0.87  CALCIUM 10.0 8.1* 7.7* 8.2*  MG  --  1.7  --   --   PHOS  --  2.3*  --   --    Liver Function Tests:  Recent Labs Lab 03/22/16 0014 03/23/16 0536  AST 43* 24  ALT 33 21  ALKPHOS 118 78  BILITOT 2.7* 1.8*  PROT 8.8* 6.7  ALBUMIN 5.3* 3.9    Recent Labs Lab 03/22/16 0014 03/23/16 0536  LIPASE 62* 101*   No results for input(s): AMMONIA in the last 168 hours. CBC:  Recent Labs Lab 03/22/16 0014 03/23/16 0536  WBC 16.9* 9.2  NEUTROABS 15.2*  --   HGB 17.4* 15.2  HCT 48.8 43.3  MCV 101.0* 100.7*  PLT 234 172   Cardiac Enzymes: No results for input(s): CKTOTAL, CKMB, CKMBINDEX, TROPONINI in the last 168 hours. BNP: Invalid input(s): POCBNP CBG:  Recent Labs Lab 03/23/16 1231 03/23/16 1706 03/23/16 1810 03/23/16 2146 03/24/16 0804  GLUCAP 127* 271* 272* 158* 272*   D-Dimer No results for input(s): DDIMER in the last 72 hours. Hgb A1c  Recent Labs  03/23/16 0536  HGBA1C 9.1*   Lipid Profile No results for input(s): CHOL, HDL, LDLCALC, TRIG, CHOLHDL, LDLDIRECT in the last 72 hours. Thyroid function studies No results for input(s): TSH, T4TOTAL, T3FREE, THYROIDAB in the last 72 hours.  Invalid input(s): FREET3 Anemia work up No results for input(s): VITAMINB12, FOLATE, FERRITIN, TIBC, IRON, RETICCTPCT in the last 72 hours. Urinalysis    Component Value Date/Time    COLORURINE YELLOW 03/22/2016 0733   APPEARANCEUR CLEAR 03/22/2016 0733   LABSPEC >1.046 (H) 03/22/2016 0733   PHURINE 5.5 03/22/2016 0733   GLUCOSEU >1000 (A) 03/22/2016 0733   HGBUR TRACE (A) 03/22/2016 0733   BILIRUBINUR SMALL (A) 03/22/2016 0733   BILIRUBINUR neg 03/27/2015 0921   KETONESUR >80 (A) 03/22/2016 0733   PROTEINUR 30 (A) 03/22/2016 0733   UROBILINOGEN 0.2 03/27/2015 0921   UROBILINOGEN 0.2 02/12/2015 0750   NITRITE NEGATIVE 03/22/2016 0733   LEUKOCYTESUR NEGATIVE 03/22/2016 0733   Sepsis Labs Invalid input(s): PROCALCITONIN,  WBC,  LACTICIDVEN Microbiology Recent Results (from the past 240 hour(s))  MRSA PCR Screening     Status: None   Collection Time: 03/22/16  7:33 AM  Result Value Ref Range Status   MRSA by PCR NEGATIVE NEGATIVE Final    Comment:        The GeneXpert MRSA Assay (FDA approved for NASAL specimens only), is one component of a comprehensive MRSA colonization surveillance program. It is not intended to diagnose MRSA infection nor to guide or monitor treatment for MRSA infections.      Time coordinating discharge: 27 minutes  SIGNED:   Rosita Fire, MD  [facility] 03/24/2016, 11:16 AM  If 7PM-7AM, please contact night-coverage [email or web address] Password [email or web address]

## 2016-03-24 NOTE — Progress Notes (Signed)
Inpatient Diabetes Program Recommendations  AACE/ADA: New Consensus Statement on Inpatient Glycemic Control (2015)  Target Ranges:  Prepandial:   less than 140 mg/dL      Peak postprandial:   less than 180 mg/dL (1-2 hours)      Critically ill patients:  140 - 180 mg/dL   Lab Results  Component Value Date   GLUCAP 229 (H) 03/24/2016   HGBA1C 9.1 (H) 03/23/2016    Review of Glycemic Control  Spoke with pt regarding his diabetes regimen and HgbA1C of 9.1%. [facility]. Pt states he checks his blood sugars and has been using Humulin 70/30 at home. States he needs to stop drinking alcohol and start eating better and exercising. States he has a lot of stress with family and "hangs out with the wrong people."  To be discharged on 70/30 30 units bid. Has Regular insulin for correction.  Pt seems to be motivated to take care of himself and questions were answered.  Thank you. Ailene Ardshonda Tyrece Vanterpool, RD, LDN, CDE Inpatient Diabetes Coordinator [phone]

## 2016-05-12 ENCOUNTER — Encounter: Payer: Self-pay | Admitting: Family Medicine

## 2016-05-12 ENCOUNTER — Ambulatory Visit: Payer: Self-pay | Attending: Family Medicine | Admitting: Family Medicine

## 2016-05-12 VITALS — BP 162/105 | HR 106 | Temp 98.5°F | Ht 65.0 in | Wt 170.4 lb

## 2016-05-12 DIAGNOSIS — Z9114 Patient's other noncompliance with medication regimen: Secondary | ICD-10-CM | POA: Insufficient documentation

## 2016-05-12 DIAGNOSIS — E1010 Type 1 diabetes mellitus with ketoacidosis without coma: Secondary | ICD-10-CM | POA: Insufficient documentation

## 2016-05-12 DIAGNOSIS — Z91199 Patient's noncompliance with other medical treatment and regimen due to unspecified reason: Secondary | ICD-10-CM

## 2016-05-12 DIAGNOSIS — Z79899 Other long term (current) drug therapy: Secondary | ICD-10-CM | POA: Insufficient documentation

## 2016-05-12 DIAGNOSIS — I10 Essential (primary) hypertension: Secondary | ICD-10-CM | POA: Insufficient documentation

## 2016-05-12 DIAGNOSIS — Z9119 Patient's noncompliance with other medical treatment and regimen: Secondary | ICD-10-CM | POA: Insufficient documentation

## 2016-05-12 DIAGNOSIS — F1010 Alcohol abuse, uncomplicated: Secondary | ICD-10-CM | POA: Insufficient documentation

## 2016-05-12 DIAGNOSIS — E1059 Type 1 diabetes mellitus with other circulatory complications: Secondary | ICD-10-CM | POA: Insufficient documentation

## 2016-05-12 DIAGNOSIS — Z794 Long term (current) use of insulin: Secondary | ICD-10-CM | POA: Insufficient documentation

## 2016-05-12 LAB — GLUCOSE, POCT (MANUAL RESULT ENTRY): POC Glucose: 431 mg/dl — AB (ref 70–99)

## 2016-05-12 MED ORDER — LISINOPRIL 10 MG PO TABS: 10.0000 mg | ORAL_TABLET | Freq: Every day | ORAL | 5 refills | Status: DC

## 2016-05-12 MED ORDER — INSULIN NPH ISOPHANE & REGULAR (70-30) 100 UNIT/ML ~~LOC~~ SUSP: 30.0000 [IU] | Freq: Two times a day (BID) | SUBCUTANEOUS | 5 refills | Status: DC

## 2016-05-12 NOTE — Progress Notes (Signed)
Subjective:  Patient ID: Christopher Robles, male    DOB: [birth year]  Age: 26 y.o. MRN: [id number]  CC: Hypertension; Diabetes; and Hospitalization Follow-up   HPI Christopher HETTICH before meals 26 year old patient with a history of type 1 diabetes mellitus (A1c 9.1), hypertension, noncompliance, alcohol abuse who presents today for follow-up visit. He was last seen in the clinic 1 year ago and endorses medication nonadherence and dietary indiscretion and consumption of significant amounts of alcohol.  He was hospitalized for DKA and alcohol-induced pancreatitis 6 weeks ago and reports improvement in symptoms. He quit alcohol one month ago as per patient.  His blood pressure is elevated and he has been out of his antihypertensive. He requests his medications be sent to [facility] which is closer to his house as he has difficulty making it to the clinic to pick up his medicine.  Past Medical History:  Diagnosis Date  . Anxiety   . DM I (diabetes mellitus, type I), uncontrolled ([place])   . ETOH abuse   . Seizure [facility])     Past Surgical History:  Procedure Laterality Date  . APPENDECTOMY      No Known Allergies   Outpatient Medications Prior to Visit  Medication Sig Dispense Refill  . blood glucose meter kit and supplies Dispense based on patient and insurance preference. Use up to four times daily as directed. (FOR ICD-9 250.00, 250.01). 1 each 0  . Insulin Pen Needle 31G X 8 MM MISC Use 4 times daily with meals and at bedtime as directed. 120 each 5  . Multiple Vitamin (MULTIVITAMIN WITH MINERALS) TABS tablet Take 1 tablet by mouth daily. 30 tablet 0  . TRUEPLUS INSULIN SYRINGE 30G X 5/16" 0.5 ML MISC USE TO INJECT INSULIN DAILY AS DIRECTED BY PHYSICIAN 100 each 12  . insulin NPH-regular Human (NOVOLIN 70/30) (70-30) 100 UNIT/ML injection Inject 30 Units into the skin 2 (two) times daily.    Marland Kitchen lisinopril (ZESTRIL) 2.5 MG tablet Take 1 tablet (2.5 mg total) by mouth daily. (Patient not taking:  Reported on 05/12/2016) 30 tablet 0   No facility-administered medications prior to visit.     ROS Review of Systems  Constitutional: Negative for activity change and appetite change.  HENT: Negative for sinus pressure and sore throat.   Eyes: Negative for visual disturbance.  Respiratory: Negative for cough, chest tightness and shortness of breath.   Cardiovascular: Negative for chest pain and leg swelling.  Gastrointestinal: Negative for abdominal distention, abdominal pain, constipation and diarrhea.  Endocrine: Negative.   Genitourinary: Negative for dysuria.  Musculoskeletal: Negative for joint swelling and myalgias.  Skin: Negative for rash.  Allergic/Immunologic: Negative.   Neurological: Negative for weakness, light-headedness and numbness.  Psychiatric/Behavioral: Negative for dysphoric mood and suicidal ideas.    Objective:  BP (!) 162/105 (BP Location: Right Arm, Patient Position: Sitting, Cuff Size: Small)   Pulse (!) 106   Temp 98.5 F (36.9 C) (Oral)   Ht _0  (1.651 m)   Wt 170 lb 6.4 oz (77.3 kg)   SpO2 100%   BMI 28.36 kg/m   BP/Weight 05/12/2016 03/24/2016 22/48/2500  Systolic BP 370 488 -  Diastolic BP 891 92 -  Wt. (Lbs) 170.4 - 171  BMI 28.36 - 27.6      Physical Exam  Constitutional: He is oriented to person, place, and time. He appears well-developed and well-nourished.  Cardiovascular: Normal rate, normal heart sounds and intact distal pulses.   No murmur heard. Pulmonary/Chest: Effort normal  and breath sounds normal. He has no wheezes. He has no rales. He exhibits no tenderness.  Abdominal: Soft. Bowel sounds are normal. He exhibits no distension and no mass. There is no tenderness.  Musculoskeletal: Normal range of motion.  Neurological: He is alert and oriented to person, place, and time.  Skin: Skin is warm.  Psychiatric: He has a normal mood and affect.     Lab Results  Component Value Date   HGBA1C 9.1 (H) 03/23/2016    CMP  Latest Ref Rng & Units 03/23/2016 03/22/2016 03/22/2016  Glucose 65 - 99 mg/dL 290(H) 167(H) 152(H)  BUN 6 - 20 mg/dL _0 Creatinine 0.61 - 1.24 mg/dL 0.87 0.67 0.93  Sodium 135 - 145 mmol/L 132(L) 135 136  Potassium 3.5 - 5.1 mmol/L 3.6 3.3(L) 4.0  Chloride 101 - 111 mmol/L 99(L) 105 103  CO2 22 - 32 mmol/L 18(L) 20(L) 19(L)  Calcium 8.9 - 10.3 mg/dL 8.2(L) 7.7(L) 8.1(L)  Total Protein 6.5 - 8.1 g/dL 6.7 - -  Total Bilirubin 0.3 - 1.2 mg/dL 1.8(H) - -  Alkaline Phos 38 - 126 U/L 78 - -  AST 15 - 41 U/L 24 - -  ALT 17 - 63 U/L 21 - -     Assessment & Plan:   1. Uncontrolled type 1 diabetes mellitus with ketoacidosis without coma ([place]) Uncontrolled with A1c of 9.1 due to noncompliance CBG of 431; we'll hold off on administering NovoLog in the clinic as he just took 30 units of Relion prior to coming here Patient to keep blood sugar log which really reviewed at his next visit Will place on a statin at his next visit Keep blood sugar logs with fasting goals of 80-120 mg/dl, random of less than 180 and in the event of sugars less than 60 mg/dl or greater than 400 mg/dl please notify the clinic ASAP. It is recommended that you undergo annual eye exams and annual foot exams. Pneumovax is recommended every 5 years before the age of 64 and once for a lifetime at or after the age of 50. - Glucose (CBG) - insulin NPH-regular Human (NOVOLIN 70/30) (70-30) 100 UNIT/ML injection; Inject 30 Units into the skin 2 (two) times daily.  Dispense: 30 mL; Refill: 5  2. Noncompliance with diabetes treatment Have discussed complications of noncompliance The patient is willing to work at this  3. Essential hypertension Uncontrolled due to noncompliance Increased dose of lisinopril - lisinopril (PRINIVIL,ZESTRIL) 10 MG tablet; Take 1 tablet (10 mg total) by mouth daily.  Dispense: 30 tablet; Refill: 5  4. ETOH abuse Discussed cessation He sporadically attends alcoholic anonymous  meetings   Meds ordered this encounter  Medications  . lisinopril (PRINIVIL,ZESTRIL) 10 MG tablet    Sig: Take 1 tablet (10 mg total) by mouth daily.    Dispense:  30 tablet    Refill:  5  . insulin NPH-regular Human (NOVOLIN 70/30) (70-30) 100 UNIT/ML injection    Sig: Inject 30 Units into the skin 2 (two) times daily.    Dispense:  30 mL    Refill:  5    Follow-up: Return in about 1 month (around 06/12/2016) for Follow-up on diabetes mellitus.   Arnoldo Morale MD

## 2016-05-12 NOTE — Patient Instructions (Signed)
Hypertension Hypertension, commonly called high blood pressure, is when the force of blood pumping through your arteries is too strong. Your arteries are the blood vessels that carry blood from your heart throughout your body. A blood pressure reading consists of a higher number over a lower number, such as 110/72. The higher number (systolic) is the pressure inside your arteries when your heart pumps. The lower number (diastolic) is the pressure inside your arteries when your heart relaxes. Ideally you want your blood pressure below 120/80. Hypertension forces your heart to work harder to pump blood. Your arteries may become narrow or stiff. Having untreated or uncontrolled hypertension can cause heart attack, stroke, kidney disease, and other problems. What increases the risk? Some risk factors for high blood pressure are controllable. Others are not. Risk factors you cannot control include:  Race. You may be at higher risk if you are African American.  Age. Risk increases with age.  Gender. Men are at higher risk than women before age 45 years. After age 65, women are at higher risk than men. Risk factors you can control include:  Not getting enough exercise or physical activity.  Being overweight.  Getting too much fat, sugar, calories, or salt in your diet.  Drinking too much alcohol. What are the signs or symptoms? Hypertension does not usually cause signs or symptoms. Extremely high blood pressure (hypertensive crisis) may cause headache, anxiety, shortness of breath, and nosebleed. How is this diagnosed? To check if you have hypertension, your health care provider will measure your blood pressure while you are seated, with your arm held at the level of your heart. It should be measured at least twice using the same arm. Certain conditions can cause a difference in blood pressure between your right and left arms. A blood pressure reading that is higher than normal on one occasion does  not mean that you need treatment. If it is not clear whether you have high blood pressure, you may be asked to return on a different day to have your blood pressure checked again. Or, you may be asked to monitor your blood pressure at home for 1 or more weeks. How is this treated? Treating high blood pressure includes making lifestyle changes and possibly taking medicine. Living a healthy lifestyle can help lower high blood pressure. You may need to change some of your habits. Lifestyle changes may include:  Following the DASH diet. This diet is high in fruits, vegetables, and whole grains. It is low in salt, red meat, and added sugars.  Keep your sodium intake below 2,300 mg per day.  Getting at least 30-45 minutes of aerobic exercise at least 4 times per week.  Losing weight if necessary.  Not smoking.  Limiting alcoholic beverages.  Learning ways to reduce stress. Your health care provider may prescribe medicine if lifestyle changes are not enough to get your blood pressure under control, and if one of the following is true:  You are 18-59 years of age and your systolic blood pressure is above 140.  You are 60 years of age or older, and your systolic blood pressure is above 150.  Your diastolic blood pressure is above 90.  You have diabetes, and your systolic blood pressure is over 140 or your diastolic blood pressure is over 90.  You have kidney disease and your blood pressure is above 140/90.  You have heart disease and your blood pressure is above 140/90. Your personal target blood pressure may vary depending on your medical   conditions, your age, and other factors. Follow these instructions at home:  Have your blood pressure rechecked as directed by your health care provider.  Take medicines only as directed by your health care provider. Follow the directions carefully. Blood pressure medicines must be taken as prescribed. The medicine does not work as well when you skip  doses. Skipping doses also puts you at risk for problems.  Do not smoke.  Monitor your blood pressure at home as directed by your health care provider. Contact a health care provider if:  You think you are having a reaction to medicines taken.  You have recurrent headaches or feel dizzy.  You have swelling in your ankles.  You have trouble with your vision. Get help right away if:  You develop a severe headache or confusion.  You have unusual weakness, numbness, or feel faint.  You have severe chest or abdominal pain.  You vomit repeatedly.  You have trouble breathing. This information is not intended to replace advice given to you by your health care provider. Make sure you discuss any questions you have with your health care provider. Document Released: 04/14/2005 Document Revised: 09/20/2015 Document Reviewed: 02/04/2013 Elsevier Interactive Patient Education  2017 Elsevier Inc.  

## 2016-05-12 NOTE — Progress Notes (Signed)
Out of lisinopril for 3 days

## 2016-09-03 ENCOUNTER — Ambulatory Visit: Payer: Self-pay | Admitting: Family Medicine

## 2016-11-11 ENCOUNTER — Encounter: Payer: Self-pay | Admitting: Family Medicine

## 2016-11-11 ENCOUNTER — Ambulatory Visit: Payer: Managed Care, Other (non HMO) | Attending: Family Medicine | Admitting: Family Medicine

## 2016-11-11 VITALS — BP 111/75 | HR 100 | Temp 97.8°F | Resp 18 | Ht 65.0 in | Wt 167.0 lb

## 2016-11-11 DIAGNOSIS — F329 Major depressive disorder, single episode, unspecified: Secondary | ICD-10-CM | POA: Insufficient documentation

## 2016-11-11 DIAGNOSIS — F1010 Alcohol abuse, uncomplicated: Secondary | ICD-10-CM | POA: Insufficient documentation

## 2016-11-11 DIAGNOSIS — Z794 Long term (current) use of insulin: Secondary | ICD-10-CM | POA: Insufficient documentation

## 2016-11-11 DIAGNOSIS — F32A Depression, unspecified: Secondary | ICD-10-CM | POA: Insufficient documentation

## 2016-11-11 DIAGNOSIS — F419 Anxiety disorder, unspecified: Secondary | ICD-10-CM | POA: Insufficient documentation

## 2016-11-11 DIAGNOSIS — I10 Essential (primary) hypertension: Secondary | ICD-10-CM | POA: Insufficient documentation

## 2016-11-11 DIAGNOSIS — E1010 Type 1 diabetes mellitus with ketoacidosis without coma: Secondary | ICD-10-CM | POA: Insufficient documentation

## 2016-11-11 DIAGNOSIS — Z9119 Patient's noncompliance with other medical treatment and regimen: Secondary | ICD-10-CM | POA: Diagnosis not present

## 2016-11-11 DIAGNOSIS — Z9889 Other specified postprocedural states: Secondary | ICD-10-CM | POA: Diagnosis not present

## 2016-11-11 LAB — POCT GLYCOSYLATED HEMOGLOBIN (HGB A1C): Hemoglobin A1C: 8.6

## 2016-11-11 LAB — GLUCOSE, POCT (MANUAL RESULT ENTRY): POC Glucose: 224 mg/dl — AB (ref 70–99)

## 2016-11-11 MED ORDER — ATORVASTATIN CALCIUM 20 MG PO TABS: 20.0000 mg | ORAL_TABLET | Freq: Every day | ORAL | 5 refills | Status: DC

## 2016-11-11 MED ORDER — FLUOXETINE HCL 20 MG PO TABS: 20.0000 mg | ORAL_TABLET | Freq: Every day | ORAL | 5 refills | Status: DC

## 2016-11-11 MED ORDER — LISINOPRIL 10 MG PO TABS: 10.0000 mg | ORAL_TABLET | Freq: Every day | ORAL | 5 refills | Status: DC

## 2016-11-11 MED ORDER — INSULIN GLARGINE 100 UNIT/ML SOLOSTAR PEN: 30.0000 [IU] | PEN_INJECTOR | Freq: Every day | SUBCUTANEOUS | 5 refills | Status: DC

## 2016-11-11 MED ORDER — INSULIN ASPART 100 UNIT/ML FLEXPEN: PEN_INJECTOR | SUBCUTANEOUS | 5 refills | Status: DC

## 2016-11-11 NOTE — Patient Instructions (Signed)

## 2016-11-11 NOTE — Progress Notes (Signed)
Subjective:  Patient ID: Christopher Robles, male    DOB: 23-Jan-1991  Age: 26 y.o. MRN: [id number]  CC: Follow-up and Diabetes   HPI Christopher Robles is a  26 year old patient with a history of type 1 diabetes mellitus (A1c 8.6 down from 9.1), hypertension, noncompliance, alcohol abuse who presents today for follow-up visit.  He is requesting switching from Levemir to Lantus and NovoLog. He endorses compliance with his Levemir but states his random sugars remain in the 200-300 range. He works at [employer] but states he does not eat the doughnuts but endorses drinking sugar-free latte. Denies visual complaints, numbness in extremities.  He complains of being stressed. He is accompanied by his stepmother states she has noticed him being depressed and at other times anxious but he denies suicidal ideation or intent. He continues to drink significant amounts of alcohol but states he has cut back; he now drinks 6-7 beers about 2-3 times a week depending on when he had his friends over. He is working on quitting through the help of his girlfriend.  Past Medical History:  Diagnosis Date  . Anxiety   . DM I (diabetes mellitus, type I), uncontrolled ([place])   . ETOH abuse   . Seizure [facility])     Past Surgical History:  Procedure Laterality Date  . APPENDECTOMY      No Known Allergies   Outpatient Medications Prior to Visit  Medication Sig Dispense Refill  . blood glucose meter kit and supplies Dispense based on patient and insurance preference. Use up to four times daily as directed. (FOR ICD-9 250.00, 250.01). 1 each 0  . Insulin Pen Needle 31G X 8 MM MISC Use 4 times daily with meals and at bedtime as directed. 120 each 5  . Multiple Vitamin (MULTIVITAMIN WITH MINERALS) TABS tablet Take 1 tablet by mouth daily. 30 tablet 0  . TRUEPLUS INSULIN SYRINGE 30G X 5/16" 0.5 ML MISC USE TO INJECT INSULIN DAILY AS DIRECTED BY PHYSICIAN 100 each 12  . insulin NPH-regular Human (NOVOLIN 70/30)  (70-30) 100 UNIT/ML injection Inject 30 Units into the skin 2 (two) times daily. 30 mL 5  . lisinopril (PRINIVIL,ZESTRIL) 10 MG tablet Take 1 tablet (10 mg total) by mouth daily. 30 tablet 5   No facility-administered medications prior to visit.     ROS Review of Systems  Constitutional: Negative for activity change and appetite change.  HENT: Negative for sinus pressure and sore throat.   Eyes: Negative for visual disturbance.  Respiratory: Negative for cough, chest tightness and shortness of breath.   Cardiovascular: Negative for chest pain and leg swelling.  Gastrointestinal: Negative for abdominal distention, abdominal pain, constipation and diarrhea.  Endocrine: Negative.   Genitourinary: Negative for dysuria.  Musculoskeletal: Negative for joint swelling and myalgias.  Skin: Negative for rash.  Allergic/Immunologic: Negative.   Neurological: Negative for weakness, light-headedness and numbness.  Psychiatric/Behavioral:       See hpi    Objective:  BP 111/75 (BP Location: Right Arm, Patient Position: Sitting, Cuff Size: Normal)   Pulse 100   Temp 97.8 F (36.6 C)   Resp 18   Ht _0  (1.651 m)   Wt 167 lb (75.8 kg)   SpO2 100%   BMI 27.79 kg/m   BP/Weight 11/11/2016 05/12/2016 62/56/3893  Systolic BP 734 287 681  Diastolic BP 75 157 92  Wt. (Lbs) 167 170.4 -  BMI 27.79 28.36 -     Physical Exam  Constitutional: He is  oriented to person, place, and time. He appears well-developed and well-nourished.  Cardiovascular: Normal rate, normal heart sounds and intact distal pulses.   No murmur heard. Pulmonary/Chest: Effort normal and breath sounds normal. He has no wheezes. He has no rales. He exhibits no tenderness.  Abdominal: Soft. Bowel sounds are normal. He exhibits no distension and no mass. There is no tenderness.  Musculoskeletal: Normal range of motion.  Neurological: He is alert and oriented to person, place, and time.  Skin: Skin is warm and dry.    Psychiatric: He has a normal mood and affect.     CMP Latest Ref Rng & Units 03/23/2016 03/22/2016 03/22/2016  Glucose 65 - 99 mg/dL 290(H) 167(H) 152(H)  BUN 6 - 20 mg/dL _0 Creatinine 0.61 - 1.24 mg/dL 0.87 0.67 0.93  Sodium 135 - 145 mmol/L 132(L) 135 136  Potassium 3.5 - 5.1 mmol/L 3.6 3.3(L) 4.0  Chloride 101 - 111 mmol/L 99(L) 105 103  CO2 22 - 32 mmol/L 18(L) 20(L) 19(L)  Calcium 8.9 - 10.3 mg/dL 8.2(L) 7.7(L) 8.1(L)  Total Protein 6.5 - 8.1 g/dL 6.7 - -  Total Bilirubin 0.3 - 1.2 mg/dL 1.8(H) - -  Alkaline Phos 38 - 126 U/L 78 - -  AST 15 - 41 U/L 24 - -  ALT 17 - 63 U/L 21 - -   Lab Results  Component Value Date   HGBA1C 8.6 11/11/2016     Assessment & Plan:   1. Uncontrolled type 1 diabetes mellitus with ketoacidosis without coma ([place]) Uncontrolled with A1c of 8.6 which is down from 9.1 previously Switched from Levemir to Lantus as per patient request Commenced on NovoLog sliding scale : For blood sugars from 0-150, give 0 units of insulin; 151-200, give 2 units; 201-250, give 4 units; 251-300, give 6 units; 301-350, give 8 units; 351-400, give 10 units; greater than 400 give 12 units Keep blood sugar logs with fasting goals of 80-120 mg/dl, random of less than 180 and in the event of sugars less than 60 mg/dl or greater than 400 mg/dl please notify the clinic ASAP. It is recommended that you undergo annual eye exams and annual foot exams. Pneumovax is recommended every 5 years before the age of 80 and once for a lifetime at or after the age of 83. - POCT A1C - Glucose (CBG) - atorvastatin (LIPITOR) 20 MG tablet; Take 1 tablet (20 mg total) by mouth daily.  Dispense: 30 tablet; Refill: 5 - Insulin Glargine (LANTUS SOLOSTAR) 100 UNIT/ML Solostar Pen; Inject 30 Units into the skin daily at 10 pm.  Dispense: 5 pen; Refill: 5 - insulin aspart (NOVOLOG FLEXPEN) 100 UNIT/ML FlexPen; 0-12 units 3 times daily before meals as per sliding scale.  Dispense: 15 mL; Refill:  5 - CMP14+EGFR; Future - Lipid panel; Future - Microalbumin/Creatinine Ratio, Urine; Future - Ambulatory referral to Ophthalmology  2. Essential hypertension Controlled Low-sodium diet - lisinopril (PRINIVIL,ZESTRIL) 10 MG tablet; Take 1 tablet (10 mg total) by mouth daily.  Dispense: 30 tablet; Refill: 5  3. Anxiety and depression Discussed onset of action - FLUoxetine (PROZAC) 20 MG tablet; Take 1 tablet (20 mg total) by mouth daily.  Dispense: 30 tablet; Refill: 5  4. ETOH abuse He is refusing referral to alcohol anonymous He does have a good support system that is helping him cut back   Meds ordered this encounter  Medications  . atorvastatin (LIPITOR) 20 MG tablet    Sig: Take 1 tablet (20 mg  total) by mouth daily.    Dispense:  30 tablet    Refill:  5  . lisinopril (PRINIVIL,ZESTRIL) 10 MG tablet    Sig: Take 1 tablet (10 mg total) by mouth daily.    Dispense:  30 tablet    Refill:  5  . FLUoxetine (PROZAC) 20 MG tablet    Sig: Take 1 tablet (20 mg total) by mouth daily.    Dispense:  30 tablet    Refill:  5  . Insulin Glargine (LANTUS SOLOSTAR) 100 UNIT/ML Solostar Pen    Sig: Inject 30 Units into the skin daily at 10 pm.    Dispense:  5 pen    Refill:  5  . insulin aspart (NOVOLOG FLEXPEN) 100 UNIT/ML FlexPen    Sig: 0-12 units 3 times daily before meals as per sliding scale.    Dispense:  15 mL    Refill:  5    Follow-up: Return in about 3 months (around 02/11/2017) for Follow-up on diabetes mellitus,  anxiety and depression.   This note has been created with Surveyor, quantity. Any transcriptional errors are unintentional.     Arnoldo Morale MD

## 2016-11-11 NOTE — Progress Notes (Signed)
Patient has not eaten Patient has taken his medications  Patient wants to change his dm medications  Patient wants to talk about stress

## 2016-11-12 ENCOUNTER — Other Ambulatory Visit: Payer: Self-pay | Admitting: Pharmacist

## 2016-11-12 MED ORDER — INSULIN LISPRO 100 UNIT/ML (KWIKPEN): 0.0000 [IU] | PEN_INJECTOR | Freq: Three times a day (TID) | SUBCUTANEOUS | 5 refills | Status: DC

## 2016-11-24 ENCOUNTER — Telehealth: Payer: Self-pay | Admitting: Family Medicine

## 2016-11-24 NOTE — Telephone Encounter (Signed)
This has already been taken care of - will delete request out of Cover My Meds.

## 2016-11-24 NOTE — Telephone Encounter (Signed)
Cover my Med called Ladona Ridgel([place]) to find out if the out for med has been approval, please call tham back Ref# [id number] Anyone that answer the call can help...Marland Kitchen.Marland Kitchen.Marland Kitchen.please call them back

## 2017-04-05 IMAGING — CT CT ABD-PELV W/ CM
2 of 4 series · 16 of 46 positions shown, 18 images · IV contrast (ISOVUE)
Comparison: 11/20/2014 CT of abdomen and pelvis

CLINICAL DATA: 25 y/o M; episodic of vomiting with abdominal pain
and nausea.

EXAM:
CT ABDOMEN AND PELVIS WITH CONTRAST
TECHNIQUE: Multidetector CT imaging of the abdomen and pelvis was performed
using the standard protocol following bolus administration of
intravenous contrast.
CONTRAST:  100mL 4JZQDE-ZCC IOPAMIDOL (4JZQDE-ZCC) INJECTION 61%

[Series 2: abd/pel with · axial · 0.72mm/px · z∈[-437,-62]mm · 13 of 87 slices shown, 15 images]
[im 6/87  soft-tissue]
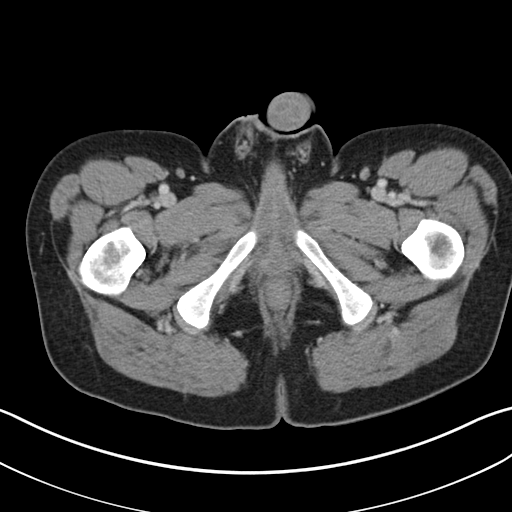
[im 6/87  bone]
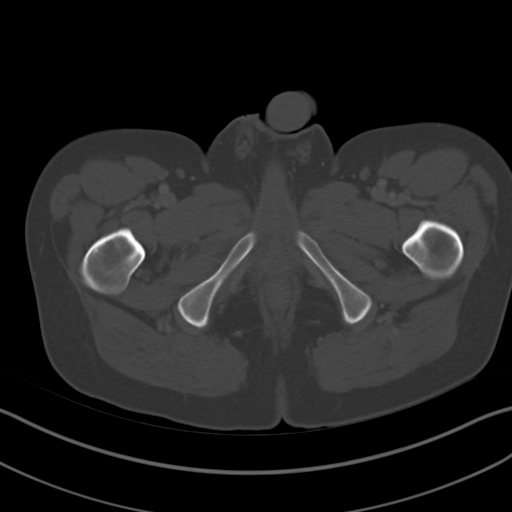
[im 11/87  soft-tissue]
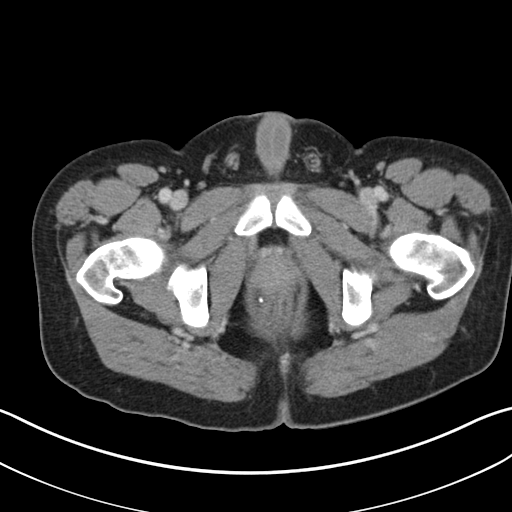
[im 17/87  soft-tissue]
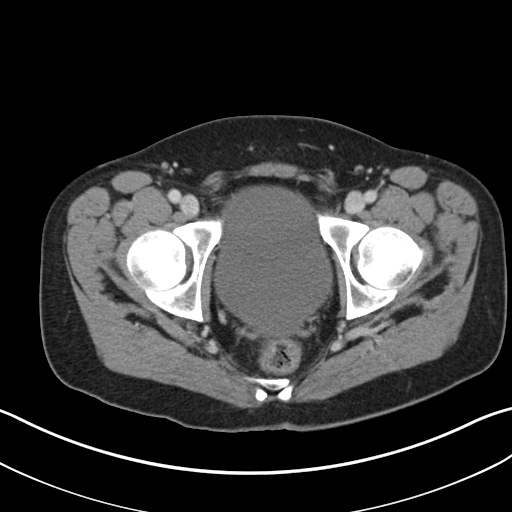
[im 27/87  soft-tissue]
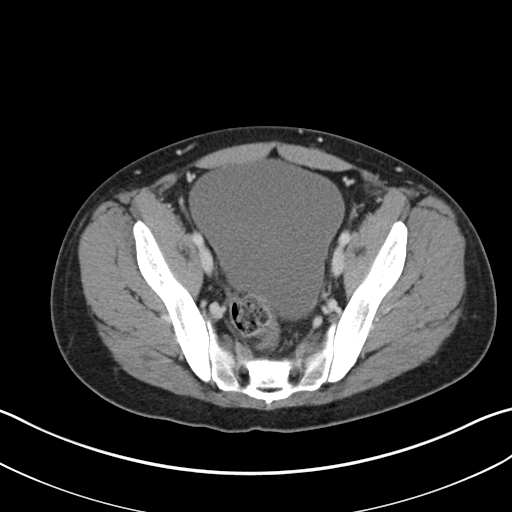
[im 33/87  soft-tissue]
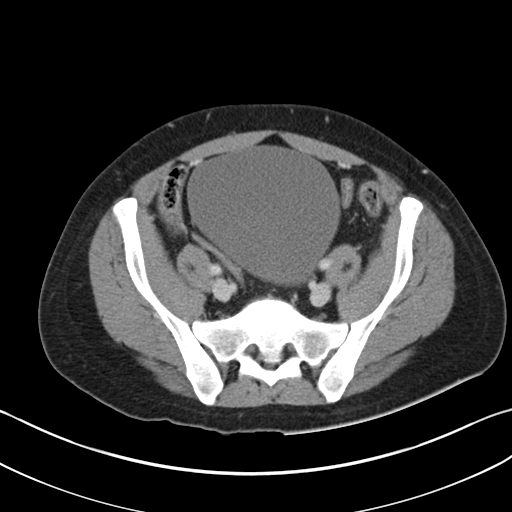
[im 38/87  soft-tissue]
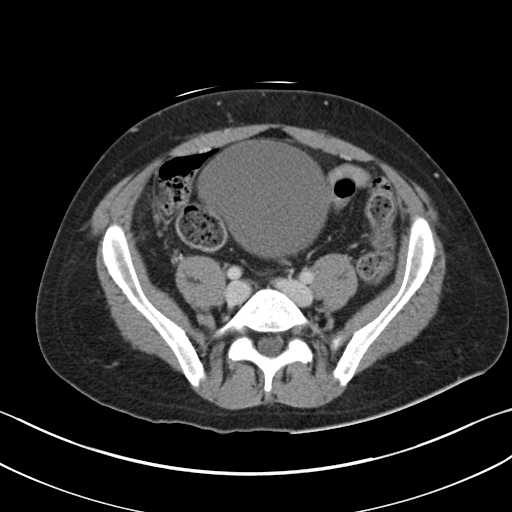
[im 44/87  soft-tissue]
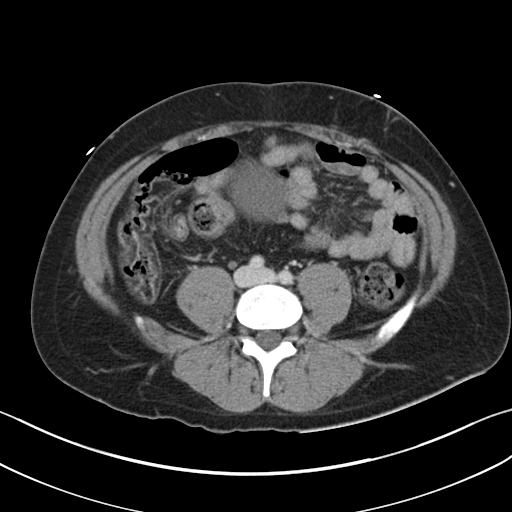
[im 49/87  soft-tissue]
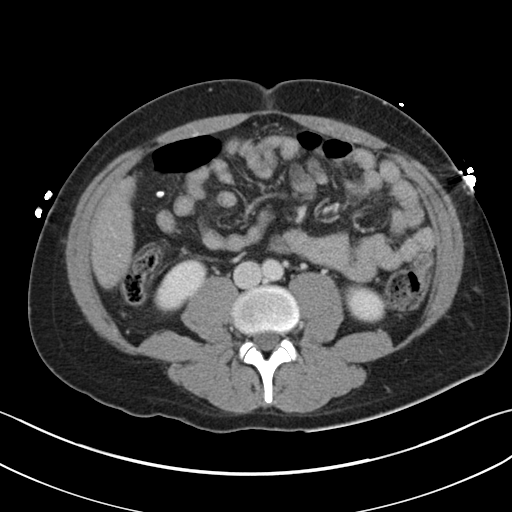
[im 54/87  soft-tissue]
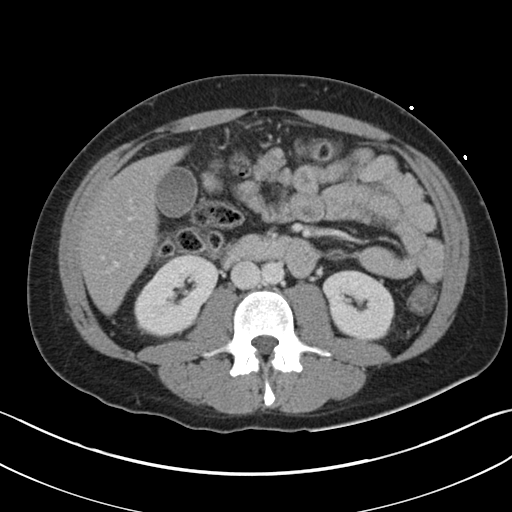
[im 54/87  bone]
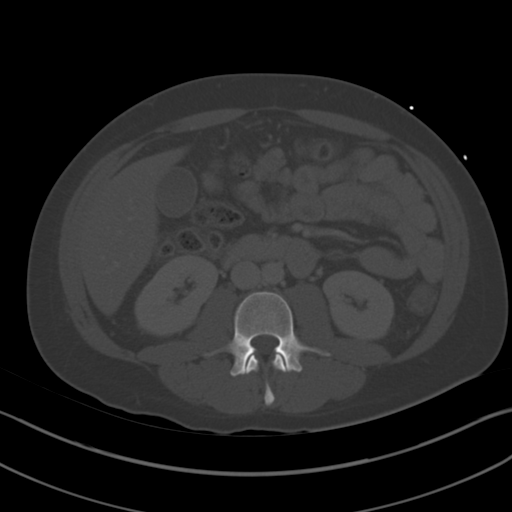
[im 60/87  soft-tissue]
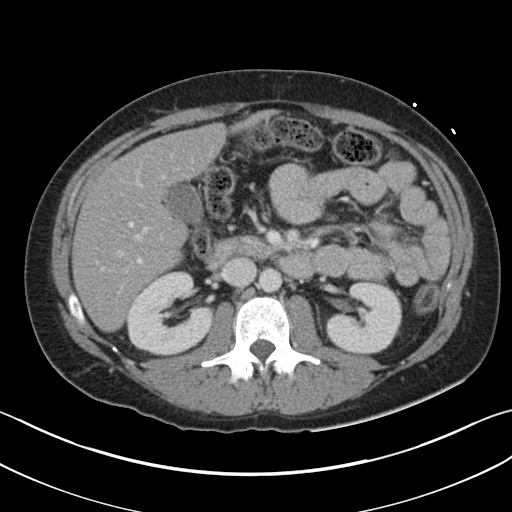
[im 70/87  soft-tissue]
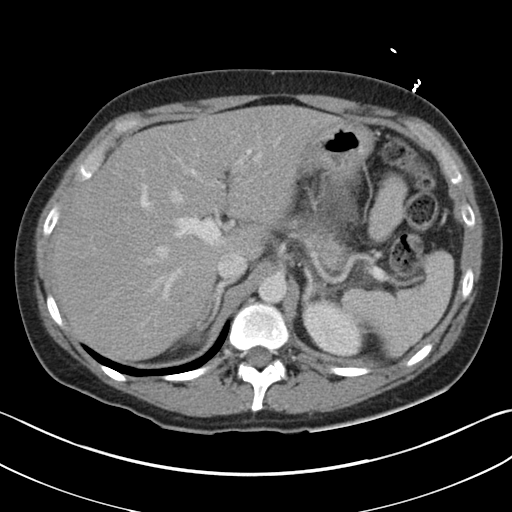
[im 76/87  soft-tissue]
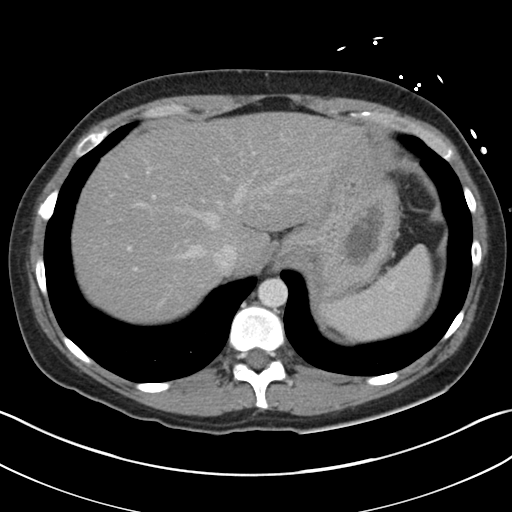
[im 81/87  soft-tissue]
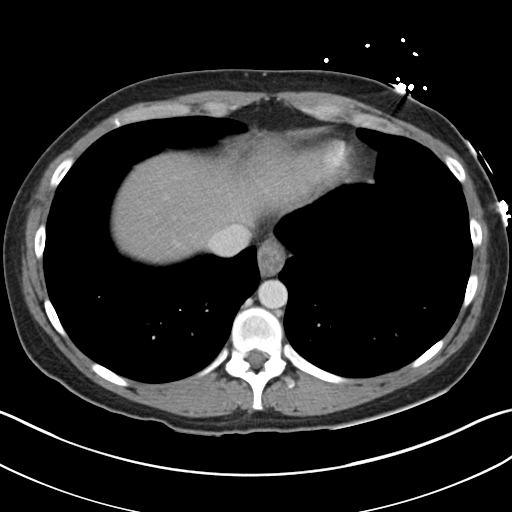

[Series 5: coronal a/|p · coronal · 0.74mm/px · 3 of 124 slices shown]
[im 42/124  soft-tissue]
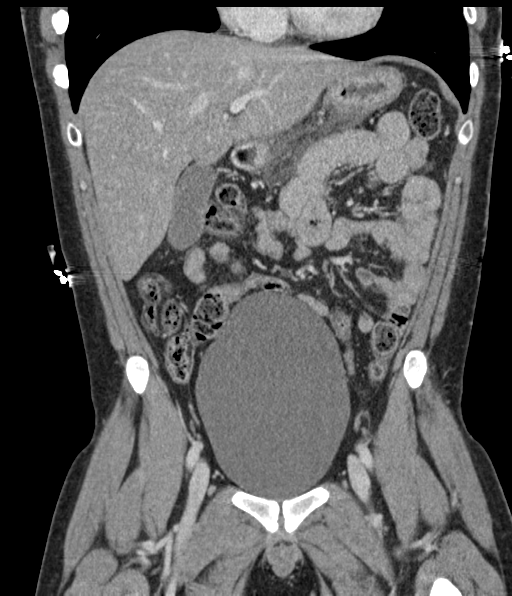
[im 55/124  soft-tissue]
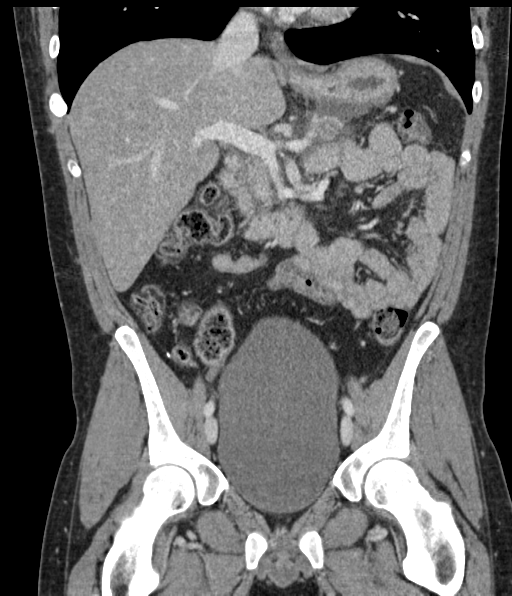
[im 69/124  soft-tissue]
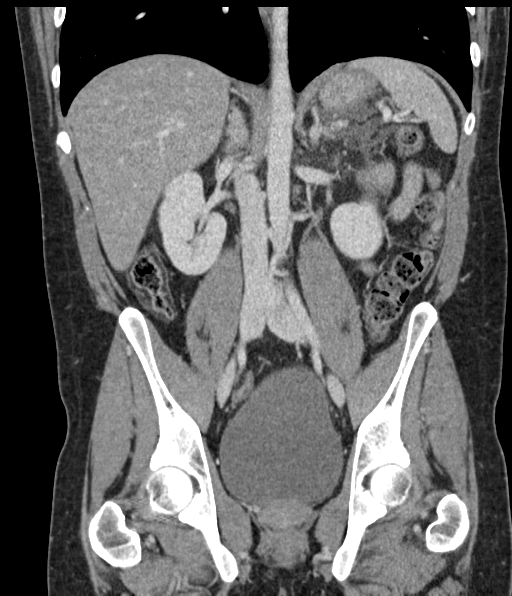

[16 of 46 positions shown; findings below may reference images not displayed]

FINDINGS: Lower chest: No acute abnormality.

Hepatobiliary: No focal liver abnormality is seen. No gallstones,
gallbladder wall thickening, or biliary dilatation.

Pancreas: Diffuse edema surrounding the pancreas compatible
pancreatitis. Focus of hypoattenuation within pancreatic body (less
than [DATE] represent necrosis. No peripancreatic collection
identified.

Spleen: Normal in size without focal abnormality.

Adrenals/Urinary Tract: Adrenal glands are unremarkable. Kidneys are
normal, without renal calculi, focal lesion, or hydronephrosis.
Marked distention of the bladder.

Stomach/Bowel: Stomach is within normal limits. Appendix not
identify. No evidence of bowel wall thickening, distention, or
inflammatory changes.

Vascular/Lymphatic: No significant vascular findings are present. No
enlarged abdominal or pelvic lymph nodes.

Reproductive: Prostate is unremarkable.

Other: No abdominal wall hernia or abnormality. No abdominopelvic
ascites.

Musculoskeletal: No acute or significant osseous findings.
IMPRESSION: Diffuse edema surrounding the pancreas compatible with acute
pancreatitis. Focus of hypoattenuation within pancreatic body (less
than [DATE] represent necrosis. No peripancreatic collection
identified.

By: Lourdes Ivonne Solitario M.D.

## 2017-05-15 ENCOUNTER — Other Ambulatory Visit: Payer: Self-pay | Admitting: Family Medicine

## 2017-05-18 ENCOUNTER — Other Ambulatory Visit: Payer: Self-pay | Admitting: Family Medicine

## 2017-05-18 DIAGNOSIS — F419 Anxiety disorder, unspecified: Principal | ICD-10-CM

## 2017-05-18 DIAGNOSIS — F329 Major depressive disorder, single episode, unspecified: Secondary | ICD-10-CM

## 2017-05-19 ENCOUNTER — Other Ambulatory Visit: Payer: Self-pay | Admitting: Pharmacist

## 2017-05-19 MED ORDER — INSULIN LISPRO 100 UNIT/ML (KWIKPEN): 0.0000 [IU] | PEN_INJECTOR | Freq: Three times a day (TID) | SUBCUTANEOUS | 0 refills | Status: DC

## 2017-06-02 ENCOUNTER — Ambulatory Visit: Payer: Managed Care, Other (non HMO) | Admitting: Family Medicine

## 2017-06-11 ENCOUNTER — Ambulatory Visit: Payer: Managed Care, Other (non HMO) | Admitting: Family Medicine

## 2017-07-01 ENCOUNTER — Other Ambulatory Visit: Payer: Self-pay | Admitting: Family Medicine

## 2017-07-01 DIAGNOSIS — E1010 Type 1 diabetes mellitus with ketoacidosis without coma: Secondary | ICD-10-CM

## 2017-07-08 ENCOUNTER — Other Ambulatory Visit: Payer: Self-pay | Admitting: Family Medicine

## 2017-07-31 ENCOUNTER — Other Ambulatory Visit: Payer: Self-pay | Admitting: Family Medicine

## 2017-07-31 DIAGNOSIS — E1010 Type 1 diabetes mellitus with ketoacidosis without coma: Secondary | ICD-10-CM

## 2017-07-31 DIAGNOSIS — I10 Essential (primary) hypertension: Secondary | ICD-10-CM

## 2017-09-18 ENCOUNTER — Ambulatory Visit: Payer: Managed Care, Other (non HMO) | Attending: Family Medicine | Admitting: Family Medicine

## 2017-09-18 ENCOUNTER — Encounter: Payer: Self-pay | Admitting: Family Medicine

## 2017-09-18 VITALS — BP 132/84 | HR 103 | Temp 97.8°F | Ht 65.0 in | Wt 187.0 lb

## 2017-09-18 DIAGNOSIS — Z79899 Other long term (current) drug therapy: Secondary | ICD-10-CM | POA: Diagnosis not present

## 2017-09-18 DIAGNOSIS — I10 Essential (primary) hypertension: Secondary | ICD-10-CM | POA: Diagnosis not present

## 2017-09-18 DIAGNOSIS — F329 Major depressive disorder, single episode, unspecified: Secondary | ICD-10-CM | POA: Diagnosis not present

## 2017-09-18 DIAGNOSIS — Z794 Long term (current) use of insulin: Secondary | ICD-10-CM | POA: Insufficient documentation

## 2017-09-18 DIAGNOSIS — E1042 Type 1 diabetes mellitus with diabetic polyneuropathy: Secondary | ICD-10-CM | POA: Insufficient documentation

## 2017-09-18 DIAGNOSIS — E1069 Type 1 diabetes mellitus with other specified complication: Secondary | ICD-10-CM | POA: Diagnosis not present

## 2017-09-18 DIAGNOSIS — F419 Anxiety disorder, unspecified: Secondary | ICD-10-CM | POA: Diagnosis not present

## 2017-09-18 DIAGNOSIS — E109 Type 1 diabetes mellitus without complications: Secondary | ICD-10-CM | POA: Diagnosis present

## 2017-09-18 LAB — GLUCOSE, POCT (MANUAL RESULT ENTRY)
POC GLUCOSE: 324 mg/dL — AB (ref 70–99)
POC Glucose: 311 mg/dl — AB (ref 70–99)

## 2017-09-18 MED ORDER — INSULIN GLARGINE 100 UNIT/ML SOLOSTAR PEN: 30.0000 [IU] | PEN_INJECTOR | Freq: Every day | SUBCUTANEOUS | 5 refills | Status: DC

## 2017-09-18 MED ORDER — INSULIN ASPART 100 UNIT/ML ~~LOC~~ SOLN
6.0000 [IU] | Freq: Once | SUBCUTANEOUS | Status: AC
  Administered 2017-09-18: 6 [IU] via SUBCUTANEOUS

## 2017-09-18 MED ORDER — LISINOPRIL 10 MG PO TABS: 10.0000 mg | ORAL_TABLET | Freq: Every day | ORAL | 5 refills | Status: DC

## 2017-09-18 MED ORDER — FLUOXETINE HCL 20 MG PO TABS: 20.0000 mg | ORAL_TABLET | Freq: Every day | ORAL | 5 refills | Status: DC

## 2017-09-18 MED ORDER — GABAPENTIN 300 MG PO CAPS
300.0000 mg | ORAL_CAPSULE | Freq: Two times a day (BID) | ORAL | 5 refills | Status: DC
Start: 2017-09-18 — End: 2018-04-12

## 2017-09-18 MED ORDER — ATORVASTATIN CALCIUM 20 MG PO TABS: 20.0000 mg | ORAL_TABLET | Freq: Every day | ORAL | 5 refills | Status: DC

## 2017-09-18 MED ORDER — INSULIN LISPRO 100 UNIT/ML (KWIKPEN): 0.0000 [IU] | PEN_INJECTOR | Freq: Three times a day (TID) | SUBCUTANEOUS | 0 refills | Status: DC

## 2017-09-18 NOTE — Progress Notes (Signed)
Subjective:  Patient ID: Christopher Robles, adult    DOB: Jun 28, 1990  Age: 27 y.o. MRN: [id number]  CC: Diabetes   HPI RUSSEL MORAIN is a  27 year old patient with a history of type 1 diabetes mellitus (A1c 8.6), hypertension, noncompliance, alcohol abuse who presents today for follow-up visit. He was last seen in 10/2016 and informs me his work schedule has precluded him from making an appointment; he works at [employer]. Lantus appears on his med list however he informs me that insurance did not cover it and so he had to take ReliOn and takes anywhere from 15 to 25 units of his rely on 3 times a day.  He noticed that when he takes 71 and is active at work, lifting heavy bags of food his sugar drops rapidly to 74 and he has to drink sodas. He also complains of numbness in his right arm and sometimes in his feet. He continues to drink alcohol but informs me he has cut back and drinks about 1 cans of beer every other day and no longer drinks liquor.  His whole family are alcoholics and it is difficult for him to quit; we offered him rehab in the past however he was not interested. His anxiety has been uncontrolled since he ran out of Prozac and he is requesting refills. He has also been out of his antihypertensive and is requesting refill.  Past Medical History:  Diagnosis Date  . Anxiety   . DM I (diabetes mellitus, type I), uncontrolled ([place])   . ETOH abuse   . Seizure [facility])     Past Surgical History:  Procedure Laterality Date  . APPENDECTOMY      No Known Allergies   Outpatient Medications Prior to Visit  Medication Sig Dispense Refill  . blood glucose meter kit and supplies Dispense based on patient and insurance preference. Use up to four times daily as directed. (FOR ICD-9 250.00, 250.01). 1 each 0  . Insulin Pen Needle 31G X 8 MM MISC Use 4 times daily with meals and at bedtime as directed. 120 each 5  . Multiple Vitamin (MULTIVITAMIN WITH MINERALS) TABS tablet Take 1 tablet by  mouth daily. 30 tablet 0  . TRUEPLUS INSULIN SYRINGE 30G X 5/16" 0.5 ML MISC USE TO INJECT INSULIN DAILY AS DIRECTED BY PHYSICIAN 100 each 12  . atorvastatin (LIPITOR) 20 MG tablet TAKE 1 TABLET BY MOUTH ONCE DAILY 30 tablet 0  . FLUoxetine (PROZAC) 20 MG tablet TAKE 1 TABLET BY MOUTH ONCE DAILY 30 tablet 5  . insulin lispro (HUMALOG KWIKPEN) 100 UNIT/ML KiwkPen Inject 0-0.12 mLs (0-12 Units total) into the skin 3 (three) times daily. Based on sliding scale 15 mL 0  . lisinopril (PRINIVIL,ZESTRIL) 10 MG tablet Take 1 tablet (10 mg total) by mouth daily. 30 tablet 5  . Insulin Glargine (LANTUS SOLOSTAR) 100 UNIT/ML Solostar Pen Inject 30 Units into the skin daily at 10 pm. (Patient not taking: Reported on 09/18/2017) 5 pen 5   No facility-administered medications prior to visit.     ROS Review of Systems  Constitutional: Negative for activity change, appetite change and fatigue.  HENT: Negative for congestion, sinus pressure and sore throat.   Eyes: Negative for visual disturbance.  Respiratory: Negative for cough, chest tightness, shortness of breath and wheezing.   Cardiovascular: Negative for chest pain and palpitations.  Gastrointestinal: Negative for abdominal distention, abdominal pain and constipation.  Endocrine: Negative for polydipsia.  Genitourinary: Negative for dysuria and frequency.  Musculoskeletal: Negative for arthralgias and back pain.  Skin: Negative for rash.  Neurological: Positive for numbness. Negative for tremors and light-headedness.  Hematological: Does not bruise/bleed easily.  Psychiatric/Behavioral: Negative for agitation and behavioral problems.    Objective:  BP 132/84   Pulse (!) 103   Temp 97.8 F (36.6 C) (Oral)   Ht '5\' 5"'$  (1.651 m)   Wt 187 lb (84.8 kg)   SpO2 99%   BMI 31.12 kg/m   BP/Weight 09/18/2017 11/11/2016 5/99/3570  Systolic BP 177 939 030  Diastolic BP 84 75 092  Wt. (Lbs) 187 167 170.4  BMI 31.12 27.79 28.36      Physical Exam    Constitutional: He is oriented to person, place, and time. He appears well-developed and well-nourished.  Cardiovascular: Normal rate, normal heart sounds and intact distal pulses.  No murmur heard. Pulmonary/Chest: Effort normal and breath sounds normal. He has no wheezes. He has no rales. He exhibits no tenderness.  Abdominal: Soft. Bowel sounds are normal. He exhibits no distension and no mass. There is no tenderness.  Musculoskeletal: Normal range of motion.  Neurological: He is alert and oriented to person, place, and time.  Skin: Skin is warm and dry.  Psychiatric: He has a normal mood and affect.    Lab Results  Component Value Date   HGBA1C 8.6 11/11/2016    Assessment & Plan:   1. Type 1 diabetes mellitus with other specified complication (HCC) Uncontrolled with A1c of 8.6 We will send a prescription for Lantus but meanwhile he has been advised to continue with his reliant until he obtains Lantus and in the event that Lantus is not covered by his insurance he is to notify the clinic so substitution can be made. Advised to comply with a diabetic diet, lifestyle modifications - POCT glucose (manual entry) - Hemoglobin A1c - insulin aspart (novoLOG) injection 6 Units - POCT glucose (manual entry)  2. Essential hypertension Controlled - lisinopril (PRINIVIL,ZESTRIL) 10 MG tablet; Take 1 tablet (10 mg total) by mouth daily.  Dispense: 30 tablet; Refill: 5 - CMP14+EGFR  3. Diabetic polyneuropathy associated with type 1 diabetes mellitus ([place]) Commenced on gabapentin - atorvastatin (LIPITOR) 20 MG tablet; Take 1 tablet (20 mg total) by mouth daily.  Dispense: 30 tablet; Refill: 5 - Insulin Glargine (LANTUS SOLOSTAR) 100 UNIT/ML Solostar Pen; Inject 30 Units into the skin daily at 10 pm.  Dispense: 5 pen; Refill: 5  4. Anxiety and depression Uncontrolled due to running out of Prozac which I have refilled. - FLUoxetine (PROZAC) 20 MG tablet; Take 1 tablet (20 mg total) by  mouth daily.  Dispense: 30 tablet; Refill: 5   Meds ordered this encounter  Medications  . gabapentin (NEURONTIN) 300 MG capsule    Sig: Take 1 capsule (300 mg total) by mouth 2 (two) times daily.    Dispense:  60 capsule    Refill:  5  . lisinopril (PRINIVIL,ZESTRIL) 10 MG tablet    Sig: Take 1 tablet (10 mg total) by mouth daily.    Dispense:  30 tablet    Refill:  5  . atorvastatin (LIPITOR) 20 MG tablet    Sig: Take 1 tablet (20 mg total) by mouth daily.    Dispense:  30 tablet    Refill:  5  . FLUoxetine (PROZAC) 20 MG tablet    Sig: Take 1 tablet (20 mg total) by mouth daily.    Dispense:  30 tablet    Refill:  5  Please consider 90 day supplies to promote better adherence  . Insulin Glargine (LANTUS SOLOSTAR) 100 UNIT/ML Solostar Pen    Sig: Inject 30 Units into the skin daily at 10 pm.    Dispense:  5 pen    Refill:  5  . insulin lispro (HUMALOG KWIKPEN) 100 UNIT/ML KiwkPen    Sig: Inject 0-0.12 mLs (0-12 Units total) into the skin 3 (three) times daily. Based on sliding scale    Dispense:  15 mL    Refill:  0    Must have office visit for refills - last refill  . insulin aspart (novoLOG) injection 6 Units    Follow-up: Return in about 3 months (around 12/19/2017), or diabetes.   Charlott Rakes MD

## 2017-09-18 NOTE — Patient Instructions (Signed)
Diabetes Mellitus and Nutrition When you have diabetes (diabetes mellitus), it is very important to have healthy eating habits because your blood sugar (glucose) levels are greatly affected by what you eat and drink. Eating healthy foods in the appropriate amounts, at about the same times every day, can help you:  Control your blood glucose.  Lower your risk of heart disease.  Improve your blood pressure.  Reach or maintain a healthy weight.  Every person with diabetes is different, and each person has different needs for a meal plan. Your health care provider may recommend that you work with a diet and nutrition specialist (dietitian) to make a meal plan that is best for you. Your meal plan may vary depending on factors such as:  The calories you need.  The medicines you take.  Your weight.  Your blood glucose, blood pressure, and cholesterol levels.  Your activity level.  Other health conditions you have, such as heart or kidney disease.  How do carbohydrates affect me? Carbohydrates affect your blood glucose level more than any other type of food. Eating carbohydrates naturally increases the amount of glucose in your blood. Carbohydrate counting is a method for keeping track of how many carbohydrates you eat. Counting carbohydrates is important to keep your blood glucose at a healthy level, especially if you use insulin or take certain oral diabetes medicines. It is important to know how many carbohydrates you can safely have in each meal. This is different for every person. Your dietitian can help you calculate how many carbohydrates you should have at each meal and for snack. Foods that contain carbohydrates include:  Bread, cereal, rice, pasta, and crackers.  Potatoes and corn.  Peas, beans, and lentils.  Milk and yogurt.  Fruit and juice.  Desserts, such as cakes, cookies, ice cream, and candy.  How does alcohol affect me? Alcohol can cause a sudden decrease in blood  glucose (hypoglycemia), especially if you use insulin or take certain oral diabetes medicines. Hypoglycemia can be a life-threatening condition. Symptoms of hypoglycemia (sleepiness, dizziness, and confusion) are similar to symptoms of having too much alcohol. If your health care provider says that alcohol is safe for you, follow these guidelines:  Limit alcohol intake to no more than 1 drink per day for nonpregnant women and 2 drinks per day for men. One drink equals 12 oz of beer, 5 oz of wine, or 1 oz of hard liquor.  Do not drink on an empty stomach.  Keep yourself hydrated with water, diet soda, or unsweetened iced tea.  Keep in mind that regular soda, juice, and other mixers may contain a lot of sugar and must be counted as carbohydrates.  What are tips for following this plan? Reading food labels  Start by checking the serving size on the label. The amount of calories, carbohydrates, fats, and other nutrients listed on the label are based on one serving of the food. Many foods contain more than one serving per package.  Check the total grams (g) of carbohydrates in one serving. You can calculate the number of servings of carbohydrates in one serving by dividing the total carbohydrates by 15. For example, if a food has 30 g of total carbohydrates, it would be equal to 2 servings of carbohydrates.  Check the number of grams (g) of saturated and trans fats in one serving. Choose foods that have low or no amount of these fats.  Check the number of milligrams (mg) of sodium in one serving. Most people   should limit total sodium intake to less than 2,300 mg per day.  Always check the nutrition information of foods labeled as "low-fat" or "nonfat". These foods may be higher in added sugar or refined carbohydrates and should be avoided.  Talk to your dietitian to identify your daily goals for nutrients listed on the label. Shopping  Avoid buying canned, premade, or processed foods. These  foods tend to be high in fat, sodium, and added sugar.  Shop around the outside edge of the grocery store. This includes fresh fruits and vegetables, bulk grains, fresh meats, and fresh dairy. Cooking  Use low-heat cooking methods, such as baking, instead of high-heat cooking methods like deep frying.  Cook using healthy oils, such as olive, canola, or sunflower oil.  Avoid cooking with butter, cream, or high-fat meats. Meal planning  Eat meals and snacks regularly, preferably at the same times every day. Avoid going long periods of time without eating.  Eat foods high in fiber, such as fresh fruits, vegetables, beans, and whole grains. Talk to your dietitian about how many servings of carbohydrates you can eat at each meal.  Eat 4-6 ounces of lean protein each day, such as lean meat, chicken, fish, eggs, or tofu. 1 ounce is equal to 1 ounce of meat, chicken, or fish, 1 egg, or 1/4 cup of tofu.  Eat some foods each day that contain healthy fats, such as avocado, nuts, seeds, and fish. Lifestyle   Check your blood glucose regularly.  Exercise at least 30 minutes 5 or more days each week, or as told by your health care provider.  Take medicines as told by your health care provider.  Do not use any products that contain nicotine or tobacco, such as cigarettes and e-cigarettes. If you need help quitting, ask your health care provider.  Work with a counselor or diabetes educator to identify strategies to manage stress and any emotional and social challenges. What are some questions to ask my health care provider?  Do I need to meet with a diabetes educator?  Do I need to meet with a dietitian?  What number can I call if I have questions?  When are the best times to check my blood glucose? Where to find more information:  American Diabetes Association: diabetes.org/food-and-fitness/food  Academy of Nutrition and Dietetics:  www.eatright.org/resources/health/diseases-and-conditions/diabetes  National Institute of Diabetes and Digestive and Kidney Diseases (NIH): www.niddk.nih.gov/health-information/diabetes/overview/diet-eating-physical-activity Summary  A healthy meal plan will help you control your blood glucose and maintain a healthy lifestyle.  Working with a diet and nutrition specialist (dietitian) can help you make a meal plan that is best for you.  Keep in mind that carbohydrates and alcohol have immediate effects on your blood glucose levels. It is important to count carbohydrates and to use alcohol carefully. This information is not intended to replace advice given to you by your health care provider. Make sure you discuss any questions you have with your health care provider. Document Released: 01/09/2005 Document Revised: 05/19/2016 Document Reviewed: 05/19/2016 Elsevier Interactive Patient Education  2018 Elsevier Inc.  

## 2017-09-19 LAB — CMP14+EGFR
A/G RATIO: 2 (ref 1.2–2.2)
ALBUMIN: 4.9 g/dL (ref 3.5–5.5)
ALT: 17 IU/L (ref 0–44)
AST: 19 IU/L (ref 0–40)
Alkaline Phosphatase: 120 IU/L — ABNORMAL HIGH (ref 39–117)
BILIRUBIN TOTAL: 0.4 mg/dL (ref 0.0–1.2)
BUN / CREAT RATIO: 12 (ref 9–20)
BUN: 11 mg/dL (ref 6–20)
CALCIUM: 10.7 mg/dL — AB (ref 8.7–10.2)
CHLORIDE: 97 mmol/L (ref 96–106)
CO2: 26 mmol/L (ref 20–29)
Creatinine, Ser: 0.92 mg/dL (ref 0.76–1.27)
GFR, EST AFRICAN AMERICAN: 131 mL/min/{1.73_m2} (ref >59)
GFR, EST NON AFRICAN AMERICAN: 114 mL/min/{1.73_m2} (ref >59)
GLOBULIN, TOTAL: 2.5 g/dL (ref 1.5–4.5)
Glucose: 350 mg/dL — ABNORMAL HIGH (ref 65–99)
POTASSIUM: 5.4 mmol/L — AB (ref 3.5–5.2)
SODIUM: 138 mmol/L (ref 134–144)
TOTAL PROTEIN: 7.4 g/dL (ref 6.0–8.5)

## 2017-09-19 LAB — HEMOGLOBIN A1C
Est. average glucose Bld gHb Est-mCnc: 197 mg/dL
Hgb A1c MFr Bld: 8.5 % — ABNORMAL HIGH (ref 4.8–5.6)

## 2017-09-22 ENCOUNTER — Other Ambulatory Visit: Payer: Self-pay | Admitting: Family Medicine

## 2017-09-22 MED ORDER — SODIUM POLYSTYRENE SULFONATE 15 GM/60ML PO SUSP: 15.0000 g | Freq: Once | ORAL | 0 refills | Status: AC

## 2017-09-23 ENCOUNTER — Telehealth: Payer: Self-pay | Admitting: *Deleted

## 2017-09-23 NOTE — Telephone Encounter (Signed)
[facility] pharmacy sent message to verify the quantity of the Kayexalate.  He is taking 60 mL.  Does it come in a smaller container or does he need to the 500 mls?

## 2017-09-24 ENCOUNTER — Telehealth: Payer: Self-pay

## 2017-09-24 ENCOUNTER — Other Ambulatory Visit: Payer: Self-pay

## 2017-09-24 MED ORDER — INSULIN DETEMIR 100 UNIT/ML ~~LOC~~ SOLN: 30.0000 [IU] | Freq: Every day | SUBCUTANEOUS | 11 refills | Status: DC

## 2017-09-24 NOTE — Telephone Encounter (Signed)
Patient was called and neither number on file is in service.   If patient call for lab results please inform patient:  Labs reveal an A1c of 8.5 which is above goal of less than 7. Please encourage compliance with his diabetic medications and diabetic diet. Potassium is also elevated and I have sent a prescription for Kayexalate to his pharmacy.

## 2017-09-24 NOTE — Telephone Encounter (Signed)
He needs a one-time dose of 60 mls

## 2017-10-16 ENCOUNTER — Other Ambulatory Visit: Payer: Self-pay | Admitting: Family Medicine

## 2017-10-19 MED ORDER — INSULIN LISPRO 100 UNIT/ML (KWIKPEN): 0.0000 [IU] | PEN_INJECTOR | Freq: Three times a day (TID) | SUBCUTANEOUS | 0 refills | Status: DC

## 2017-12-21 ENCOUNTER — Ambulatory Visit: Payer: Managed Care, Other (non HMO) | Admitting: Family Medicine

## 2018-01-28 ENCOUNTER — Telehealth: Payer: Self-pay | Admitting: Family Medicine

## 2018-01-28 NOTE — Telephone Encounter (Signed)
Patient would like to speak to the nurse. Patient did not disclose what for. Please follow up with patient.

## 2018-02-01 NOTE — Telephone Encounter (Signed)
Patient was called and there was no voicemail set up to leave a message. Nurse will attempt to contact patient another time.

## 2018-03-16 ENCOUNTER — Ambulatory Visit: Payer: Managed Care, Other (non HMO) | Admitting: Family Medicine

## 2018-04-12 ENCOUNTER — Ambulatory Visit: Payer: Managed Care, Other (non HMO) | Attending: Family Medicine | Admitting: Family Medicine

## 2018-04-12 ENCOUNTER — Encounter: Payer: Self-pay | Admitting: Family Medicine

## 2018-04-12 VITALS — BP 143/92 | HR 110 | Temp 98.0°F | Ht 65.0 in | Wt 193.0 lb

## 2018-04-12 DIAGNOSIS — R61 Generalized hyperhidrosis: Secondary | ICD-10-CM

## 2018-04-12 DIAGNOSIS — E1042 Type 1 diabetes mellitus with diabetic polyneuropathy: Secondary | ICD-10-CM | POA: Diagnosis not present

## 2018-04-12 DIAGNOSIS — F419 Anxiety disorder, unspecified: Secondary | ICD-10-CM | POA: Diagnosis not present

## 2018-04-12 DIAGNOSIS — E1010 Type 1 diabetes mellitus with ketoacidosis without coma: Secondary | ICD-10-CM

## 2018-04-12 DIAGNOSIS — N528 Other male erectile dysfunction: Secondary | ICD-10-CM

## 2018-04-12 DIAGNOSIS — N529 Male erectile dysfunction, unspecified: Secondary | ICD-10-CM | POA: Insufficient documentation

## 2018-04-12 DIAGNOSIS — F329 Major depressive disorder, single episode, unspecified: Secondary | ICD-10-CM

## 2018-04-12 DIAGNOSIS — Z9119 Patient's noncompliance with other medical treatment and regimen: Secondary | ICD-10-CM | POA: Insufficient documentation

## 2018-04-12 DIAGNOSIS — I10 Essential (primary) hypertension: Secondary | ICD-10-CM | POA: Insufficient documentation

## 2018-04-12 DIAGNOSIS — Z794 Long term (current) use of insulin: Secondary | ICD-10-CM | POA: Insufficient documentation

## 2018-04-12 DIAGNOSIS — F32A Depression, unspecified: Secondary | ICD-10-CM

## 2018-04-12 DIAGNOSIS — Z79899 Other long term (current) drug therapy: Secondary | ICD-10-CM | POA: Insufficient documentation

## 2018-04-12 LAB — POCT GLYCOSYLATED HEMOGLOBIN (HGB A1C): Hemoglobin A1C: 8.7 % — AB (ref 4.0–5.6)

## 2018-04-12 LAB — GLUCOSE, POCT (MANUAL RESULT ENTRY): POC GLUCOSE: 172 mg/dL — AB (ref 70–99)

## 2018-04-12 MED ORDER — ATORVASTATIN CALCIUM 20 MG PO TABS: 20.0000 mg | ORAL_TABLET | Freq: Every day | ORAL | 5 refills | Status: DC

## 2018-04-12 MED ORDER — SILDENAFIL CITRATE 50 MG PO TABS: 50.0000 mg | ORAL_TABLET | Freq: Every day | ORAL | 0 refills | Status: DC | PRN

## 2018-04-12 MED ORDER — INSULIN LISPRO (1 UNIT DIAL) 100 UNIT/ML (KWIKPEN): 0.0000 [IU] | PEN_INJECTOR | Freq: Three times a day (TID) | SUBCUTANEOUS | 6 refills | Status: DC

## 2018-04-12 MED ORDER — INSULIN GLARGINE 100 UNIT/ML SOLOSTAR PEN: 30.0000 [IU] | PEN_INJECTOR | Freq: Every day | SUBCUTANEOUS | 5 refills | Status: DC

## 2018-04-12 MED ORDER — FLUOXETINE HCL 20 MG PO TABS: 20.0000 mg | ORAL_TABLET | Freq: Every day | ORAL | 5 refills | Status: DC

## 2018-04-12 MED ORDER — LISINOPRIL 10 MG PO TABS: 10.0000 mg | ORAL_TABLET | Freq: Every day | ORAL | 5 refills | Status: DC

## 2018-04-12 MED ORDER — GABAPENTIN 300 MG PO CAPS: 300.0000 mg | ORAL_CAPSULE | Freq: Two times a day (BID) | ORAL | 5 refills | Status: DC

## 2018-04-12 NOTE — Progress Notes (Signed)
Subjective:  Patient ID: Christopher Robles, male    DOB: Mar 18, 1991  Age: 27 y.o. MRN: [id number]  CC: Diabetes   HPI Christopher Robles is a  27 year old patient with a history of type 1 diabetes mellitus (A1c 8.7), hypertension,anxiety, noncompliance, alcohol abuse who presents today for follow-up visit. He never picked up Lantus which was prescribed at his last visit but instead has been using Humalog 3 times daily which he adjusts up to 25 units depending on his sugar levels. He complains of erectile dysfunction for the last 2-3 months end even though he gets aroused he is unable to maintain an erection and this causes him much anxiety. He denies visual complaints, numbness in extremities or hypoglycemia. He complains of heat intolerance and excessive diaphoresis but this is not limited to certain times of the day.  Past Medical History:  Diagnosis Date  . Anxiety   . DM I (diabetes mellitus, type I), uncontrolled ([place])   . ETOH abuse   . Seizure [facility])     Past Surgical History:  Procedure Laterality Date  . APPENDECTOMY      No Known Allergies   Outpatient Medications Prior to Visit  Medication Sig Dispense Refill  . blood glucose meter kit and supplies Dispense based on patient and insurance preference. Use up to four times daily as directed. (FOR ICD-9 250.00, 250.01). 1 each 0  . Insulin Pen Needle 31G X 8 MM MISC Use 4 times daily with meals and at bedtime as directed. 120 each 5  . Multiple Vitamin (MULTIVITAMIN WITH MINERALS) TABS tablet Take 1 tablet by mouth daily. 30 tablet 0  . TRUEPLUS INSULIN SYRINGE 30G X 5/16" 0.5 ML MISC USE TO INJECT INSULIN DAILY AS DIRECTED BY PHYSICIAN 100 each 12  . atorvastatin (LIPITOR) 20 MG tablet Take 1 tablet (20 mg total) by mouth daily. 30 tablet 5  . FLUoxetine (PROZAC) 20 MG tablet Take 1 tablet (20 mg total) by mouth daily. 30 tablet 5  . gabapentin (NEURONTIN) 300 MG capsule Take 1 capsule (300 mg total) by mouth 2 (two) times daily.  60 capsule 5  . insulin lispro (HUMALOG KWIKPEN) 100 UNIT/ML KiwkPen Inject 0-0.12 mLs (0-12 Units total) into the skin 3 (three) times daily. Based on sliding scale 15 mL 0  . lisinopril (PRINIVIL,ZESTRIL) 10 MG tablet Take 1 tablet (10 mg total) by mouth daily. 30 tablet 5  . insulin detemir (LEVEMIR) 100 UNIT/ML injection Inject 0.3 mLs (30 Units total) into the skin at bedtime. (Patient not taking: Reported on 04/12/2018) 10 mL 11  . Insulin Glargine (LANTUS SOLOSTAR) 100 UNIT/ML Solostar Pen Inject 30 Units into the skin daily at 10 pm. (Patient not taking: Reported on 04/12/2018) 5 pen 5   No facility-administered medications prior to visit.     ROS Review of Systems  Constitutional: Negative for activity change and appetite change.  HENT: Negative for sinus pressure and sore throat.   Eyes: Negative for visual disturbance.  Respiratory: Negative for cough, chest tightness and shortness of breath.   Cardiovascular: Negative for chest pain and leg swelling.  Gastrointestinal: Negative for abdominal distention, abdominal pain, constipation and diarrhea.  Endocrine: Negative.   Genitourinary: Negative for dysuria.  Musculoskeletal: Negative for joint swelling and myalgias.  Skin: Negative for rash.  Allergic/Immunologic: Negative.   Neurological: Negative for weakness, light-headedness and numbness.  Psychiatric/Behavioral: Negative for dysphoric mood and suicidal ideas.    Objective:  BP (!) 143/92   Pulse (!) 110  Temp 98 F (36.7 C) (Oral)   Ht _0  (1.651 m)   Wt 193 lb (87.5 kg)   SpO2 98%   BMI 32.12 kg/m   BP/Weight 04/12/2018 09/18/2017 2/29/7989  Systolic BP 211 941 740  Diastolic BP 92 84 75  Wt. (Lbs) 193 187 167  BMI 32.12 31.12 27.79      Physical Exam Constitutional:      Appearance: He is well-developed.  Cardiovascular:     Rate and Rhythm: Tachycardia present.     Heart sounds: Normal heart sounds. No murmur.  Pulmonary:     Effort: Pulmonary  effort is normal.     Breath sounds: Normal breath sounds. No wheezing or rales.  Chest:     Chest wall: No tenderness.  Abdominal:     General: Bowel sounds are normal. There is no distension.     Palpations: Abdomen is soft. There is no mass.     Tenderness: There is no abdominal tenderness.  Musculoskeletal: Normal range of motion.  Neurological:     Mental Status: He is alert and oriented to person, place, and time.  Psychiatric:        Mood and Affect: Mood normal.        Behavior: Behavior normal.     Lab Results  Component Value Date   HGBA1C 8.7 (A) 04/12/2018    Assessment & Plan:   1. DM (diabetes mellitus) type 1, uncontrolled, with ketoacidosis ([place]) Uncontrolled with A1c of 8.7 Advised to pick up Lantus and use Humalog for meal time coverage - POCT glucose (manual entry) - POCT glycosylated hemoglobin (Hb A1C) - CMP14+EGFR - insulin lispro (HUMALOG KWIKPEN) 100 UNIT/ML KwikPen; Inject 0-0.12 mLs (0-12 Units total) into the skin 3 (three) times daily before meals.  Dispense: 30 mL; Refill: 6  2. Diaphoresis Will evaluate for thyroid disorder Cannot exclude diabetic autonomic neuropathy - TSH - T4, free  3. Diabetic polyneuropathy associated with type 1 diabetes mellitus (HCC) Stable - atorvastatin (LIPITOR) 20 MG tablet; Take 1 tablet (20 mg total) by mouth daily.  Dispense: 30 tablet; Refill: 5 - gabapentin (NEURONTIN) 300 MG capsule; Take 1 capsule (300 mg total) by mouth 2 (two) times daily.  Dispense: 60 capsule; Refill: 5 - Insulin Glargine (LANTUS SOLOSTAR) 100 UNIT/ML Solostar Pen; Inject 30 Units into the skin daily at 10 pm.  Dispense: 5 pen; Refill: 5  4. Anxiety and depression Stable - FLUoxetine (PROZAC) 20 MG tablet; Take 1 tablet (20 mg total) by mouth daily.  Dispense: 30 tablet; Refill: 5  5. Essential hypertension Slightly elevated BP No regimen changes - lifestyle modification - lisinopril (PRINIVIL,ZESTRIL) 10 MG tablet; Take 1 tablet  (10 mg total) by mouth daily.  Dispense: 30 tablet; Refill: 5  6. Other male erectile dysfunction - Testosterone, Free, Total, SHBG - sildenafil (VIAGRA) 50 MG tablet; Take 1 tablet (50 mg total) by mouth daily as needed for erectile dysfunction. At least 24 hours between doses  Dispense: 10 tablet; Refill: 0   Meds ordered this encounter  Medications  . atorvastatin (LIPITOR) 20 MG tablet    Sig: Take 1 tablet (20 mg total) by mouth daily.    Dispense:  30 tablet    Refill:  5  . FLUoxetine (PROZAC) 20 MG tablet    Sig: Take 1 tablet (20 mg total) by mouth daily.    Dispense:  30 tablet    Refill:  5    Please consider 90 day supplies to promote better  adherence  . gabapentin (NEURONTIN) 300 MG capsule    Sig: Take 1 capsule (300 mg total) by mouth 2 (two) times daily.    Dispense:  60 capsule    Refill:  5  . Insulin Glargine (LANTUS SOLOSTAR) 100 UNIT/ML Solostar Pen    Sig: Inject 30 Units into the skin daily at 10 pm.    Dispense:  5 pen    Refill:  5  . lisinopril (PRINIVIL,ZESTRIL) 10 MG tablet    Sig: Take 1 tablet (10 mg total) by mouth daily.    Dispense:  30 tablet    Refill:  5  . insulin lispro (HUMALOG KWIKPEN) 100 UNIT/ML KwikPen    Sig: Inject 0-0.12 mLs (0-12 Units total) into the skin 3 (three) times daily before meals.    Dispense:  30 mL    Refill:  6  . sildenafil (VIAGRA) 50 MG tablet    Sig: Take 1 tablet (50 mg total) by mouth daily as needed for erectile dysfunction. At least 24 hours between doses    Dispense:  10 tablet    Refill:  0    Follow-up: Return in about 3 months (around 07/12/2018) for follow up of chronic medical conditions.   Charlott Rakes MD

## 2018-04-14 ENCOUNTER — Telehealth: Payer: Self-pay

## 2018-04-14 ENCOUNTER — Other Ambulatory Visit: Payer: Self-pay | Admitting: Pharmacist

## 2018-04-14 LAB — TESTOSTERONE, FREE, TOTAL, SHBG
Sex Hormone Binding: 24.7 nmol/L (ref 16.5–55.9)
Testosterone, Free: 16.3 pg/mL (ref 9.3–26.5)
Testosterone: 279 ng/dL (ref 264–916)

## 2018-04-14 LAB — CMP14+EGFR
ALBUMIN: 4.4 g/dL (ref 3.5–5.5)
ALK PHOS: 122 IU/L — AB (ref 39–117)
ALT: 24 IU/L (ref 0–44)
AST: 26 IU/L (ref 0–40)
Albumin/Globulin Ratio: 2.1 (ref 1.2–2.2)
BUN / CREAT RATIO: 13 (ref 9–20)
BUN: 11 mg/dL (ref 6–20)
Bilirubin Total: 0.3 mg/dL (ref 0.0–1.2)
CALCIUM: 9.8 mg/dL (ref 8.7–10.2)
CO2: 24 mmol/L (ref 20–29)
CREATININE: 0.84 mg/dL (ref 0.76–1.27)
Chloride: 98 mmol/L (ref 96–106)
GFR calc Af Amer: 138 mL/min/{1.73_m2} (ref >59)
GFR, EST NON AFRICAN AMERICAN: 119 mL/min/{1.73_m2} (ref >59)
GLUCOSE: 141 mg/dL — AB (ref 65–99)
Globulin, Total: 2.1 g/dL (ref 1.5–4.5)
Potassium: 4.3 mmol/L (ref 3.5–5.2)
Sodium: 141 mmol/L (ref 134–144)
Total Protein: 6.5 g/dL (ref 6.0–8.5)

## 2018-04-14 LAB — T4, FREE: FREE T4: 0.96 ng/dL (ref 0.82–1.77)

## 2018-04-14 LAB — TSH: TSH: 2.05 u[IU]/mL (ref 0.450–4.500)

## 2018-04-14 MED ORDER — BASAGLAR KWIKPEN 100 UNIT/ML ~~LOC~~ SOPN: 30.0000 [IU] | PEN_INJECTOR | Freq: Every day | SUBCUTANEOUS | 5 refills | Status: DC

## 2018-04-14 NOTE — Telephone Encounter (Signed)
Patient was called and phone states that call can not be completed at this time.

## 2018-04-14 NOTE — Progress Notes (Signed)
Sent in for [employer] as this is preferred by insurance. Per auto-sub policy.

## 2018-04-14 NOTE — Telephone Encounter (Signed)
-----   Message from Hoy RegisterEnobong Newlin, MD sent at 04/14/2018  1:36 PM EST ----- Labs are normal

## 2018-04-19 NOTE — Telephone Encounter (Signed)
Patient was called and phone states that call can not be completed at this time. 

## 2018-04-19 NOTE — Telephone Encounter (Signed)
-----   Message from Enobong Newlin, MD sent at 04/14/2018  1:36 PM EST ----- Labs are normal 

## 2018-06-12 ENCOUNTER — Ambulatory Visit (HOSPITAL_COMMUNITY)
Admission: EM | Admit: 2018-06-12 | Discharge: 2018-06-12 | Discharge status: Against Medical Advice | Payer: Managed Care, Other (non HMO)

## 2018-06-12 NOTE — ED Notes (Signed)
Per registration, pt stated he was going to leave.

## 2018-06-17 ENCOUNTER — Ambulatory Visit: Payer: Managed Care, Other (non HMO) | Admitting: Internal Medicine

## 2018-07-14 ENCOUNTER — Ambulatory Visit: Payer: Managed Care, Other (non HMO) | Admitting: Family Medicine

## 2018-11-12 ENCOUNTER — Other Ambulatory Visit: Payer: Self-pay | Admitting: Family Medicine

## 2018-11-12 DIAGNOSIS — F32A Depression, unspecified: Secondary | ICD-10-CM

## 2018-11-12 DIAGNOSIS — F329 Major depressive disorder, single episode, unspecified: Secondary | ICD-10-CM

## 2018-12-10 ENCOUNTER — Other Ambulatory Visit: Payer: Self-pay | Admitting: Family Medicine

## 2018-12-10 DIAGNOSIS — F329 Major depressive disorder, single episode, unspecified: Secondary | ICD-10-CM

## 2018-12-10 DIAGNOSIS — F32A Depression, unspecified: Secondary | ICD-10-CM

## 2019-01-19 ENCOUNTER — Other Ambulatory Visit: Payer: Self-pay

## 2019-01-19 ENCOUNTER — Encounter: Payer: Self-pay | Admitting: Family Medicine

## 2019-01-19 ENCOUNTER — Ambulatory Visit: Payer: Managed Care, Other (non HMO) | Attending: Family Medicine | Admitting: Family Medicine

## 2019-01-19 DIAGNOSIS — Z794 Long term (current) use of insulin: Secondary | ICD-10-CM

## 2019-01-19 DIAGNOSIS — I10 Essential (primary) hypertension: Secondary | ICD-10-CM

## 2019-01-19 DIAGNOSIS — E1165 Type 2 diabetes mellitus with hyperglycemia: Secondary | ICD-10-CM

## 2019-01-19 DIAGNOSIS — N528 Other male erectile dysfunction: Secondary | ICD-10-CM

## 2019-01-19 DIAGNOSIS — F419 Anxiety disorder, unspecified: Secondary | ICD-10-CM

## 2019-01-19 DIAGNOSIS — F329 Major depressive disorder, single episode, unspecified: Secondary | ICD-10-CM

## 2019-01-19 DIAGNOSIS — F32A Depression, unspecified: Secondary | ICD-10-CM

## 2019-01-19 DIAGNOSIS — E1042 Type 1 diabetes mellitus with diabetic polyneuropathy: Secondary | ICD-10-CM | POA: Diagnosis not present

## 2019-01-19 MED ORDER — SILDENAFIL CITRATE 100 MG PO TABS: 100.0000 mg | ORAL_TABLET | Freq: Every day | ORAL | 1 refills | Status: DC | PRN

## 2019-01-19 MED ORDER — BASAGLAR KWIKPEN 100 UNIT/ML ~~LOC~~ SOPN: 30.0000 [IU] | PEN_INJECTOR | Freq: Every day | SUBCUTANEOUS | 5 refills | Status: DC

## 2019-01-19 MED ORDER — LISINOPRIL 10 MG PO TABS
10.0000 mg | ORAL_TABLET | Freq: Every day | ORAL | 5 refills | Status: DC
Start: 2019-01-19 — End: 2019-06-28

## 2019-01-19 MED ORDER — ATORVASTATIN CALCIUM 20 MG PO TABS: 20.0000 mg | ORAL_TABLET | Freq: Every day | ORAL | 5 refills | Status: DC

## 2019-01-19 MED ORDER — INSULIN LISPRO (1 UNIT DIAL) 100 UNIT/ML (KWIKPEN): 0.0000 [IU] | PEN_INJECTOR | Freq: Three times a day (TID) | SUBCUTANEOUS | 6 refills | Status: DC

## 2019-01-19 MED ORDER — FLUOXETINE HCL 20 MG PO TABS: 20.0000 mg | ORAL_TABLET | Freq: Every day | ORAL | 5 refills | Status: DC

## 2019-01-19 MED ORDER — GABAPENTIN 300 MG PO CAPS: 300.0000 mg | ORAL_CAPSULE | Freq: Two times a day (BID) | ORAL | 5 refills | Status: DC

## 2019-01-19 NOTE — Progress Notes (Signed)
Patient has been called and DOB has been verified. Patient has been screened and transferred to PCP to start phone visit.  Patient needs refill on medications. 

## 2019-01-19 NOTE — Progress Notes (Signed)
Virtual Visit via Telephone Note  I connected with Christopher Robles, on 01/19/2019 at 1:32 PM by telephone due to the COVID-19 pandemic and verified that I am speaking with the correct person using two identifiers.   Consent: I discussed the limitations, risks, security and privacy concerns of performing an evaluation and management service by telephone and the availability of in person appointments. I also discussed with the patient that there may be a patient responsible charge related to this service. The patient expressed understanding and agreed to proceed.   Location of Patient: Home  Location of Provider: Clinic   Persons participating in Telemedicine visit: Adib Wahba Farrington-CMA Dr. Felecia Shelling     History of Present Illness: Christopher Robles is a  28 year old patient with a history of type 1 diabetes mellitus (A1c 8.7), hypertension,anxiety, noncompliance, alcohol abuse who presents today for follow-up visit. Fasting sugars are 120-150, random 250. He has been compliant with his insulin He has cut back on his drinking. Viagra did not help him with erection and he is currently on 50 mg. Compliant with his antihypertensive and statin with no adverse effects from his medications.  Anxiety is controlled on Prozac.  Denies depressive symptoms, suicidal ideations or intent. He has picked up several hobbies in like painting to relieve stress; also walks regularly. Declines a flu shot.  Past Medical History:  Diagnosis Date  . Anxiety   . DM I (diabetes mellitus, type I), uncontrolled ([place])   . ETOH abuse   . Seizure ([place])    No Known Allergies  Current Outpatient Medications on File Prior to Visit  Medication Sig Dispense Refill  . atorvastatin (LIPITOR) 20 MG tablet Take 1 tablet (20 mg total) by mouth daily. 30 tablet 5  . blood glucose meter kit and supplies Dispense based on patient and insurance preference. Use up to four times daily as directed. (FOR ICD-9  250.00, 250.01). 1 each 0  . FLUoxetine (PROZAC) 20 MG tablet Take 1 tablet (20 mg total) by mouth daily. MUST MAKE APPT FOR FURTHER REFILLS 30 tablet 0  . gabapentin (NEURONTIN) 300 MG capsule Take 1 capsule (300 mg total) by mouth 2 (two) times daily. 60 capsule 5  . Insulin Glargine (BASAGLAR KWIKPEN) 100 UNIT/ML SOPN Inject 0.3 mLs (30 Units total) into the skin at bedtime. 15 mL 5  . insulin lispro (HUMALOG KWIKPEN) 100 UNIT/ML KwikPen Inject 0-0.12 mLs (0-12 Units total) into the skin 3 (three) times daily before meals. 30 mL 6  . Insulin Pen Needle 31G X 8 MM MISC Use 4 times daily with meals and at bedtime as directed. 120 each 5  . lisinopril (PRINIVIL,ZESTRIL) 10 MG tablet Take 1 tablet (10 mg total) by mouth daily. 30 tablet 5  . Multiple Vitamin (MULTIVITAMIN WITH MINERALS) TABS tablet Take 1 tablet by mouth daily. 30 tablet 0  . sildenafil (VIAGRA) 50 MG tablet Take 1 tablet (50 mg total) by mouth daily as needed for erectile dysfunction. At least 24 hours between doses 10 tablet 0  . TRUEPLUS INSULIN SYRINGE 30G X 5/16" 0.5 ML MISC USE TO INJECT INSULIN DAILY AS DIRECTED BY PHYSICIAN 100 each 12   No current facility-administered medications on file prior to visit.     Observations/Objective: Awake, alert, oriented x3 Not in acute distress  CMP Latest Ref Rng & Units 04/12/2018 09/18/2017 03/23/2016  Glucose 65 - 99 mg/dL 141(H) 350(H) 290(H)  BUN 6 - 20 mg/dL '11 11 7  '$ Creatinine 0.76 -  1.27 mg/dL 0.84 0.92 0.87  Sodium 134 - 144 mmol/L 141 138 132(L)  Potassium 3.5 - 5.2 mmol/L 4.3 5.4(H) 3.6  Chloride 96 - 106 mmol/L 98 97 99(L)  CO2 20 - 29 mmol/L 24 26 18(L)  Calcium 8.7 - 10.2 mg/dL 9.8 10.7(H) 8.2(L)  Total Protein 6.0 - 8.5 g/dL 6.5 7.4 6.7  Total Bilirubin 0.0 - 1.2 mg/dL 0.3 0.4 1.8(H)  Alkaline Phos 39 - 117 IU/L 122(H) 120(H) 78  AST 0 - 40 IU/L '26 19 24  '$ ALT 0 - 44 IU/L '24 17 21    '$ Lipid Panel     Component Value Date/Time   CHOL 175 03/27/2015 0938    TRIG 459 (H) 03/27/2015 0938   HDL 84 03/27/2015 0938   CHOLHDL 2.1 03/27/2015 0938   VLDL NOT CALC 03/27/2015 0938   LDLCALC NOT CALC 03/27/2015 0938    Lab Results  Component Value Date   HGBA1C 8.7 (A) 04/12/2018     Assessment and Plan: Awake, alert, oriented x3 Not in acute distress   CMP Latest Ref Rng & Units 04/12/2018 09/18/2017 03/23/2016  Glucose 65 - 99 mg/dL 141(H) 350(H) 290(H)  BUN 6 - 20 mg/dL '11 11 7  '$ Creatinine 0.76 - 1.27 mg/dL 0.84 0.92 0.87  Sodium 134 - 144 mmol/L 141 138 132(L)  Potassium 3.5 - 5.2 mmol/L 4.3 5.4(H) 3.6  Chloride 96 - 106 mmol/L 98 97 99(L)  CO2 20 - 29 mmol/L 24 26 18(L)  Calcium 8.7 - 10.2 mg/dL 9.8 10.7(H) 8.2(L)  Total Protein 6.0 - 8.5 g/dL 6.5 7.4 6.7  Total Bilirubin 0.0 - 1.2 mg/dL 0.3 0.4 1.8(H)  Alkaline Phos 39 - 117 IU/L 122(H) 120(H) 78  AST 0 - 40 IU/L '26 19 24  '$ ALT 0 - 44 IU/L '24 17 21    '$ Lipid Panel     Component Value Date/Time   CHOL 175 03/27/2015 0938   TRIG 459 (H) 03/27/2015 0938   HDL 84 03/27/2015 0938   CHOLHDL 2.1 03/27/2015 0938   VLDL NOT CALC 03/27/2015 0938   LDLCALC NOT CALC 03/27/2015 0938    Lab Results  Component Value Date   HGBA1C 8.7 (A) 04/12/2018      Follow Up Instructions: 1. Type 2 diabetes mellitus with hyperglycemia, with long-term current use of insulin (HCC) Uncontrolled with A1c of 8.7 We will check A1c and adjust regimen accordingly Counseled on Diabetic diet, my plate method, 768 minutes of moderate intensity exercise/week Keep blood sugar logs with fasting goals of 80-120 mg/dl, random of less than 180 and in the event of sugars less than 60 mg/dl or greater than 400 mg/dl please notify the clinic ASAP. It is recommended that you undergo annual eye exams and annual foot exams. Pneumonia vaccine is recommended. - insulin lispro (HUMALOG KWIKPEN) 100 UNIT/ML KwikPen; Inject 0-0.12 mLs (0-12 Units total) into the skin 3 (three) times daily before meals.  Dispense: 30 mL;  Refill: 6  2. Other male erectile dysfunction Uncontrolled Increase Viagra dose - sildenafil (VIAGRA) 100 MG tablet; Take 1 tablet (100 mg total) by mouth daily as needed for erectile dysfunction. At least 24 hours between doses  Dispense: 10 tablet; Refill: 1  3. Anxiety and depression Controlled - FLUoxetine (PROZAC) 20 MG tablet; Take 1 tablet (20 mg total) by mouth daily.  Dispense: 30 tablet; Refill: 5  4. Diabetic polyneuropathy associated with type 1 diabetes mellitus (HCC) Diabetic neuropathy is controlled - CMP14+EGFR; Future - Lipid panel; Future - Microalbumin /  creatinine urine ratio; Future - Hemoglobin A1c; Future - Ambulatory referral to Ophthalmology - atorvastatin (LIPITOR) 20 MG tablet; Take 1 tablet (20 mg total) by mouth daily.  Dispense: 30 tablet; Refill: 5 - gabapentin (NEURONTIN) 300 MG capsule; Take 1 capsule (300 mg total) by mouth 2 (two) times daily.  Dispense: 60 capsule; Refill: 5 - Insulin Glargine (BASAGLAR KWIKPEN) 100 UNIT/ML SOPN; Inject 0.3 mLs (30 Units total) into the skin at bedtime.  Dispense: 15 mL; Refill: 5  5. Essential hypertension Stable Advised to check blood pressure and pharmacies at home to obtain home blood pressure readings Counseled on blood pressure goal of less than 130/80, low-sodium, DASH diet, medication compliance, 150 minutes of moderate intensity exercise per week. Discussed medication compliance, adverse effects. - lisinopril (ZESTRIL) 10 MG tablet; Take 1 tablet (10 mg total) by mouth daily.  Dispense: 30 tablet; Refill: 5    I discussed the assessment and treatment plan with the patient. The patient was provided an opportunity to ask questions and all were answered. The patient agreed with the plan and demonstrated an understanding of the instructions.   The patient was advised to call back or seek an in-person evaluation if the symptoms worsen or if the condition fails to improve as anticipated.     I provided 25  minutes total of non-face-to-face time during this encounter including median intraservice time, reviewing previous notes, labs, imaging, medications, management and patient verbalized understanding.     Charlott Rakes, MD, FAAFP. [facility] and [place], [place]   01/19/2019, 1:32 PM

## 2019-01-20 ENCOUNTER — Other Ambulatory Visit: Payer: Self-pay

## 2019-01-20 ENCOUNTER — Ambulatory Visit: Payer: Managed Care, Other (non HMO) | Attending: Family Medicine

## 2019-01-20 DIAGNOSIS — E1042 Type 1 diabetes mellitus with diabetic polyneuropathy: Secondary | ICD-10-CM

## 2019-01-21 ENCOUNTER — Other Ambulatory Visit: Payer: Self-pay | Admitting: Family Medicine

## 2019-01-21 DIAGNOSIS — E1042 Type 1 diabetes mellitus with diabetic polyneuropathy: Secondary | ICD-10-CM

## 2019-01-21 LAB — CMP14+EGFR
ALT: 17 IU/L (ref 0–44)
AST: 18 IU/L (ref 0–40)
Albumin/Globulin Ratio: 1.6 (ref 1.2–2.2)
Albumin: 4.7 g/dL (ref 4.1–5.2)
Alkaline Phosphatase: 154 IU/L — ABNORMAL HIGH (ref 39–117)
BUN/Creatinine Ratio: 12 (ref 9–20)
BUN: 11 mg/dL (ref 6–20)
Bilirubin Total: 0.4 mg/dL (ref 0.0–1.2)
CO2: 27 mmol/L (ref 20–29)
Calcium: 10.4 mg/dL — ABNORMAL HIGH (ref 8.7–10.2)
Chloride: 95 mmol/L — ABNORMAL LOW (ref 96–106)
Creatinine, Ser: 0.93 mg/dL (ref 0.76–1.27)
GFR calc Af Amer: 128 mL/min/{1.73_m2} (ref >59)
GFR calc non Af Amer: 111 mL/min/{1.73_m2} (ref >59)
Globulin, Total: 2.9 g/dL (ref 1.5–4.5)
Glucose: 171 mg/dL — ABNORMAL HIGH (ref 65–99)
Potassium: 5.1 mmol/L (ref 3.5–5.2)
Sodium: 137 mmol/L (ref 134–144)
Total Protein: 7.6 g/dL (ref 6.0–8.5)

## 2019-01-21 LAB — LIPID PANEL
Chol/HDL Ratio: 5.3 ratio — ABNORMAL HIGH (ref 0.0–5.0)
Cholesterol, Total: 287 mg/dL — ABNORMAL HIGH (ref 100–199)
HDL: 54 mg/dL (ref >39)
LDL Chol Calc (NIH): 122 mg/dL — ABNORMAL HIGH (ref 0–99)
Triglycerides: 620 mg/dL (ref 0–149)
VLDL Cholesterol Cal: 111 mg/dL — ABNORMAL HIGH (ref 5–40)

## 2019-01-21 LAB — MICROALBUMIN / CREATININE URINE RATIO
Creatinine, Urine: 243 mg/dL
Microalb/Creat Ratio: 13 mg/g creat (ref 0–29)
Microalbumin, Urine: 31.7 ug/mL

## 2019-01-21 LAB — HEMOGLOBIN A1C
Est. average glucose Bld gHb Est-mCnc: 220 mg/dL
Hgb A1c MFr Bld: 9.3 % — ABNORMAL HIGH (ref 4.8–5.6)

## 2019-01-21 MED ORDER — ATORVASTATIN CALCIUM 40 MG PO TABS: 40.0000 mg | ORAL_TABLET | Freq: Every day | ORAL | 6 refills | Status: DC

## 2019-01-21 MED ORDER — BASAGLAR KWIKPEN 100 UNIT/ML ~~LOC~~ SOPN: 35.0000 [IU] | PEN_INJECTOR | Freq: Every day | SUBCUTANEOUS | 5 refills | Status: DC

## 2019-01-26 ENCOUNTER — Telehealth: Payer: Self-pay

## 2019-01-26 NOTE — Telephone Encounter (Signed)
Patient was called and a voicemail was left informing patient to return phone call for lab results. 

## 2019-01-26 NOTE — Telephone Encounter (Signed)
-----   Message from Enobong Newlin, MD sent at 01/21/2019 11:56 AM EDT ----- A1c is elevated at 9.3, I have increased Lantus to 35 units. Cholesterol is still elevated and so I have raised Lipitor to 40mg. Please advise to adhere to a low cholesterol, diabetic diet 

## 2019-01-28 ENCOUNTER — Telehealth: Payer: Self-pay

## 2019-01-28 NOTE — Telephone Encounter (Signed)
Patient was called and a voicemail was left informing patient to return phone call for lab results. 

## 2019-01-28 NOTE — Telephone Encounter (Signed)
-----   Message from Charlott Rakes, MD sent at 01/21/2019 11:56 AM EDT ----- A1c is elevated at 9.3, I have increased Lantus to 35 units. Cholesterol is still elevated and so I have raised Lipitor to 40mg . Please advise to adhere to a low cholesterol, diabetic diet

## 2019-06-28 ENCOUNTER — Other Ambulatory Visit: Payer: Self-pay

## 2019-06-28 ENCOUNTER — Encounter: Payer: Self-pay | Admitting: Family Medicine

## 2019-06-28 ENCOUNTER — Ambulatory Visit: Payer: Managed Care, Other (non HMO) | Attending: Family Medicine | Admitting: Family Medicine

## 2019-06-28 VITALS — BP 145/100 | HR 111 | Ht 65.0 in | Wt 190.0 lb

## 2019-06-28 DIAGNOSIS — N528 Other male erectile dysfunction: Secondary | ICD-10-CM | POA: Diagnosis not present

## 2019-06-28 DIAGNOSIS — R000 Tachycardia, unspecified: Secondary | ICD-10-CM | POA: Insufficient documentation

## 2019-06-28 DIAGNOSIS — F32A Depression, unspecified: Secondary | ICD-10-CM

## 2019-06-28 DIAGNOSIS — F419 Anxiety disorder, unspecified: Secondary | ICD-10-CM | POA: Diagnosis not present

## 2019-06-28 DIAGNOSIS — E1065 Type 1 diabetes mellitus with hyperglycemia: Secondary | ICD-10-CM

## 2019-06-28 DIAGNOSIS — Z79899 Other long term (current) drug therapy: Secondary | ICD-10-CM | POA: Diagnosis not present

## 2019-06-28 DIAGNOSIS — E1042 Type 1 diabetes mellitus with diabetic polyneuropathy: Secondary | ICD-10-CM | POA: Insufficient documentation

## 2019-06-28 DIAGNOSIS — F329 Major depressive disorder, single episode, unspecified: Secondary | ICD-10-CM | POA: Diagnosis not present

## 2019-06-28 DIAGNOSIS — Z794 Long term (current) use of insulin: Secondary | ICD-10-CM | POA: Diagnosis not present

## 2019-06-28 DIAGNOSIS — E785 Hyperlipidemia, unspecified: Secondary | ICD-10-CM

## 2019-06-28 DIAGNOSIS — I10 Essential (primary) hypertension: Secondary | ICD-10-CM | POA: Insufficient documentation

## 2019-06-28 DIAGNOSIS — E1069 Type 1 diabetes mellitus with other specified complication: Secondary | ICD-10-CM

## 2019-06-28 LAB — POCT GLYCOSYLATED HEMOGLOBIN (HGB A1C): HbA1c, POC (controlled diabetic range): 8.6 % — AB (ref 0.0–7.0)

## 2019-06-28 LAB — GLUCOSE, POCT (MANUAL RESULT ENTRY): POC Glucose: 253 mg/dl — AB (ref 70–99)

## 2019-06-28 MED ORDER — ATORVASTATIN CALCIUM 40 MG PO TABS: 40.0000 mg | ORAL_TABLET | Freq: Every day | ORAL | 6 refills | Status: DC

## 2019-06-28 MED ORDER — SILDENAFIL CITRATE 100 MG PO TABS: 100.0000 mg | ORAL_TABLET | Freq: Every day | ORAL | 1 refills | Status: DC | PRN

## 2019-06-28 MED ORDER — BASAGLAR KWIKPEN 100 UNIT/ML ~~LOC~~ SOPN: 38.0000 [IU] | PEN_INJECTOR | Freq: Every day | SUBCUTANEOUS | 5 refills | Status: DC

## 2019-06-28 MED ORDER — DULOXETINE HCL 60 MG PO CPEP: 60.0000 mg | ORAL_CAPSULE | Freq: Every day | ORAL | 6 refills | Status: DC

## 2019-06-28 MED ORDER — HYDROXYZINE HCL 25 MG PO TABS: 25.0000 mg | ORAL_TABLET | Freq: Three times a day (TID) | ORAL | 1 refills | Status: DC | PRN

## 2019-06-28 MED ORDER — LISINOPRIL-HYDROCHLOROTHIAZIDE 10-12.5 MG PO TABS: 1.0000 | ORAL_TABLET | Freq: Every day | ORAL | 6 refills | Status: DC

## 2019-06-28 NOTE — Progress Notes (Signed)
Subjective:  Patient ID: Christopher Robles, male    DOB: 1990/09/15  Age: 29 y.o. MRN: [id number]  CC: Diabetes   HPI Christopher Robles is a 29 year old patient with a history of type 1 diabetes mellitus (A1c 8.7), hypertension here for follow-up visit.  His BP is high and he states he is anxious due to being in the Clinic but endorses compliance with lisinopril.  He gets irritable at work; when he is home he is depressed but on going out he gets nervous such as when he is in the grocery store. He was on Prozac which he stopped as it was ineffective.  Denies suicidal ideations or intent and declines referral for psychotherapy. Complains Viagra is ineffective and he needs to increase the dose but review of his chart indicates he is on 100 mg.  With regards to Diabetes his sugars have been in the 300s sometimes and he has been compliant with his current regimen of Lantus and NovoLog.    He does not need Gabapentin as his numbness has resolved; denies visual concerns.  He drinks 3-4 beers/week and states he has cut down compared to previously. Past Medical History:  Diagnosis Date  . Anxiety   . DM I (diabetes mellitus, type I), uncontrolled ([place])   . ETOH abuse   . Seizure [facility])     Past Surgical History:  Procedure Laterality Date  . APPENDECTOMY      Family History  Problem Relation Age of Onset  . Cancer Maternal Aunt   . Cancer Maternal Uncle   . Heart disease Paternal Grandfather   . Anxiety disorder Mother   . Bipolar disorder Brother     No Known Allergies  Outpatient Medications Prior to Visit  Medication Sig Dispense Refill  . atorvastatin (LIPITOR) 40 MG tablet Take 1 tablet (40 mg total) by mouth daily. 30 tablet 6  . blood glucose meter kit and supplies Dispense based on patient and insurance preference. Use up to four times daily as directed. (FOR ICD-9 250.00, 250.01). 1 each 0  . FLUoxetine (PROZAC) 20 MG tablet Take 1 tablet (20 mg total) by mouth daily. 30 tablet  5  . gabapentin (NEURONTIN) 300 MG capsule Take 1 capsule (300 mg total) by mouth 2 (two) times daily. 60 capsule 5  . Insulin Glargine (BASAGLAR KWIKPEN) 100 UNIT/ML SOPN Inject 0.35 mLs (35 Units total) into the skin at bedtime. 30 mL 5  . insulin lispro (HUMALOG KWIKPEN) 100 UNIT/ML KwikPen Inject 0-0.12 mLs (0-12 Units total) into the skin 3 (three) times daily before meals. 30 mL 6  . Insulin Pen Needle 31G X 8 MM MISC Use 4 times daily with meals and at bedtime as directed. 120 each 5  . lisinopril (ZESTRIL) 10 MG tablet Take 1 tablet (10 mg total) by mouth daily. 30 tablet 5  . Multiple Vitamin (MULTIVITAMIN WITH MINERALS) TABS tablet Take 1 tablet by mouth daily. 30 tablet 0  . sildenafil (VIAGRA) 100 MG tablet Take 1 tablet (100 mg total) by mouth daily as needed for erectile dysfunction. At least 24 hours between doses 10 tablet 1  . TRUEPLUS INSULIN SYRINGE 30G X 5/16" 0.5 ML MISC USE TO INJECT INSULIN DAILY AS DIRECTED BY PHYSICIAN 100 each 12   No facility-administered medications prior to visit.     ROS Review of Systems  Constitutional: Negative for activity change and appetite change.  HENT: Negative for sinus pressure and sore throat.   Eyes: Negative for visual disturbance.  Respiratory: Negative for cough, chest tightness and shortness of breath.   Cardiovascular: Negative for chest pain and leg swelling.  Gastrointestinal: Negative for abdominal distention, abdominal pain, constipation and diarrhea.  Endocrine: Negative.   Genitourinary: Negative for dysuria.  Musculoskeletal: Negative for joint swelling and myalgias.  Skin: Negative for rash.  Allergic/Immunologic: Negative.   Neurological: Negative for weakness, light-headedness and numbness.  Psychiatric/Behavioral: Negative for dysphoric mood and suicidal ideas.       Anxious    Objective:  BP (!) 145/100   Pulse (!) 111   Ht '5\' 5"'$  (1.651 m)   Wt 190 lb (86.2 kg)   SpO2 99%   BMI 31.62 kg/m   BP/Weight  06/28/2019 04/12/2018 8/34/1962  Systolic BP 229 798 921  Diastolic BP 194 92 84  Wt. (Lbs) 190 193 187  BMI 31.62 32.12 31.12      Physical Exam Constitutional:      Appearance: He is well-developed.  Neck:     Vascular: No JVD.  Cardiovascular:     Rate and Rhythm: Tachycardia present.     Heart sounds: Normal heart sounds. No murmur.  Pulmonary:     Effort: Pulmonary effort is normal.     Breath sounds: Normal breath sounds. No wheezing or rales.  Chest:     Chest wall: No tenderness.  Abdominal:     General: Bowel sounds are normal. There is no distension.     Palpations: Abdomen is soft. There is no mass.     Tenderness: There is no abdominal tenderness.  Musculoskeletal:        General: Normal range of motion.     Right lower leg: No edema.     Left lower leg: No edema.  Neurological:     Mental Status: He is alert and oriented to person, place, and time.  Psychiatric:     Comments: Anxious     CMP Latest Ref Rng & Units 01/20/2019 04/12/2018 09/18/2017  Glucose 65 - 99 mg/dL 171(H) 141(H) 350(H)  BUN 6 - 20 mg/dL '11 11 11  '$ Creatinine 0.76 - 1.27 mg/dL 0.93 0.84 0.92  Sodium 134 - 144 mmol/L 137 141 138  Potassium 3.5 - 5.2 mmol/L 5.1 4.3 5.4(H)  Chloride 96 - 106 mmol/L 95(L) 98 97  CO2 20 - 29 mmol/L '27 24 26  '$ Calcium 8.7 - 10.2 mg/dL 10.4(H) 9.8 10.7(H)  Total Protein 6.0 - 8.5 g/dL 7.6 6.5 7.4  Total Bilirubin 0.0 - 1.2 mg/dL 0.4 0.3 0.4  Alkaline Phos 39 - 117 IU/L 154(H) 122(H) 120(H)  AST 0 - 40 IU/L '18 26 19  '$ ALT 0 - 44 IU/L '17 24 17    '$ Lipid Panel     Component Value Date/Time   CHOL 287 (H) 01/20/2019 0902   TRIG 620 (HH) 01/20/2019 0902   HDL 54 01/20/2019 0902   CHOLHDL 5.3 (H) 01/20/2019 0902   CHOLHDL 2.1 03/27/2015 0938   VLDL NOT CALC 03/27/2015 0938   LDLCALC 122 (H) 01/20/2019 0902    CBC    Component Value Date/Time   WBC 9.2 03/23/2016 0536   RBC 4.30 03/23/2016 0536   HGB 15.2 03/23/2016 0536   HCT 43.3 03/23/2016 0536    PLT 172 03/23/2016 0536   MCV 100.7 (H) 03/23/2016 0536   MCH 35.3 (H) 03/23/2016 0536   MCHC 35.1 03/23/2016 0536   RDW 12.4 03/23/2016 0536   LYMPHSABS 0.9 03/22/2016 0014   MONOABS 0.9 03/22/2016 0014   EOSABS 0.0 03/22/2016  0014   BASOSABS 0.1 03/22/2016 0014    Lab Results  Component Value Date   HGBA1C 8.6 (A) 06/28/2019    Assessment & Plan:  1. Type 1 diabetes mellitus with hyperglycemia Uncontrolled with A1c of 8.6; goal is less than 7.0 Increase Basaglar to 38 units at night Continue Novolog sliding scale Counseled on Diabetic diet, my plate method, 974 minutes of moderate intensity exercise/week Blood sugar logs with fasting goals of 80-120 mg/dl, random of less than 180 and in the event of sugars less than 60 mg/dl or greater than 400 mg/dl encouraged to notify the clinic. Advised on the need for annual eye exams, annual foot exams, Pneumonia vaccine. - POCT glucose (manual entry) - POCT glycosylated hemoglobin (Hb A1C)  2. Other male erectile dysfunction Uncontrolled He is currently on maximum dose of Viagra - sildenafil (VIAGRA) 100 MG tablet; Take 1 tablet (100 mg total) by mouth daily as needed for erectile dysfunction. At least 24 hours between doses  Dispense: 10 tablet; Refill: 1  3. Diabetic polyneuropathy associated with type 1 diabetes mellitus (HCC) Neuropathy has resolved No longer needs gabapentin which I have discontinued - atorvastatin (LIPITOR) 40 MG tablet; Take 1 tablet (40 mg total) by mouth daily.  Dispense: 30 tablet; Refill: 6 - Insulin Glargine (BASAGLAR KWIKPEN) 100 UNIT/ML SOPN; Inject 0.38 mLs (38 Units total) into the skin at bedtime.  Dispense: 30 mL; Refill: 5  4. Anxiety and depression Uncontrolled Discontinue Prozac as it was ineffective Commence Cymbalta and hydroxyzine Discussed sedating side effects of hydroxyzine - DULoxetine (CYMBALTA) 60 MG capsule; Take 1 capsule (60 mg total) by mouth daily.  Dispense: 30 capsule; Refill: 6  - hydrOXYzine (ATARAX/VISTARIL) 25 MG tablet; Take 1 tablet (25 mg total) by mouth 3 (three) times daily as needed.  Dispense: 60 tablet; Refill: 1  5. Tachycardia Tachycardia secondary to anxiety with heart rate of 110 EKG performed revealed heart rate of 92, normal sinus rhythm  6. Essential hypertension Uncontrolled Switch from lisinopril to lisinopril/HCTZ - lisinopril-hydrochlorothiazide (ZESTORETIC) 10-12.5 MG tablet; Take 1 tablet by mouth daily.  Dispense: 30 tablet; Refill: 6 - Basic Metabolic Panel   Return in about 3 months (around 09/28/2019) for Diabetes mellitus-virtual.    Charlott Rakes, MD, FAAFP. [facility] and [place], [place]   06/28/2019, 2:58 PM

## 2019-06-28 NOTE — Patient Instructions (Signed)

## 2019-06-29 LAB — BASIC METABOLIC PANEL
BUN/Creatinine Ratio: 12 (ref 9–20)
BUN: 11 mg/dL (ref 6–20)
CO2: 25 mmol/L (ref 20–29)
Calcium: 10.2 mg/dL (ref 8.7–10.2)
Chloride: 96 mmol/L (ref 96–106)
Creatinine, Ser: 0.95 mg/dL (ref 0.76–1.27)
GFR calc Af Amer: 125 mL/min/{1.73_m2} (ref >59)
GFR calc non Af Amer: 108 mL/min/{1.73_m2} (ref >59)
Glucose: 243 mg/dL — ABNORMAL HIGH (ref 65–99)
Potassium: 4.8 mmol/L (ref 3.5–5.2)
Sodium: 137 mmol/L (ref 134–144)

## 2019-07-01 ENCOUNTER — Telehealth: Payer: Self-pay

## 2019-07-01 NOTE — Telephone Encounter (Signed)
-----   Message from Enobong Newlin, MD sent at 06/29/2019 10:05 AM EST ----- Please inform him labs are stable but reveal elevated glucose.  Please advise him to continue with the new regimen of insulin we had discussed at his last visit and a diabetic diet. 

## 2019-07-01 NOTE — Telephone Encounter (Signed)
Patient was called to go over lab results and their is no voicemail set up to leave a message.

## 2019-07-04 ENCOUNTER — Telehealth: Payer: Self-pay

## 2019-07-04 NOTE — Telephone Encounter (Signed)
Patient was called and a voicemail was left informing patient to return phone call for lab results. A letter will be mailed out informing patient to return phone call for lab results.

## 2019-07-04 NOTE — Telephone Encounter (Signed)
-----   Message from Hoy Register, MD sent at 06/29/2019 10:05 AM EST ----- Please inform him labs are stable but reveal elevated glucose.  Please advise him to continue with the new regimen of insulin we had discussed at his last visit and a diabetic diet.

## 2019-09-12 ENCOUNTER — Other Ambulatory Visit: Payer: Self-pay | Admitting: Family Medicine

## 2019-09-12 DIAGNOSIS — F32A Depression, unspecified: Secondary | ICD-10-CM

## 2019-09-29 ENCOUNTER — Ambulatory Visit: Payer: Managed Care, Other (non HMO) | Admitting: Family Medicine

## 2019-10-13 ENCOUNTER — Other Ambulatory Visit: Payer: Self-pay | Admitting: Family Medicine

## 2019-10-13 DIAGNOSIS — F419 Anxiety disorder, unspecified: Secondary | ICD-10-CM

## 2019-11-19 ENCOUNTER — Other Ambulatory Visit: Payer: Self-pay | Admitting: Family Medicine

## 2019-11-19 DIAGNOSIS — F419 Anxiety disorder, unspecified: Secondary | ICD-10-CM

## 2019-11-19 DIAGNOSIS — F329 Major depressive disorder, single episode, unspecified: Secondary | ICD-10-CM

## 2019-11-19 NOTE — Telephone Encounter (Signed)
Requested Prescriptions  Pending Prescriptions Disp Refills   hydrOXYzine (ATARAX/VISTARIL) 25 MG tablet [Pharmacy Med Name: hydrOXYzine HCl 25 MG Oral Tablet] 60 tablet 0    Sig: Take 1 tablet by mouth three times daily as needed     Ear, Nose, and Throat:  Antihistamines Passed - 11/19/2019  4:19 PM      Passed - Valid encounter within last 12 months    Recent Outpatient Visits          4 months ago Type 1 diabetes mellitus with hyperglycemia (HCC)   [facility] [place], [place], MD   10 months ago Type 2 diabetes mellitus with hyperglycemia, with long-term current use of insulin (HCC)   [facility] [place], Odette Horns, MD   1 year ago DM (diabetes mellitus) type 1, uncontrolled, with ketoacidosis (HCC)   [facility] [place], Odette Horns, MD   2 years ago Type 1 diabetes mellitus with other specified complication [facility])   [facility] [place], Odette Horns, MD   3 years ago Uncontrolled type 1 diabetes mellitus with ketoacidosis without coma [facility])   [facility] Hoy Register, MD

## 2019-12-30 ENCOUNTER — Other Ambulatory Visit: Payer: Self-pay | Admitting: Family Medicine

## 2019-12-30 DIAGNOSIS — F32A Depression, unspecified: Secondary | ICD-10-CM

## 2020-02-03 ENCOUNTER — Other Ambulatory Visit: Payer: Self-pay | Admitting: Family Medicine

## 2020-02-03 DIAGNOSIS — E1165 Type 2 diabetes mellitus with hyperglycemia: Secondary | ICD-10-CM

## 2020-02-03 DIAGNOSIS — Z794 Long term (current) use of insulin: Secondary | ICD-10-CM

## 2020-02-03 NOTE — Telephone Encounter (Signed)
Requested medication (s) are due for refill today: Yes  Requested medication (s) are on the active medication list: Yes  Last refill:  01/19/19  Future visit scheduled: Yes  Notes to clinic:  Prescription has expired.    Requested Prescriptions  Pending Prescriptions Disp Refills   HUMALOG KWIKPEN 100 UNIT/ML KwikPen [Pharmacy Med Name: HumaLOG KwikPen 100 UNIT/ML Subcutaneous Solution Pen-injector] 15 mL 0    Sig: INJECT 0-0.12 MLS (0-12 UNITS TOTAL) INTO THE SKIN 3 TIMES DAILY BEFORE MEALS      Endocrinology:  Diabetes - Insulins Failed - 02/03/2020 10:44 AM      Failed - HBA1C is between 0 and 7.9 and within 180 days    HbA1c, POC (controlled diabetic range)  Date Value Ref Range Status  06/28/2019 8.6 (A) 0.0 - 7.0 % Final          Failed - Valid encounter within last 6 months    Recent Outpatient Visits           7 months ago Type 1 diabetes mellitus with hyperglycemia (HCC)   [facility] [place], [place], MD   1 year ago Type 2 diabetes mellitus with hyperglycemia, with long-term current use of insulin (HCC)   [facility] [place], Odette Horns, MD   1 year ago DM (diabetes mellitus) type 1, uncontrolled, with ketoacidosis (HCC)   [facility] [place], [place], MD   2 years ago Type 1 diabetes mellitus with other specified complication (HCC)   [facility] [place], [place], MD   3 years ago Uncontrolled type 1 diabetes mellitus with ketoacidosis without coma (HCC)   [facility] Hoy Register, MD       Future Appointments             In 1 month Hoy Register, MD [facility]

## 2020-02-10 ENCOUNTER — Other Ambulatory Visit: Payer: Self-pay | Admitting: Family Medicine

## 2020-02-10 DIAGNOSIS — F419 Anxiety disorder, unspecified: Secondary | ICD-10-CM

## 2020-02-10 DIAGNOSIS — F32A Depression, unspecified: Secondary | ICD-10-CM

## 2020-03-12 ENCOUNTER — Ambulatory Visit: Payer: Managed Care, Other (non HMO) | Attending: Family Medicine | Admitting: Family Medicine

## 2020-03-12 ENCOUNTER — Ambulatory Visit: Payer: Managed Care, Other (non HMO) | Admitting: Family Medicine

## 2020-03-15 ENCOUNTER — Other Ambulatory Visit: Payer: Self-pay | Admitting: Family Medicine

## 2020-03-15 DIAGNOSIS — F419 Anxiety disorder, unspecified: Secondary | ICD-10-CM

## 2020-03-15 DIAGNOSIS — F32A Depression, unspecified: Secondary | ICD-10-CM

## 2020-03-15 NOTE — Telephone Encounter (Signed)
Requested medications are due for refill today yes  Requested medications are on the active medication list yes  Last refill 10/16  Last visit 06/2019  Future visit scheduled no  Notes to clinic Unsure if this was to continue since rx was only written for 2 months, no upcoming visit scheduled.

## 2020-03-16 ENCOUNTER — Other Ambulatory Visit: Payer: Self-pay | Admitting: Family Medicine

## 2020-03-16 DIAGNOSIS — E1042 Type 1 diabetes mellitus with diabetic polyneuropathy: Secondary | ICD-10-CM

## 2020-03-16 NOTE — Telephone Encounter (Signed)
Requested medication (s) are due for refill today:  yes  Requested medication (s) are on the active medication list:  Yes  Future visit scheduled:  No  Last Refill: 06/28/19; 30 ml./ RF x 5  Notes to clinic:  pt. Last seen in 06/2019; has cancelled or no showed f/u appts.  Attempted to contact pt. To sched. F/u appt.  No answer on home phone; no vm set up.  Mobile phone not in service.   Requested Prescriptions  Pending Prescriptions Disp Refills   Insulin Glargine (BASAGLAR KWIKPEN) 100 UNIT/ML [Pharmacy Med Name: Basaglar KwikPen 100 UNIT/ML Subcutaneous Solution Pen-injector] 30 mL 0    Sig: INJECT 35 UNITS SUBCUTANEOUSLY AT BEDTIME **DISCONTINUE PREVIOUS DOSE **      Endocrinology:  Diabetes - Insulins Failed - 03/16/2020  2:55 PM      Failed - HBA1C is between 0 and 7.9 and within 180 days    HbA1c, POC (controlled diabetic range)  Date Value Ref Range Status  06/28/2019 8.6 (A) 0.0 - 7.0 % Final          Failed - Valid encounter within last 6 months    Recent Outpatient Visits           8 months ago Type 1 diabetes mellitus with hyperglycemia (HCC)   [facility] [place], [place], MD   1 year ago Type 2 diabetes mellitus with hyperglycemia, with long-term current use of insulin (HCC)   [facility] [place], Odette Horns, MD   1 year ago DM (diabetes mellitus) type 1, uncontrolled, with ketoacidosis (HCC)   [facility] [place], [place], MD   2 years ago Type 1 diabetes mellitus with other specified complication [facility])   [facility] [place], Odette Horns, MD   3 years ago Uncontrolled type 1 diabetes mellitus with ketoacidosis without coma (HCC)   [facility] Hoy Register, MD

## 2020-03-31 ENCOUNTER — Other Ambulatory Visit: Payer: Self-pay | Admitting: Family Medicine

## 2020-03-31 DIAGNOSIS — F32A Depression, unspecified: Secondary | ICD-10-CM

## 2020-04-03 ENCOUNTER — Other Ambulatory Visit: Payer: Self-pay | Admitting: Family Medicine

## 2020-04-03 DIAGNOSIS — F419 Anxiety disorder, unspecified: Secondary | ICD-10-CM

## 2020-04-03 DIAGNOSIS — F32A Depression, unspecified: Secondary | ICD-10-CM

## 2020-04-03 NOTE — Telephone Encounter (Signed)
Requested medication (s) are due for refill today: yes  Requested medication (s) are on the active medication list: yes  Last refill:  03/16/20 #60 0 refills  Future visit scheduled: no  Notes to clinic:  attempted to call patient on both # provider and unable to leave voicemail to call back and schedule appt. Do you want courtesy refill?     Requested Prescriptions  Pending Prescriptions Disp Refills   hydrOXYzine (ATARAX/VISTARIL) 25 MG tablet [Pharmacy Med Name: hydrOXYzine HCl 25 MG Oral Tablet] 60 tablet 0    Sig: TAKE 1 TABLET BY MOUTH THREE TIMES DAILY AS NEEDED **MUST  HAVE  OFFICE  VISIT  FOR  REFILLS**      Ear, Nose, and Throat:  Antihistamines Passed - 04/03/2020  6:10 PM      Passed - Valid encounter within last 12 months    Recent Outpatient Visits           9 months ago Type 1 diabetes mellitus with hyperglycemia (HCC)   [facility] [place], [place], MD   1 year ago Type 2 diabetes mellitus with hyperglycemia, with long-term current use of insulin (HCC)   [facility] [place], Odette Horns, MD   1 year ago DM (diabetes mellitus) type 1, uncontrolled, with ketoacidosis (HCC)   [facility] [place], [place], MD   2 years ago Type 1 diabetes mellitus with other specified complication [facility])   [facility] [place], Odette Horns, MD   3 years ago Uncontrolled type 1 diabetes mellitus with ketoacidosis without coma (HCC)   [facility] Hoy Register, MD

## 2020-04-18 ENCOUNTER — Other Ambulatory Visit: Payer: Self-pay | Admitting: Family Medicine

## 2020-04-18 DIAGNOSIS — E1042 Type 1 diabetes mellitus with diabetic polyneuropathy: Secondary | ICD-10-CM

## 2020-04-18 MED ORDER — BASAGLAR KWIKPEN 100 UNIT/ML ~~LOC~~ SOPN: 35.0000 [IU] | PEN_INJECTOR | Freq: Every day | SUBCUTANEOUS | 0 refills | Status: DC

## 2020-04-18 NOTE — Telephone Encounter (Signed)
Pt has scheduled next available appt for diabetes f/u on 1.13.22 but is in need of a refill and about to run out of his night insulin Insulin Glargine (BASAGLAR KWIKPEN) 100 UNIT/ML Pt asked if it can be a courtesy refill until his appt / please advise asap and send to [facility] [address] [place] - [address]  [address], [place] [address]  Phone:  [phone] Fax:  [phone]

## 2020-04-26 ENCOUNTER — Other Ambulatory Visit: Payer: Self-pay | Admitting: Family Medicine

## 2020-04-26 DIAGNOSIS — F419 Anxiety disorder, unspecified: Secondary | ICD-10-CM

## 2020-04-26 NOTE — Telephone Encounter (Signed)
Refill provided. Office visit scheduled on 05/10/20

## 2020-05-06 ENCOUNTER — Other Ambulatory Visit: Payer: Self-pay | Admitting: Family Medicine

## 2020-05-06 DIAGNOSIS — E1165 Type 2 diabetes mellitus with hyperglycemia: Secondary | ICD-10-CM

## 2020-05-10 ENCOUNTER — Ambulatory Visit: Payer: Managed Care, Other (non HMO) | Admitting: Physician Assistant

## 2020-05-25 ENCOUNTER — Other Ambulatory Visit: Payer: Self-pay | Admitting: Family Medicine

## 2020-05-25 DIAGNOSIS — F419 Anxiety disorder, unspecified: Secondary | ICD-10-CM

## 2020-05-25 DIAGNOSIS — F32A Depression, unspecified: Secondary | ICD-10-CM

## 2020-05-26 ENCOUNTER — Other Ambulatory Visit: Payer: Self-pay | Admitting: Family Medicine

## 2020-05-26 DIAGNOSIS — E1042 Type 1 diabetes mellitus with diabetic polyneuropathy: Secondary | ICD-10-CM

## 2020-05-26 NOTE — Telephone Encounter (Signed)
Requested medications are due for refill today.  yes  Requested medications are on the active medications list.  yes  Last refill. 04/17/2020  Future visit scheduled.   No  Notes to clinic. Called pt to schedule appt - person answering phone said he was not available.

## 2020-05-28 NOTE — Telephone Encounter (Signed)
Pt calling to f/up on his refills

## 2020-06-20 ENCOUNTER — Ambulatory Visit: Payer: Managed Care, Other (non HMO) | Admitting: Physician Assistant

## 2020-07-06 ENCOUNTER — Other Ambulatory Visit: Payer: Self-pay | Admitting: Family Medicine

## 2020-07-06 DIAGNOSIS — E1042 Type 1 diabetes mellitus with diabetic polyneuropathy: Secondary | ICD-10-CM

## 2020-07-06 NOTE — Telephone Encounter (Signed)
  Requested medications are on the active medication list The dose is not consistent with current med list  Last refill 2/1  Last visit 3/2  Future visit scheduled 3/30  Notes to clinic Last visit OV states to take 38 units, current med list from 2/1 says 35 units, please assess.

## 2020-07-22 ENCOUNTER — Other Ambulatory Visit: Payer: Self-pay | Admitting: Family Medicine

## 2020-07-22 DIAGNOSIS — E1165 Type 2 diabetes mellitus with hyperglycemia: Secondary | ICD-10-CM

## 2020-07-22 NOTE — Telephone Encounter (Signed)
Requested medication (s) are due for refill today Yes  Requested medication (s) are on the active medication list Yes  Future visit scheduled Yes on 07/25/20.   Note to clinic-Pt has missed last 3 visits scheduled. Medication profile states he must keep 07/25/20 visit in order to receive further refill. Routing to clinic for decision as today is 07/22/20.   Requested Prescriptions  Pending Prescriptions Disp Refills   HUMALOG KWIKPEN 100 UNIT/ML KwikPen [Pharmacy Med Name: HumaLOG KwikPen 100 UNIT/ML Subcutaneous Solution Pen-injector] 15 mL 0    Sig: INJECT 0 TO 12 UNITS TOTAL SUBCUTANEOUSLY THREE TIMES DAILY BEFORE MEALS ** MUST KEEP UPCOMING OFFICE VISIT FOR REFILLS**      Endocrinology:  Diabetes - Insulins Failed - 07/22/2020  6:30 AM      Failed - HBA1C is between 0 and 7.9 and within 180 days    HbA1c, POC (controlled diabetic range)  Date Value Ref Range Status  06/28/2019 8.6 (A) 0.0 - 7.0 % Final          Failed - Valid encounter within last 6 months    Recent Outpatient Visits           1 year ago Type 1 diabetes mellitus with hyperglycemia (HCC)   [facility] [place], [place], MD   1 year ago Type 2 diabetes mellitus with hyperglycemia, with long-term current use of insulin (HCC)   [facility] [place], [place], MD   2 years ago DM (diabetes mellitus) type 1, uncontrolled, with ketoacidosis (HCC)   [facility] [place], [place], MD   2 years ago Type 1 diabetes mellitus with other specified complication (HCC)   [facility] [place], [place], MD   3 years ago Uncontrolled type 1 diabetes mellitus with ketoacidosis without coma (HCC)   [facility] Hoy Register, MD       Future Appointments             In 3 days Hoy Register, MD [facility]

## 2020-07-25 ENCOUNTER — Other Ambulatory Visit: Payer: Self-pay

## 2020-07-25 ENCOUNTER — Encounter: Payer: Self-pay | Admitting: Family Medicine

## 2020-07-25 ENCOUNTER — Ambulatory Visit: Payer: Managed Care, Other (non HMO) | Attending: Family Medicine | Admitting: Family Medicine

## 2020-07-25 VITALS — BP 138/91 | HR 104 | Ht 65.0 in | Wt 195.0 lb

## 2020-07-25 DIAGNOSIS — F419 Anxiety disorder, unspecified: Secondary | ICD-10-CM

## 2020-07-25 DIAGNOSIS — F32A Depression, unspecified: Secondary | ICD-10-CM

## 2020-07-25 DIAGNOSIS — E1042 Type 1 diabetes mellitus with diabetic polyneuropathy: Secondary | ICD-10-CM | POA: Diagnosis not present

## 2020-07-25 DIAGNOSIS — I10 Essential (primary) hypertension: Secondary | ICD-10-CM

## 2020-07-25 DIAGNOSIS — E1065 Type 1 diabetes mellitus with hyperglycemia: Secondary | ICD-10-CM

## 2020-07-25 LAB — POCT GLYCOSYLATED HEMOGLOBIN (HGB A1C): HbA1c, POC (controlled diabetic range): 8.2 % — AB (ref 0.0–7.0)

## 2020-07-25 LAB — GLUCOSE, POCT (MANUAL RESULT ENTRY): POC Glucose: 231 mg/dl — AB (ref 70–99)

## 2020-07-25 MED ORDER — BASAGLAR KWIKPEN 100 UNIT/ML ~~LOC~~ SOPN: PEN_INJECTOR | SUBCUTANEOUS | 6 refills | Status: DC

## 2020-07-25 MED ORDER — LISINOPRIL-HYDROCHLOROTHIAZIDE 10-12.5 MG PO TABS: 1.0000 | ORAL_TABLET | Freq: Every day | ORAL | 6 refills | Status: DC

## 2020-07-25 MED ORDER — HYDROXYZINE HCL 25 MG PO TABS: ORAL_TABLET | ORAL | 2 refills | Status: DC

## 2020-07-25 MED ORDER — ATORVASTATIN CALCIUM 40 MG PO TABS: 40.0000 mg | ORAL_TABLET | Freq: Every day | ORAL | 6 refills | Status: DC

## 2020-07-25 NOTE — Patient Instructions (Signed)
Diabetes Mellitus and Foot Care Foot care is an important part of your health, especially when you have diabetes. Diabetes may cause you to have problems because of poor blood flow (circulation) to your feet and legs, which can cause your skin to:  Become thinner and drier.  Break more easily.  Heal more slowly.  Peel and crack. You may also have nerve damage (neuropathy) in your legs and feet, causing decreased feeling in them. This means that you may not notice minor injuries to your feet that could lead to more serious problems. Noticing and addressing any potential problems early is the best way to prevent future foot problems. How to care for your feet Foot hygiene  Wash your feet daily with warm water and mild soap. Do not use hot water. Then, pat your feet and the areas between your toes until they are completely dry. Do not soak your feet as this can dry your skin.  Trim your toenails straight across. Do not dig under them or around the cuticle. File the edges of your nails with an emery board or nail file.  Apply a moisturizing lotion or petroleum jelly to the skin on your feet and to dry, brittle toenails. Use lotion that does not contain alcohol and is unscented. Do not apply lotion between your toes.   Shoes and socks  Wear clean socks or stockings every day. Make sure they are not too tight. Do not wear knee-high stockings since they may decrease blood flow to your legs.  Wear shoes that fit properly and have enough cushioning. Always look in your shoes before you put them on to be sure there are no objects inside.  To break in new shoes, wear them for just a few hours a day. This prevents injuries on your feet. Wounds, scrapes, corns, and calluses  Check your feet daily for blisters, cuts, bruises, sores, and redness. If you cannot see the bottom of your feet, use a mirror or ask someone for help.  Do not cut corns or calluses or try to remove them with medicine.  If you  find a minor scrape, cut, or break in the skin on your feet, keep it and the skin around it clean and dry. You may clean these areas with mild soap and water. Do not clean the area with peroxide, alcohol, or iodine.  If you have a wound, scrape, corn, or callus on your foot, look at it several times a day to make sure it is healing and not infected. Check for: ? Redness, swelling, or pain. ? Fluid or blood. ? Warmth. ? Pus or a bad smell.   General tips  Do not cross your legs. This may decrease blood flow to your feet.  Do not use heating pads or hot water bottles on your feet. They may burn your skin. If you have lost feeling in your feet or legs, you may not know this is happening until it is too late.  Protect your feet from hot and cold by wearing shoes, such as at the beach or on hot pavement.  Schedule a complete foot exam at least once a year (annually) or more often if you have foot problems. Report any cuts, sores, or bruises to your health care provider immediately. Where to find more information  American Diabetes Association: www.diabetes.org  Association of Diabetes Care & Education Specialists: www.diabeteseducator.org Contact a health care provider if:  You have a medical condition that increases your risk of infection and   you have any cuts, sores, or bruises on your feet.  You have an injury that is not healing.  You have redness on your legs or feet.  You feel burning or tingling in your legs or feet.  You have pain or cramps in your legs and feet.  Your legs or feet are numb.  Your feet always feel cold.  You have pain around any toenails. Get help right away if:  You have a wound, scrape, corn, or callus on your foot and: ? You have pain, swelling, or redness that gets worse. ? You have fluid or blood coming from the wound, scrape, corn, or callus. ? Your wound, scrape, corn, or callus feels warm to the touch. ? You have pus or a bad smell coming from  the wound, scrape, corn, or callus. ? You have a fever. ? You have a red line going up your leg. Summary  Check your feet every day for blisters, cuts, bruises, sores, and redness.  Apply a moisturizing lotion or petroleum jelly to the skin on your feet and to dry, brittle toenails.  Wear shoes that fit properly and have enough cushioning.  If you have foot problems, report any cuts, sores, or bruises to your health care provider immediately.  Schedule a complete foot exam at least once a year (annually) or more often if you have foot problems. This information is not intended to replace advice given to you by your health care provider. Make sure you discuss any questions you have with your health care provider. Document Revised: 11/03/2019 Document Reviewed: 11/03/2019 Elsevier Patient Education  2021 Elsevier Inc.  

## 2020-07-25 NOTE — Progress Notes (Signed)
Subjective:  Patient ID: Christopher Robles, male    DOB: 1990-04-29  Age: 30 y.o. MRN: [id number]  CC: Diabetes   HPI Christopher Robles  is a 30 year old patient with a history of type 1 diabetes mellitus (A1c 8.2), hypertension here for follow-up visit. He endorses late night eating from 11 to 3 am as he now works night shift. He is on 38 units of Basaglar and he administers 7 to 8 units of Humalog 3 times daily with meals.  He does not exercise regularly.  He has not had hypoglycemic episodes, blurry vision or numbness in extremities His blood pressure is slightly elevated but he endorses compliance with his antihypertensive. He has no additional concerns today.  Past Medical History:  Diagnosis Date  . Anxiety   . DM I (diabetes mellitus, type I), uncontrolled ([place])   . ETOH abuse   . Seizure [facility])     Past Surgical History:  Procedure Laterality Date  . APPENDECTOMY      Family History  Problem Relation Age of Onset  . Cancer Maternal Aunt   . Cancer Maternal Uncle   . Heart disease Paternal Grandfather   . Anxiety disorder Mother   . Bipolar disorder Brother     No Known Allergies  Outpatient Medications Prior to Visit  Medication Sig Dispense Refill  . HUMALOG KWIKPEN 100 UNIT/ML KwikPen Inject 0 to 12 units total subcutaneously three times daily before meals. 15 mL 2  . Insulin Pen Needle 31G X 8 MM MISC Use 4 times daily with meals and at bedtime as directed. 120 each 5  . Multiple Vitamin (MULTIVITAMIN WITH MINERALS) TABS tablet Take 1 tablet by mouth daily. 30 tablet 0  . TRUEPLUS INSULIN SYRINGE 30G X 5/16" 0.5 ML MISC USE TO INJECT INSULIN DAILY AS DIRECTED BY PHYSICIAN 100 each 12  . atorvastatin (LIPITOR) 40 MG tablet Take 1 tablet (40 mg total) by mouth daily. 30 tablet 6  . hydrOXYzine (ATARAX/VISTARIL) 25 MG tablet TAKE 1 TABLET BY MOUTH THREE TIMES DAILY AS NEEDED **MUST  HAVE  OFFICE  VISIT  FOR  REFILLS** 60 tablet 0  . Insulin Glargine (BASAGLAR KWIKPEN) 100  UNIT/ML INJECT 35 UNITS SUBCUTANEOUSLY AT BEDTIME ** MUST HAVE OFFICE VISIT FOR REFILLS** 15 mL 0  . lisinopril-hydrochlorothiazide (ZESTORETIC) 10-12.5 MG tablet Take 1 tablet by mouth daily. 30 tablet 6  . blood glucose meter kit and supplies Dispense based on patient and insurance preference. Use up to four times daily as directed. (FOR ICD-9 250.00, 250.01). 1 each 0  . sildenafil (VIAGRA) 100 MG tablet Take 1 tablet (100 mg total) by mouth daily as needed for erectile dysfunction. At least 24 hours between doses (Patient not taking: Reported on 07/25/2020) 10 tablet 1  . DULoxetine (CYMBALTA) 60 MG capsule Take 1 capsule (60 mg total) by mouth daily. (Patient not taking: Reported on 07/25/2020) 30 capsule 6   No facility-administered medications prior to visit.     ROS Review of Systems  Constitutional: Negative for activity change and appetite change.  HENT: Negative for sinus pressure and sore throat.   Eyes: Negative for visual disturbance.  Respiratory: Negative for cough, chest tightness and shortness of breath.   Cardiovascular: Negative for chest pain and leg swelling.  Gastrointestinal: Negative for abdominal distention, abdominal pain, constipation and diarrhea.  Endocrine: Negative.   Genitourinary: Negative for dysuria.  Musculoskeletal: Negative for joint swelling and myalgias.  Skin: Negative for rash.  Allergic/Immunologic: Negative.   Neurological:  Negative for weakness, light-headedness and numbness.  Psychiatric/Behavioral: Negative for dysphoric mood and suicidal ideas.    Objective:  BP (!) 138/91 (BP Location: Left Arm, Patient Position: Sitting)   Pulse (!) 104   Ht $R'5\' 5"'wY$  (1.651 m)   Wt 195 lb (88.5 kg)   SpO2 99%   BMI 32.45 kg/m   BP/Weight 07/25/2020 06/28/2019 64/33/2951  Systolic BP 884 166 063  Diastolic BP 91 016 92  Wt. (Lbs) 195 190 193  BMI 32.45 31.62 32.12      Physical Exam Constitutional:      Appearance: He is well-developed.  Neck:      Vascular: No JVD.  Cardiovascular:     Rate and Rhythm: Normal rate.     Heart sounds: Normal heart sounds. No murmur heard.   Pulmonary:     Effort: Pulmonary effort is normal.     Breath sounds: Normal breath sounds. No wheezing or rales.  Chest:     Chest wall: No tenderness.  Abdominal:     General: Bowel sounds are normal. There is no distension.     Palpations: Abdomen is soft. There is no mass.     Tenderness: There is no abdominal tenderness.  Musculoskeletal:        General: Normal range of motion.     Right lower leg: No edema.     Left lower leg: No edema.  Neurological:     Mental Status: He is alert and oriented to person, place, and time.  Psychiatric:        Mood and Affect: Mood normal.     CMP Latest Ref Rng & Units 06/28/2019 01/20/2019 04/12/2018  Glucose 65 - 99 mg/dL 243(H) 171(H) 141(H)  BUN 6 - 20 mg/dL $Remove'11 11 11  'qdvVtlB$ Creatinine 0.76 - 1.27 mg/dL 0.95 0.93 0.84  Sodium 134 - 144 mmol/L 137 137 141  Potassium 3.5 - 5.2 mmol/L 4.8 5.1 4.3  Chloride 96 - 106 mmol/L 96 95(L) 98  CO2 20 - 29 mmol/L $RemoveB'25 27 24  'WTMsugCf$ Calcium 8.7 - 10.2 mg/dL 10.2 10.4(H) 9.8  Total Protein 6.0 - 8.5 g/dL - 7.6 6.5  Total Bilirubin 0.0 - 1.2 mg/dL - 0.4 0.3  Alkaline Phos 39 - 117 IU/L - 154(H) 122(H)  AST 0 - 40 IU/L - 18 26  ALT 0 - 44 IU/L - 17 24    Lipid Panel     Component Value Date/Time   CHOL 287 (H) 01/20/2019 0902   TRIG 620 (HH) 01/20/2019 0902   HDL 54 01/20/2019 0902   CHOLHDL 5.3 (H) 01/20/2019 0902   CHOLHDL 2.1 03/27/2015 0938   VLDL NOT CALC 03/27/2015 0938   LDLCALC 122 (H) 01/20/2019 0902    CBC    Component Value Date/Time   WBC 9.2 03/23/2016 0536   RBC 4.30 03/23/2016 0536   HGB 15.2 03/23/2016 0536   HCT 43.3 03/23/2016 0536   PLT 172 03/23/2016 0536   MCV 100.7 (H) 03/23/2016 0536   MCH 35.3 (H) 03/23/2016 0536   MCHC 35.1 03/23/2016 0536   RDW 12.4 03/23/2016 0536   LYMPHSABS 0.9 03/22/2016 0014   MONOABS 0.9 03/22/2016 0014   EOSABS  0.0 03/22/2016 0014   BASOSABS 0.1 03/22/2016 0014    Lab Results  Component Value Date   HGBA1C 8.2 (A) 07/25/2020    Assessment & Plan:  1. Type 1 diabetes mellitus with hyperglycemia (HCC) Above goal with A1c of 8.2; goal is less than 7.0 Increase Basaglar dose  Counseled on Diabetic diet, my plate method, 594 minutes of moderate intensity exercise/week Blood sugar logs with fasting goals of 80-120 mg/dl, random of less than 180 and in the event of sugars less than 60 mg/dl or greater than 400 mg/dl encouraged to notify the clinic. Advised on the need for annual eye exams, annual foot exams, Pneumonia vaccine. - POCT glucose (manual entry) - POCT glycosylated hemoglobin (Hb A1C) - CMP14+EGFR; Future - Lipid panel; Future  2. Diabetic polyneuropathy associated with type 1 diabetes mellitus (HCC) Stable - Insulin Glargine (BASAGLAR KWIKPEN) 100 UNIT/ML; INJECT 42 UNITS SUBCUTANEOUSLY AT BEDTIME  Dispense: 30 mL; Refill: 6 - atorvastatin (LIPITOR) 40 MG tablet; Take 1 tablet (40 mg total) by mouth daily.  Dispense: 30 tablet; Refill: 6  3. Essential hypertension Slightly above goal Work on weight loss and lifestyle modifications but no regimen changes today Counseled on blood pressure goal of less than 130/80, low-sodium, DASH diet, medication compliance, 150 minutes of moderate intensity exercise per week. Discussed medication compliance, adverse effects. - lisinopril-hydrochlorothiazide (ZESTORETIC) 10-12.5 MG tablet; Take 1 tablet by mouth daily.  Dispense: 30 tablet; Refill: 6  4. Anxiety and depression Controlled He no longer needs Cymbalta and I have discontinued this - hydrOXYzine (ATARAX/VISTARIL) 25 MG tablet; TAKE 1 TABLET BY MOUTH THREE TIMES DAILY AS NEEDED  Dispense: 60 tablet; Refill: 2   Meds ordered this encounter  Medications  . Insulin Glargine (BASAGLAR KWIKPEN) 100 UNIT/ML    Sig: INJECT 42 UNITS SUBCUTANEOUSLY AT BEDTIME    Dispense:  30 mL    Refill:   6  . atorvastatin (LIPITOR) 40 MG tablet    Sig: Take 1 tablet (40 mg total) by mouth daily.    Dispense:  30 tablet    Refill:  6    Dose increase  . lisinopril-hydrochlorothiazide (ZESTORETIC) 10-12.5 MG tablet    Sig: Take 1 tablet by mouth daily.    Dispense:  30 tablet    Refill:  6  . hydrOXYzine (ATARAX/VISTARIL) 25 MG tablet    Sig: TAKE 1 TABLET BY MOUTH THREE TIMES DAILY AS NEEDED    Dispense:  60 tablet    Refill:  2    Follow-up: Return in about 3 months (around 10/25/2020) for disease management.       Charlott Rakes, MD, FAAFP. [facility] [place], [place]   07/25/2020, 2:40 PM

## 2020-08-17 ENCOUNTER — Other Ambulatory Visit: Payer: Self-pay | Admitting: Family Medicine

## 2020-08-17 DIAGNOSIS — E1042 Type 1 diabetes mellitus with diabetic polyneuropathy: Secondary | ICD-10-CM

## 2020-08-20 ENCOUNTER — Telehealth: Payer: Self-pay | Admitting: Family Medicine

## 2020-08-20 NOTE — Telephone Encounter (Signed)
Patient is calling to ask why his medication for Insulin Glargine (BASAGLAR KWIKPEN) 100 UNIT/ML has been refused.  Patient had an OV in late march and the notes are saying he needs an office visit.  Please advise and call patient to see when he can get his meds.  Patient stated he is all out and really needs his insulin.  CB# [phone]

## 2020-08-21 NOTE — Telephone Encounter (Signed)
Both of his insulins were sent to [facility] last month with refills.

## 2020-08-21 NOTE — Telephone Encounter (Signed)
Called pt unable to reach or leave VM at this time.  

## 2020-08-27 NOTE — Telephone Encounter (Signed)
Called pt unable to reach or leave VM at this time. VM full

## 2020-10-30 ENCOUNTER — Ambulatory Visit: Payer: Managed Care, Other (non HMO) | Admitting: Family Medicine

## 2020-12-15 ENCOUNTER — Other Ambulatory Visit: Payer: Self-pay | Admitting: Family Medicine

## 2020-12-15 DIAGNOSIS — F419 Anxiety disorder, unspecified: Secondary | ICD-10-CM

## 2021-02-09 ENCOUNTER — Telehealth: Payer: Self-pay | Admitting: Family Medicine

## 2021-02-09 DIAGNOSIS — E1165 Type 2 diabetes mellitus with hyperglycemia: Secondary | ICD-10-CM

## 2021-02-09 DIAGNOSIS — Z794 Long term (current) use of insulin: Secondary | ICD-10-CM

## 2021-02-09 NOTE — Telephone Encounter (Signed)
Pt is due OV and labs for recheck diabetes. LM on mobile number to call office to make appt. Office number provided. Last RF 07/23/20  15 ml with 2 RF's. Requested Prescriptions  Pending Prescriptions Disp Refills   HUMALOG KWIKPEN 100 UNIT/ML KwikPen [Pharmacy Med Name: HumaLOG KwikPen 100 UNIT/ML Subcutaneous Solution Pen-injector] 15 mL 0    Sig: INJECT 0-12 UNITS SUBCUTANEOUSLY THREE TIMES DAILY BEFORE MEAL(S)     Endocrinology:  Diabetes - Insulins Failed - 02/09/2021 10:35 AM      Failed - HBA1C is between 0 and 7.9 and within 180 days    HbA1c, POC (controlled diabetic range)  Date Value Ref Range Status  07/25/2020 8.2 (A) 0.0 - 7.0 % Final          Failed - Valid encounter within last 6 months    Recent Outpatient Visits           6 months ago Type 1 diabetes mellitus with hyperglycemia (HCC)   [facility] [place], [place], MD   1 year ago Type 1 diabetes mellitus with hyperglycemia (HCC)   [facility] [place], [place], MD   2 years ago Type 2 diabetes mellitus with hyperglycemia, with long-term current use of insulin (HCC)   [facility] [place], [place], MD   2 years ago DM (diabetes mellitus) type 1, uncontrolled, with ketoacidosis (HCC)   [facility] [place], [place], MD   3 years ago Type 1 diabetes mellitus with other specified complication [facility])   [facility] Hoy Register, MD

## 2021-02-13 ENCOUNTER — Other Ambulatory Visit: Payer: Self-pay | Admitting: Family Medicine

## 2021-02-13 DIAGNOSIS — Z794 Long term (current) use of insulin: Secondary | ICD-10-CM

## 2021-02-13 DIAGNOSIS — E1165 Type 2 diabetes mellitus with hyperglycemia: Secondary | ICD-10-CM

## 2021-02-13 NOTE — Telephone Encounter (Signed)
Pt scheduled an appt 10/26, stated he only has about a week left of in[place], please advise.

## 2021-02-13 NOTE — Telephone Encounter (Signed)
Medication Refill - Medication:  HUMALOG KWIKPEN 100 UNIT/ML KwikPen   Has the patient contacted their pharmacy? No, forgot to ask  Preferred Pharmacy (with phone number or street name):  [facility] [address] [place], [place] - [address]  [address] [place] [address]  Phone:  [phone]  Fax:  [phone]  Has the patient been seen for an appointment in the last year OR does the patient have an upcoming appointment? Yes.  04/02/21  *Pt states he only has about 5-7 days left on his medication*  Agent: Please be advised that RX refills may take up to 3 business days. We ask that you follow-up with your pharmacy.

## 2021-02-14 MED ORDER — HUMALOG KWIKPEN 100 UNIT/ML ~~LOC~~ SOPN: PEN_INJECTOR | SUBCUTANEOUS | 2 refills | Status: DC

## 2021-02-14 MED ORDER — INSULIN LISPRO (1 UNIT DIAL) 100 UNIT/ML (KWIKPEN): PEN_INJECTOR | SUBCUTANEOUS | 1 refills | Status: DC

## 2021-02-14 NOTE — Addendum Note (Signed)
Addended by: Lois Huxley, Jeannett Senior L on: 02/14/2021 10:19 AM   Modules accepted: Orders

## 2021-02-14 NOTE — Telephone Encounter (Signed)
Appointment 04-02-21

## 2021-02-14 NOTE — Telephone Encounter (Signed)
Rx sent 

## 2021-02-20 ENCOUNTER — Ambulatory Visit: Payer: Managed Care, Other (non HMO) | Admitting: Physician Assistant

## 2021-02-25 ENCOUNTER — Ambulatory Visit: Payer: Managed Care, Other (non HMO) | Admitting: Physician Assistant

## 2021-04-02 ENCOUNTER — Encounter: Payer: Self-pay | Admitting: Critical Care Medicine

## 2021-04-02 ENCOUNTER — Other Ambulatory Visit: Payer: Self-pay

## 2021-04-02 ENCOUNTER — Ambulatory Visit: Payer: Managed Care, Other (non HMO) | Attending: Physician Assistant | Admitting: Critical Care Medicine

## 2021-04-02 VITALS — BP 148/100 | HR 103 | Resp 16 | Wt 202.4 lb

## 2021-04-02 DIAGNOSIS — E1042 Type 1 diabetes mellitus with diabetic polyneuropathy: Secondary | ICD-10-CM | POA: Insufficient documentation

## 2021-04-02 DIAGNOSIS — F32A Depression, unspecified: Secondary | ICD-10-CM

## 2021-04-02 DIAGNOSIS — K056 Periodontal disease, unspecified: Secondary | ICD-10-CM | POA: Insufficient documentation

## 2021-04-02 DIAGNOSIS — E1065 Type 1 diabetes mellitus with hyperglycemia: Secondary | ICD-10-CM | POA: Diagnosis not present

## 2021-04-02 DIAGNOSIS — E781 Pure hyperglyceridemia: Secondary | ICD-10-CM

## 2021-04-02 DIAGNOSIS — E1010 Type 1 diabetes mellitus with ketoacidosis without coma: Secondary | ICD-10-CM

## 2021-04-02 DIAGNOSIS — F419 Anxiety disorder, unspecified: Secondary | ICD-10-CM | POA: Diagnosis not present

## 2021-04-02 DIAGNOSIS — F1010 Alcohol abuse, uncomplicated: Secondary | ICD-10-CM

## 2021-04-02 DIAGNOSIS — I10 Essential (primary) hypertension: Secondary | ICD-10-CM

## 2021-04-02 LAB — GLUCOSE, POCT (MANUAL RESULT ENTRY): POC Glucose: 126 mg/dl — AB (ref 70–99)

## 2021-04-02 LAB — POCT GLYCOSYLATED HEMOGLOBIN (HGB A1C): HbA1c, POC (controlled diabetic range): 8 % — AB (ref 0.0–7.0)

## 2021-04-02 MED ORDER — FENOFIBRATE 145 MG PO TABS: 145.0000 mg | ORAL_TABLET | Freq: Every day | ORAL | 2 refills | Status: DC

## 2021-04-02 MED ORDER — INSULIN LISPRO (1 UNIT DIAL) 100 UNIT/ML (KWIKPEN): PEN_INJECTOR | SUBCUTANEOUS | 1 refills | Status: DC

## 2021-04-02 MED ORDER — VALSARTAN-HYDROCHLOROTHIAZIDE 320-25 MG PO TABS: 1.0000 | ORAL_TABLET | Freq: Every day | ORAL | 3 refills | Status: DC

## 2021-04-02 MED ORDER — ATORVASTATIN CALCIUM 40 MG PO TABS: 40.0000 mg | ORAL_TABLET | Freq: Every day | ORAL | 6 refills | Status: DC

## 2021-04-02 MED ORDER — INSULIN PEN NEEDLE 31G X 8 MM MISC: 5 refills | Status: DC

## 2021-04-02 MED ORDER — HYDROXYZINE HCL 25 MG PO TABS: 25.0000 mg | ORAL_TABLET | Freq: Three times a day (TID) | ORAL | 2 refills | Status: DC | PRN

## 2021-04-02 MED ORDER — BASAGLAR KWIKPEN 100 UNIT/ML ~~LOC~~ SOPN: PEN_INJECTOR | SUBCUTANEOUS | 6 refills | Status: DC

## 2021-04-02 NOTE — Assessment & Plan Note (Signed)
Recent A1c still elevated at 8.0 but he is getting closer to goal  I refilled his insulin products and we will get him back in with clinical pharmacy and Dr. Alvis Lemmings in short-term follow-up  If he gets good dental care this may also help him achieve his goals

## 2021-04-02 NOTE — Assessment & Plan Note (Signed)
This has resolved.

## 2021-04-02 NOTE — Assessment & Plan Note (Signed)
Has severe lipemia as demonstrated by the lab tech when she drew his red top blood tube  We will add fenofibrate 145 mg daily to his atorvastatin dosing

## 2021-04-02 NOTE — Assessment & Plan Note (Signed)
Severe periodontal disease secondary diabetes patient given a referral to [facility] he says he will follow through on this

## 2021-04-02 NOTE — Assessment & Plan Note (Signed)
Refilled hydroxyzine for this patient he may benefit from a visit with licensed clinical social worker

## 2021-04-02 NOTE — Patient Instructions (Signed)
Refills on all medications sent to your pharmacy  Stop lisinopril HCT and start valsartan HCT one daily for blood pressure  Follow healthy diet as we discussed  See a dentist for your oral health,  I recommend [facility]  on [address]  [phone]   Full set of labs today  You declined a flu vaccine  See Dr Alvis Lemmings in 2 months and Franky Macho our clinical pharmacist to follow up your diabetes and blood pressure,  bring your glucose meter to your appoint ment with luke

## 2021-04-02 NOTE — Assessment & Plan Note (Signed)
Claims to not be drinking currently

## 2021-04-02 NOTE — Assessment & Plan Note (Signed)
Plan to begin valsartan HCT 320/25 and discontinue losartan HCT

## 2021-04-02 NOTE — Progress Notes (Signed)
Established Patient Office Visit  Subjective:  Patient ID: Christopher Robles, male    DOB: November 16, 1990  Age: 30 y.o. MRN: [id number]  CC:  Chief Complaint  Patient presents with   Diabetes   Medication Refill    HPI Christopher Robles presents for primary care follow-up.  This is a primary care patient of Dr. Margarita Rana.  Patient has longstanding history of type 2 diabetes last seen in March of this year.  He failed to follow-up with clinical pharmacy when last seen in March of this year.  Patient works at [employer] and though he does not eat the doughnuts he is exposed to sugar in the environment.  Patient's blood sugars have been running somewhat higher recently.  Today blood sugar is 126.  He takes anywhere from 7 to 8 units of short acting insulin 3 times daily and then has the long-acting insulin insulin glargine at 42 units daily.  His last hemoglobin A1c was 8.0  Patient also is elevated on blood pressure on arrival blood pressure is 148/100 and rechecked it remains elevated.  Patient is not always compliant with a low-salt diet.  He also has very poor dentition and has a tooth in the left upper molar area that has got severe caries and swelling that is been somewhat painful.  He is yet to achieve a dental appointment and he does have dental insurance.  The patient declined to receive the pneumonia vaccine or flu vaccine at this visit  Patient with history of alcohol induced pancreatitis which likely led to the patient's severe nature of his diabetes now becoming more type I and then type II  Past Medical History:  Diagnosis Date   Alcohol-induced acute pancreatitis with uninfected necrosis    Anxiety    DM I (diabetes mellitus, type I), uncontrolled    ETOH abuse    Seizure ([place])     Past Surgical History:  Procedure Laterality Date   APPENDECTOMY      Family History  Problem Relation Age of Onset   Cancer Maternal Aunt    Cancer Maternal Uncle    Heart disease Paternal  Grandfather    Anxiety disorder Mother    Bipolar disorder Brother     Social History   Socioeconomic History   Marital status: Single    Spouse name: Not on file   Number of children: Not on file   Years of education: Not on file   Highest education level: Not on file  Occupational History   Not on file  Tobacco Use   Smoking status: Never   Smokeless tobacco: Never  Substance and Sexual Activity   Alcohol use: Yes    Alcohol/week: 40.0 standard drinks    Types: 40 Cans of beer per week    Comment: last drink 2 months ago   Drug use: Yes    Types: Marijuana    Comment: last time this morning   Sexual activity: Not on file  Other Topics Concern   Not on file  Social History Narrative   Not on file   Social Determinants of Health   Financial Resource Strain: Not on file  Food Insecurity: Not on file  Transportation Needs: Not on file  Physical Activity: Not on file  Stress: Not on file  Social Connections: Not on file  Intimate Partner Violence: Not on file    Outpatient Medications Prior to Visit  Medication Sig Dispense Refill   blood glucose meter kit and supplies Dispense  based on patient and insurance preference. Use up to four times daily as directed. (FOR ICD-9 250.00, 250.01). 1 each 0   Multiple Vitamin (MULTIVITAMIN WITH MINERALS) TABS tablet Take 1 tablet by mouth daily. 30 tablet 0   TRUEPLUS INSULIN SYRINGE 30G X 5/16" 0.5 ML MISC USE TO INJECT INSULIN DAILY AS DIRECTED BY PHYSICIAN 100 each 12   atorvastatin (LIPITOR) 40 MG tablet Take 1 tablet (40 mg total) by mouth daily. 30 tablet 6   hydrOXYzine (ATARAX/VISTARIL) 25 MG tablet Take 1 tablet by mouth three times daily as needed 60 tablet 2   Insulin Glargine (BASAGLAR KWIKPEN) 100 UNIT/ML INJECT 42 UNITS SUBCUTANEOUSLY AT BEDTIME 30 mL 6   insulin lispro (HUMALOG KWIKPEN) 100 UNIT/ML KwikPen Inject 0 to 12 units total subcutaneously three times daily before meals. 15 mL 1   Insulin Pen Needle 31G X 8  MM MISC Use 4 times daily with meals and at bedtime as directed. 120 each 5   lisinopril-hydrochlorothiazide (ZESTORETIC) 10-12.5 MG tablet Take 1 tablet by mouth daily. 30 tablet 6   sildenafil (VIAGRA) 100 MG tablet Take 1 tablet (100 mg total) by mouth daily as needed for erectile dysfunction. At least 24 hours between doses 10 tablet 1   No facility-administered medications prior to visit.    No Known Allergies  ROS Review of Systems  Constitutional: Negative.   HENT:  Positive for dental problem. Negative for ear pain, postnasal drip, rhinorrhea, sinus pressure, sore throat, trouble swallowing and voice change.   Eyes: Negative.   Respiratory: Negative.  Negative for apnea, cough, choking, chest tightness, shortness of breath, wheezing and stridor.   Cardiovascular: Negative.  Negative for chest pain, palpitations and leg swelling.  Gastrointestinal: Negative.  Negative for abdominal distention, abdominal pain, nausea and vomiting.  Endocrine: Positive for polyuria.  Genitourinary: Negative.   Musculoskeletal: Negative.  Negative for arthralgias and myalgias.  Skin: Negative.  Negative for rash.  Allergic/Immunologic: Negative.  Negative for environmental allergies and food allergies.  Neurological: Negative.  Negative for dizziness, syncope, weakness and headaches.  Hematological: Negative.  Negative for adenopathy. Does not bruise/bleed easily.  Psychiatric/Behavioral: Negative.  Negative for agitation, dysphoric mood, sleep disturbance and suicidal ideas. The patient is not nervous/anxious.      Objective:    Physical Exam Vitals reviewed.  Constitutional:      Appearance: Normal appearance. He is well-developed. He is obese. He is not diaphoretic.  HENT:     Head: Normocephalic and atraumatic.     Nose: Nose normal. No nasal deformity, septal deviation, mucosal edema or rhinorrhea.     Right Sinus: No maxillary sinus tenderness or frontal sinus tenderness.     Left  Sinus: No maxillary sinus tenderness or frontal sinus tenderness.     Mouth/Throat:     Mouth: Mucous membranes are moist.     Pharynx: Oropharynx is clear. No oropharyngeal exudate.     Comments: Severely carious second molar right upper jaw with severe periodontal disease diffusely Eyes:     General: No scleral icterus.    Conjunctiva/sclera: Conjunctivae normal.     Pupils: Pupils are equal, round, and reactive to light.  Neck:     Thyroid: No thyromegaly.     Vascular: No carotid bruit or JVD.     Trachea: Trachea normal. No tracheal tenderness or tracheal deviation.  Cardiovascular:     Rate and Rhythm: Normal rate and regular rhythm.     Chest Wall: PMI is not displaced.  Pulses: Normal pulses. No decreased pulses.     Heart sounds: Normal heart sounds, S1 normal and S2 normal. Heart sounds not distant. No murmur heard. No systolic murmur is present.  No diastolic murmur is present.    No friction rub. No gallop. No S3 or S4 sounds.  Pulmonary:     Effort: No tachypnea, accessory muscle usage or respiratory distress.     Breath sounds: No stridor. No decreased breath sounds, wheezing, rhonchi or rales.  Chest:     Chest wall: No tenderness.  Abdominal:     General: Bowel sounds are normal. There is no distension.     Palpations: Abdomen is soft. Abdomen is not rigid.     Tenderness: There is no abdominal tenderness. There is no guarding or rebound.  Musculoskeletal:        General: Normal range of motion.     Cervical back: Normal range of motion and neck supple. No edema, erythema or rigidity. No muscular tenderness. Normal range of motion.  Lymphadenopathy:     Head:     Right side of head: No submental or submandibular adenopathy.     Left side of head: No submental or submandibular adenopathy.     Cervical: No cervical adenopathy.  Skin:    General: Skin is warm and dry.     Coloration: Skin is not pale.     Findings: No rash.     Nails: There is no clubbing.   Neurological:     General: No focal deficit present.     Mental Status: He is alert and oriented to person, place, and time. Mental status is at baseline.     Sensory: No sensory deficit.  Psychiatric:        Mood and Affect: Mood normal.        Speech: Speech normal.        Behavior: Behavior normal.        Thought Content: Thought content normal.        Judgment: Judgment normal.    BP (!) 148/100   Pulse (!) 103   Resp 16   Wt 202 lb 6.4 oz (91.8 kg)   SpO2 99%   BMI 33.68 kg/m  Wt Readings from Last 3 Encounters:  04/02/21 202 lb 6.4 oz (91.8 kg)  07/25/20 195 lb (88.5 kg)  06/28/19 190 lb (86.2 kg)     Health Maintenance Due  Topic Date Due   COVID-19 Vaccine (1) Never done   Pneumococcal Vaccine 57-19 Years old (1 - PCV) Never done   OPHTHALMOLOGY EXAM  Never done   TETANUS/TDAP  Never done   INFLUENZA VACCINE  Never done    There are no preventive care reminders to display for this patient.  Lab Results  Component Value Date   TSH 2.050 04/12/2018   Lab Results  Component Value Date   WBC 9.2 03/23/2016   HGB 15.2 03/23/2016   HCT 43.3 03/23/2016   MCV 100.7 (H) 03/23/2016   PLT 172 03/23/2016   Lab Results  Component Value Date   NA 137 06/28/2019   K 4.8 06/28/2019   CO2 25 06/28/2019   GLUCOSE 243 (H) 06/28/2019   BUN 11 06/28/2019   CREATININE 0.95 06/28/2019   BILITOT 0.4 01/20/2019   ALKPHOS 154 (H) 01/20/2019   AST 18 01/20/2019   ALT 17 01/20/2019   PROT 7.6 01/20/2019   ALBUMIN 4.7 01/20/2019   CALCIUM 10.2 06/28/2019   ANIONGAP 15 03/23/2016  Lab Results  Component Value Date   CHOL 287 (H) 01/20/2019   Lab Results  Component Value Date   HDL 54 01/20/2019   Lab Results  Component Value Date   LDLCALC 122 (H) 01/20/2019   Lab Results  Component Value Date   TRIG 620 (HH) 01/20/2019   Lab Results  Component Value Date   CHOLHDL 5.3 (H) 01/20/2019   Lab Results  Component Value Date   HGBA1C 8.0 (A) 04/02/2021       Assessment & Plan:   Problem List Items Addressed This Visit       Cardiovascular and Mediastinum   Essential hypertension    Plan to begin valsartan HCT 320/25 and discontinue losartan HCT      Relevant Medications   atorvastatin (LIPITOR) 40 MG tablet   valsartan-hydrochlorothiazide (DIOVAN-HCT) 320-25 MG tablet   fenofibrate (TRICOR) 145 MG tablet   Other Relevant Orders   CBC with Differential/Platelet     Digestive   Periodontal disease    Severe periodontal disease secondary diabetes patient given a referral to [facility] he says he will follow through on this        Endocrine   Uncontrolled type 1 diabetes mellitus with hyperglycemia ([place])    Recent A1c still elevated at 8.0 but he is getting closer to goal  I refilled his insulin products and we will get him back in with clinical pharmacy and Dr. Margarita Rana in short-term follow-up  If he gets good dental care this may also help him achieve his goals      Relevant Medications   atorvastatin (LIPITOR) 40 MG tablet   Insulin Glargine (BASAGLAR KWIKPEN) 100 UNIT/ML   insulin lispro (HUMALOG KWIKPEN) 100 UNIT/ML KwikPen   valsartan-hydrochlorothiazide (DIOVAN-HCT) 320-25 MG tablet   Diabetic polyneuropathy associated with type 1 diabetes mellitus ([place]) - Primary    Not currently complaining of neuropathic symptoms      Relevant Medications   atorvastatin (LIPITOR) 40 MG tablet   hydrOXYzine (ATARAX) 25 MG tablet   Insulin Glargine (BASAGLAR KWIKPEN) 100 UNIT/ML   insulin lispro (HUMALOG KWIKPEN) 100 UNIT/ML KwikPen   valsartan-hydrochlorothiazide (DIOVAN-HCT) 320-25 MG tablet     Other   ETOH abuse    Claims to not be drinking currently      Hypertriglyceridemia    Has severe lipemia as demonstrated by the lab tech when she drew his red top blood tube  We will add fenofibrate 145 mg daily to his atorvastatin dosing      Relevant Medications   atorvastatin (LIPITOR) 40 MG tablet    valsartan-hydrochlorothiazide (DIOVAN-HCT) 320-25 MG tablet   fenofibrate (TRICOR) 145 MG tablet   Anxiety and depression    Refilled hydroxyzine for this patient he may benefit from a visit with licensed clinical social worker      Relevant Medications   hydrOXYzine (ATARAX) 25 MG tablet    Meds ordered this encounter  Medications   atorvastatin (LIPITOR) 40 MG tablet    Sig: Take 1 tablet (40 mg total) by mouth daily.    Dispense:  30 tablet    Refill:  6    Dose increase   hydrOXYzine (ATARAX) 25 MG tablet    Sig: Take 1 tablet (25 mg total) by mouth 3 (three) times daily as needed.    Dispense:  60 tablet    Refill:  2   Insulin Glargine (BASAGLAR KWIKPEN) 100 UNIT/ML    Sig: INJECT 42 UNITS SUBCUTANEOUSLY AT BEDTIME  Dispense:  30 mL    Refill:  6   insulin lispro (HUMALOG KWIKPEN) 100 UNIT/ML KwikPen    Sig: Inject 0 to 12 units total subcutaneously three times daily before meals.    Dispense:  15 mL    Refill:  1   Insulin Pen Needle 31G X 8 MM MISC    Sig: Use 4 times daily with meals and at bedtime as directed.    Dispense:  120 each    Refill:  5   valsartan-hydrochlorothiazide (DIOVAN-HCT) 320-25 MG tablet    Sig: Take 1 tablet by mouth daily.    Dispense:  90 tablet    Refill:  3   fenofibrate (TRICOR) 145 MG tablet    Sig: Take 1 tablet (145 mg total) by mouth daily.    Dispense:  90 tablet    Refill:  2   I spent 38 minutes reviewing the chart complex decision making was high assessing his prior records formulating plan educating patient Follow-up: Return in about 2 months (around 06/03/2021).    Asencion Noble, MD

## 2021-04-02 NOTE — Assessment & Plan Note (Signed)
Not currently complaining of neuropathic symptoms

## 2021-04-03 ENCOUNTER — Telehealth: Payer: Self-pay

## 2021-04-03 LAB — CBC WITH DIFFERENTIAL/PLATELET
Basophils Absolute: 0.1 10*3/uL (ref 0.0–0.2)
Basos: 1 %
EOS (ABSOLUTE): 0.3 10*3/uL (ref 0.0–0.4)
Eos: 3 %
Hematocrit: 42 % (ref 37.5–51.0)
Hemoglobin: 14.8 g/dL (ref 13.0–17.7)
Immature Grans (Abs): 0.1 10*3/uL (ref 0.0–0.1)
Immature Granulocytes: 1 %
Lymphocytes Absolute: 2.6 10*3/uL (ref 0.7–3.1)
Lymphs: 32 %
MCH: 35 pg — ABNORMAL HIGH (ref 26.6–33.0)
MCHC: 35.2 g/dL (ref 31.5–35.7)
MCV: 99 fL — ABNORMAL HIGH (ref 79–97)
Monocytes Absolute: 0.6 10*3/uL (ref 0.1–0.9)
Monocytes: 8 %
Neutrophils Absolute: 4.4 10*3/uL (ref 1.4–7.0)
Neutrophils: 55 %
Platelets: 232 10*3/uL (ref 150–450)
RBC: 4.23 x10E6/uL (ref 4.14–5.80)
RDW: 12.4 % (ref 11.6–15.4)
WBC: 8.1 10*3/uL (ref 3.4–10.8)

## 2021-04-03 LAB — COMPREHENSIVE METABOLIC PANEL
ALT: 41 IU/L (ref 0–44)
AST: 68 IU/L — ABNORMAL HIGH (ref 0–40)
Albumin/Globulin Ratio: 1.7 (ref 1.2–2.2)
Albumin: 4.3 g/dL (ref 4.0–5.0)
Alkaline Phosphatase: 124 IU/L — ABNORMAL HIGH (ref 44–121)
BUN/Creatinine Ratio: 28 — ABNORMAL HIGH (ref 9–20)
BUN: 13 mg/dL (ref 6–20)
Bilirubin Total: 0.3 mg/dL (ref 0.0–1.2)
CO2: 23 mmol/L (ref 20–29)
Calcium: 9.9 mg/dL (ref 8.7–10.2)
Chloride: 92 mmol/L — ABNORMAL LOW (ref 96–106)
Creatinine, Ser: 0.47 mg/dL — ABNORMAL LOW (ref 0.76–1.27)
Globulin, Total: 2.6 g/dL (ref 1.5–4.5)
Glucose: 127 mg/dL — ABNORMAL HIGH (ref 70–99)
Potassium: 4.3 mmol/L (ref 3.5–5.2)
Sodium: 133 mmol/L — ABNORMAL LOW (ref 134–144)
Total Protein: 6.9 g/dL (ref 6.0–8.5)
eGFR: 142 mL/min/{1.73_m2} (ref >59)

## 2021-04-03 LAB — LIPID PANEL
Chol/HDL Ratio: 10.3 ratio — ABNORMAL HIGH (ref 0.0–5.0)
Cholesterol, Total: 331 mg/dL — ABNORMAL HIGH (ref 100–199)
HDL: 32 mg/dL — ABNORMAL LOW (ref >39)
Triglycerides: 970 mg/dL (ref 0–149)

## 2021-04-03 NOTE — Telephone Encounter (Signed)
Pt was called and no vm was left due to mailbox being full. Information has been sent to nurse pool and letter has been sent.

## 2021-04-03 NOTE — Telephone Encounter (Signed)
-----   Message from Storm Frisk, MD sent at 04/03/2021 12:10 PM EST ----- Let pt know kidney normal, liver function mildly elevated I expect from fat in liver, ask if he is still consuming alcohol, he should cut out alcohol, Triglycerides very high, take fenofibrate along with low fat diet and atorvastatin, blood counts normal,

## 2021-08-28 ENCOUNTER — Ambulatory Visit: Payer: Managed Care, Other (non HMO) | Attending: Physician Assistant | Admitting: Physician Assistant

## 2021-08-28 ENCOUNTER — Encounter: Payer: Self-pay | Admitting: Physician Assistant

## 2021-08-28 VITALS — BP 130/82 | HR 102 | Wt 193.2 lb

## 2021-08-28 DIAGNOSIS — E1042 Type 1 diabetes mellitus with diabetic polyneuropathy: Secondary | ICD-10-CM | POA: Diagnosis not present

## 2021-08-28 DIAGNOSIS — I10 Essential (primary) hypertension: Secondary | ICD-10-CM | POA: Diagnosis not present

## 2021-08-28 DIAGNOSIS — E781 Pure hyperglyceridemia: Secondary | ICD-10-CM

## 2021-08-28 DIAGNOSIS — E1010 Type 1 diabetes mellitus with ketoacidosis without coma: Secondary | ICD-10-CM

## 2021-08-28 LAB — POCT GLYCOSYLATED HEMOGLOBIN (HGB A1C): HbA1c, POC (controlled diabetic range): 8.2 % — AB (ref 0.0–7.0)

## 2021-08-28 LAB — GLUCOSE, POCT (MANUAL RESULT ENTRY): POC Glucose: 134 mg/dl — AB (ref 70–99)

## 2021-08-28 MED ORDER — VALSARTAN-HYDROCHLOROTHIAZIDE 320-25 MG PO TABS: 1.0000 | ORAL_TABLET | Freq: Every day | ORAL | 3 refills | Status: DC

## 2021-08-28 MED ORDER — FENOFIBRATE 145 MG PO TABS: 145.0000 mg | ORAL_TABLET | Freq: Every day | ORAL | 2 refills | Status: DC

## 2021-08-28 MED ORDER — INSULIN PEN NEEDLE 31G X 8 MM MISC: 5 refills | Status: DC

## 2021-08-28 MED ORDER — BASAGLAR KWIKPEN 100 UNIT/ML ~~LOC~~ SOPN: PEN_INJECTOR | SUBCUTANEOUS | 6 refills | Status: DC

## 2021-08-28 MED ORDER — ATORVASTATIN CALCIUM 40 MG PO TABS: 40.0000 mg | ORAL_TABLET | Freq: Every day | ORAL | 6 refills | Status: DC

## 2021-08-28 MED ORDER — INSULIN LISPRO (1 UNIT DIAL) 100 UNIT/ML (KWIKPEN): PEN_INJECTOR | SUBCUTANEOUS | 1 refills | Status: DC

## 2021-08-28 NOTE — Patient Instructions (Signed)
Check with your insurance on which insulins they prefer.  Drink 80-100 ounces water daily.   ? ?Check blood sugars fasting and bedtime and record and bring to next visit ?

## 2021-08-28 NOTE — Progress Notes (Signed)
Patient ID: Christopher Robles, male   DOB: 1991-02-14, 31 y.o.   MRN: [id number] ? ? ?Christopher Robles, is a 31 y.o. male ? ?[id number] ? ?[id number] ? ?DOB - 05-23-1990 ? ?Chief Complaint  ?Patient presents with  ? Medication Refill  ?    ? ?Subjective:  ? ?Christopher Robles is a 31 y.o. male here today for med RF.  Says insurance went up on basaglar.  I tried semglee and lantus which also show as not being covered. No new issues or concerns other than that.  Checks blood sugars but not regularly.  No HA/CP/SOB.   ? ?No problems updated. ? ?ALLERGIES: ?No Known Allergies ? ?PAST MEDICAL HISTORY: ?Past Medical History:  ?Diagnosis Date  ? Alcohol-induced acute pancreatitis with uninfected necrosis   ? Anxiety   ? DM I (diabetes mellitus, type I), uncontrolled   ? ETOH abuse   ? Seizure ([place])   ? ? ?MEDICATIONS AT HOME: ?Prior to Admission medications   ?Medication Sig Start Date End Date Taking? Authorizing Provider  ?blood glucose meter kit and supplies Dispense based on patient and insurance preference. Use up to four times daily as directed. (FOR ICD-9 250.00, 250.01). 11/24/14  Yes Robles, Christopher Delaine, MD  ?hydrOXYzine (ATARAX) 25 MG tablet Take 1 tablet (25 mg total) by mouth 3 (three) times daily as needed. 04/02/21  Yes Christopher Stain, MD  ?Multiple Vitamin (MULTIVITAMIN WITH MINERALS) TABS tablet Take 1 tablet by mouth daily. 03/25/16  Yes Christopher Fire, MD  ?Karen Chafe INSULIN SYRINGE 30G X 5/16" 0.5 ML MISC USE TO INJECT INSULIN DAILY AS DIRECTED BY PHYSICIAN 04/03/15  Yes Christopher Rakes, MD  ?atorvastatin (LIPITOR) 40 MG tablet Take 1 tablet (40 mg total) by mouth daily. 08/28/21   Christopher Donovan, PA-C  ?fenofibrate (TRICOR) 145 MG tablet Take 1 tablet (145 mg total) by mouth daily. 08/28/21   Christopher Donovan, PA-C  ?Insulin Glargine (BASAGLAR KWIKPEN) 100 UNIT/ML INJECT 46 UNITS SUBCUTANEOUSLY AT BEDTIME 08/28/21   Christopher Donovan, PA-C  ?insulin lispro (HUMALOG KWIKPEN) 100 UNIT/ML KwikPen Inject 0 to 12 units  total subcutaneously three times daily before meals. 08/28/21   Christopher Donovan, PA-C  ?Insulin Pen Needle 31G X 8 MM MISC Use 4 times daily with meals and at bedtime as directed. 08/28/21   Christopher Donovan, PA-C  ?valsartan-hydrochlorothiazide (DIOVAN-HCT) 320-25 MG tablet Take 1 tablet by mouth daily. 08/28/21   Christopher Donovan, PA-C  ? ? ?ROS: ?Neg HEENT ?Neg resp ?Neg cardiac ?Neg GI ?Neg GU ?Neg MS ?Neg psych ?Neg neuro ? ?Objective:  ? ?Vitals:  ? 08/28/21 1451 08/28/21 1511  ?BP: (!) 146/100 130/82  ?Pulse: (!) 102   ?SpO2: 96%   ?Weight: 193 lb 3.2 oz (87.6 kg)   ? ?Exam ?General appearance : Awake, alert, not in any distress. Speech Clear. Not toxic looking ?HEENT: Atraumatic and Normocephalic ?Neck: Supple, no JVD. No cervical lymphadenopathy.  ?Chest: Good air entry bilaterally, CTAB.  No rales/rhonchi/wheezing ?CVS: S1 S2 regular, no murmurs.  ?Extremities: B/L Lower Ext shows no edema, both legs are warm to touch ?Neurology: Awake alert, and oriented X 3, CN II-XII intact, Non focal ?Skin: No Rash ? ?Data Review ?Lab Results  ?Component Value Date  ? HGBA1C 8.2 (A) 08/28/2021  ? HGBA1C 8.0 (A) 04/02/2021  ? HGBA1C 8.2 (A) 07/25/2020  ? ? ?Assessment & Plan  ? ?1. DM (diabetes mellitus) type 1, uncontrolled, with ketoacidosis ([place]) ?Not controlled-increase basaglar from 42 to 46.  I instructed him to call his insurance to check on preferred meds and possibly change regimen at next visit.  Check blood sugars fasting and and at bedtime and bring to visit with Lurena Joiner in a few weeks.   ?- Glucose (CBG) ?- HgB A1c ?- Insulin Glargine (BASAGLAR KWIKPEN) 100 UNIT/ML; INJECT 46 UNITS SUBCUTANEOUSLY AT BEDTIME  Dispense: 30 mL; Refill: 6 ?- Insulin Pen Needle 31G X 8 MM MISC; Use 4 times daily with meals and at bedtime as directed.  Dispense: 120 each; Refill: 5 ?- insulin lispro (HUMALOG KWIKPEN) 100 UNIT/ML KwikPen; Inject 0 to 12 units total subcutaneously three times daily before meals.  Dispense: 15 mL;  Refill: 1 ?- Comprehensive metabolic panel ? ?2. Diabetic polyneuropathy associated with type 1 diabetes mellitus ([place]) ?- Insulin Glargine (BASAGLAR KWIKPEN) 100 UNIT/ML; INJECT 46 UNITS SUBCUTANEOUSLY AT BEDTIME  Dispense: 30 mL; Refill: 6 ?- atorvastatin (LIPITOR) 40 MG tablet; Take 1 tablet (40 mg total) by mouth daily.  Dispense: 30 tablet; Refill: 6 ? ?3. Essential hypertension ?At goal ?- valsartan-hydrochlorothiazide (DIOVAN-HCT) 320-25 MG tablet; Take 1 tablet by mouth daily.  Dispense: 90 tablet; Refill: 3 ?- Comprehensive metabolic panel ? ?4. Hypertriglyceridemia ?- fenofibrate (TRICOR) 145 MG tablet; Take 1 tablet (145 mg total) by mouth daily.  Dispense: 90 tablet; Refill: 2 ?- atorvastatin (LIPITOR) 40 MG tablet; Take 1 tablet (40 mg total) by mouth daily.  Dispense: 30 tablet; Refill: 6 ?- Lipid panel ? ? ? ?Return for 3-4 weeks with Lurena Joiner for DM and PCP in 3 months. ? ?The patient was given clear instructions to go to ER or return to medical center if symptoms don't improve, worsen or new problems develop. The patient verbalized understanding. The patient was told to call to get lab results if they haven't heard anything in the next week.  ? ? ? ? ?Freeman Caldron, PA-C ?[place] ?[place], [place] ?[phone]   ?08/28/2021, 3:19 PM  ?

## 2021-08-29 LAB — COMPREHENSIVE METABOLIC PANEL
ALT: 40 IU/L (ref 0–44)
AST: 39 IU/L (ref 0–40)
Albumin/Globulin Ratio: 1.8 (ref 1.2–2.2)
Albumin: 4.6 g/dL (ref 4.0–5.0)
Alkaline Phosphatase: 111 IU/L (ref 44–121)
BUN/Creatinine Ratio: 11 (ref 9–20)
BUN: 9 mg/dL (ref 6–20)
Bilirubin Total: 0.5 mg/dL (ref 0.0–1.2)
CO2: 23 mmol/L (ref 20–29)
Calcium: 9.8 mg/dL (ref 8.7–10.2)
Chloride: 93 mmol/L — ABNORMAL LOW (ref 96–106)
Creatinine, Ser: 0.85 mg/dL (ref 0.76–1.27)
Globulin, Total: 2.5 g/dL (ref 1.5–4.5)
Glucose: 148 mg/dL — ABNORMAL HIGH (ref 70–99)
Potassium: 4.3 mmol/L (ref 3.5–5.2)
Sodium: 134 mmol/L (ref 134–144)
Total Protein: 7.1 g/dL (ref 6.0–8.5)
eGFR: 119 mL/min/{1.73_m2} (ref >59)

## 2021-08-29 LAB — LIPID PANEL
Chol/HDL Ratio: 6.6 ratio — ABNORMAL HIGH (ref 0.0–5.0)
Cholesterol, Total: 304 mg/dL — ABNORMAL HIGH (ref 100–199)
HDL: 46 mg/dL (ref >39)
Triglycerides: 801 mg/dL (ref 0–149)

## 2021-09-17 ENCOUNTER — Encounter: Payer: Self-pay | Admitting: *Deleted

## 2022-01-06 ENCOUNTER — Other Ambulatory Visit: Payer: Self-pay | Admitting: Critical Care Medicine

## 2022-01-06 DIAGNOSIS — F32A Depression, unspecified: Secondary | ICD-10-CM

## 2022-01-06 DIAGNOSIS — F419 Anxiety disorder, unspecified: Secondary | ICD-10-CM

## 2022-04-03 ENCOUNTER — Other Ambulatory Visit: Payer: Self-pay | Admitting: Family Medicine

## 2022-04-03 DIAGNOSIS — F419 Anxiety disorder, unspecified: Secondary | ICD-10-CM

## 2022-05-10 ENCOUNTER — Other Ambulatory Visit: Payer: Self-pay | Admitting: Critical Care Medicine

## 2022-05-10 DIAGNOSIS — E1010 Type 1 diabetes mellitus with ketoacidosis without coma: Secondary | ICD-10-CM

## 2022-05-10 DIAGNOSIS — E1042 Type 1 diabetes mellitus with diabetic polyneuropathy: Secondary | ICD-10-CM

## 2022-07-16 ENCOUNTER — Other Ambulatory Visit: Payer: Self-pay | Admitting: Family Medicine

## 2022-07-16 DIAGNOSIS — E1042 Type 1 diabetes mellitus with diabetic polyneuropathy: Secondary | ICD-10-CM

## 2022-07-16 DIAGNOSIS — E1010 Type 1 diabetes mellitus with ketoacidosis without coma: Secondary | ICD-10-CM

## 2022-07-18 NOTE — Telephone Encounter (Signed)
Apt scheduled 08/06/22. Pt has one pen left. Please advise

## 2022-07-21 ENCOUNTER — Other Ambulatory Visit: Payer: Self-pay | Admitting: Family Medicine

## 2022-07-21 DIAGNOSIS — E1042 Type 1 diabetes mellitus with diabetic polyneuropathy: Secondary | ICD-10-CM

## 2022-07-21 DIAGNOSIS — E1010 Type 1 diabetes mellitus with ketoacidosis without coma: Secondary | ICD-10-CM

## 2022-08-06 ENCOUNTER — Ambulatory Visit: Payer: Managed Care, Other (non HMO) | Attending: Physician Assistant | Admitting: Physician Assistant

## 2022-08-06 DIAGNOSIS — E1010 Type 1 diabetes mellitus with ketoacidosis without coma: Secondary | ICD-10-CM

## 2022-08-06 DIAGNOSIS — E781 Pure hyperglyceridemia: Secondary | ICD-10-CM

## 2022-08-06 DIAGNOSIS — I10 Essential (primary) hypertension: Secondary | ICD-10-CM | POA: Diagnosis not present

## 2022-08-06 DIAGNOSIS — E1042 Type 1 diabetes mellitus with diabetic polyneuropathy: Secondary | ICD-10-CM

## 2022-08-06 MED ORDER — INSULIN LISPRO (1 UNIT DIAL) 100 UNIT/ML (KWIKPEN)
PEN_INJECTOR | SUBCUTANEOUS | 2 refills | Status: DC
Start: 2022-08-06 — End: 2023-03-17

## 2022-08-06 MED ORDER — TOUJEO SOLOSTAR 300 UNIT/ML ~~LOC~~ SOPN: 42.0000 [IU] | PEN_INJECTOR | Freq: Every day | SUBCUTANEOUS | 3 refills | Status: DC

## 2022-08-06 MED ORDER — ATORVASTATIN CALCIUM 40 MG PO TABS
40.0000 mg | ORAL_TABLET | Freq: Every day | ORAL | 4 refills | Status: DC
Start: 2022-08-06 — End: 2023-03-23

## 2022-08-06 MED ORDER — VALSARTAN-HYDROCHLOROTHIAZIDE 320-25 MG PO TABS
1.0000 | ORAL_TABLET | Freq: Every day | ORAL | 0 refills | Status: DC
Start: 2022-08-06 — End: 2023-03-23

## 2022-08-06 MED ORDER — FENOFIBRATE 145 MG PO TABS
145.0000 mg | ORAL_TABLET | Freq: Every day | ORAL | 0 refills | Status: DC
Start: 2022-08-06 — End: 2024-03-17

## 2022-08-06 MED ORDER — INSULIN PEN NEEDLE 31G X 8 MM MISC
5 refills | Status: AC
Start: 2022-08-06 — End: ?

## 2022-08-06 NOTE — Progress Notes (Signed)
Patient ID: Christopher Robles, male   DOB: 03/06/1991, 32 y.o.   MRN: [id number] Virtual Visit via Telephone Note  I connected with Christopher Robles on 08/06/22 at  8:50 AM EDT by telephone and verified that I am speaking with the correct person using two identifiers.  Location: Patient: home Provider: [facility] office   I discussed the limitations, risks, security and privacy concerns of performing an evaluation and management service by telephone and the availability of in person appointments. I also discussed with the patient that there may be a patient responsible charge related to this service. The patient expressed understanding and agreed to proceed.   History of Present Illness:  patient scheduled as virtual visit by [facility] but has not been in the office or had labs in almost a year.  No new issues or concerns.  He is taking 42 u long acting insulin daily and 0-12 units humalog daily  he works at [employer].  Blood sugars running 180 300s.  Does not follow diabetic diet.  Has not taken BP meds in a while and does not check BP OOO.  Denies HA/dizziness/polyuria/polydipsia.  Feels fine other than getting over a cold.      Observations/Objective:  visit had to be done by phone as my camera is not working(we did attempt).  NAD.  A&Ox3.  Speech is clear   Assessment and Plan: 1. Hypertriglyceridemia - Lipid panel; Future - atorvastatin (LIPITOR) 40 MG tablet; Take 1 tablet (40 mg total) by mouth daily.  Dispense: 30 tablet; Refill: 4 - fenofibrate (TRICOR) 145 MG tablet; Take 1 tablet (145 mg total) by mouth daily.  Dispense: 90 tablet; Refill: 0  2. Diabetic polyneuropathy associated with type 1 diabetes mellitus - Lipid panel; Future - atorvastatin (LIPITOR) 40 MG tablet; Take 1 tablet (40 mg total) by mouth daily.  Dispense: 30 tablet; Refill: 4 - insulin glargine, 1 Unit Dial, (TOUJEO SOLOSTAR) 300 UNIT/ML Solostar Pen; Inject 42 Units into the skin daily.  Dispense: 3 mL; Refill: 3  3. Essential  hypertension Resume meds and check BP OOO - Lipid panel; Future - valsartan-hydrochlorothiazide (DIOVAN-HCT) 320-25 MG tablet; Take 1 tablet by mouth daily.  Dispense: 90 tablet; Refill: 0  4. DM (diabetes mellitus) type 1, uncontrolled, with ketoacidosis Does not sound at goal based on home numbers - Hemoglobin A1c; Future - Comprehensive metabolic panel; Future - CBC with Differential/Platelet; Future - insulin glargine, 1 Unit Dial, (TOUJEO SOLOSTAR) 300 UNIT/ML Solostar Pen; Inject 42 Units into the skin daily.  Dispense: 3 mL; Refill: 3 - insulin lispro (HUMALOG KWIKPEN) 100 UNIT/ML KwikPen; Inject 0 to 12 units total subcutaneously three times daily before meals.  Dispense: 15 mL; Refill: 2 - Insulin Pen Needle 31G X 8 MM MISC; Use 4 times daily with meals and at bedtime as directed.  Dispense: 120 each; Refill: 5    Follow Up Instructions: Come in for labs within 2-3 weeks and PCP appt in 3 months([place] listed)   I discussed the assessment and treatment plan with the patient. The patient was provided an opportunity to ask questions and all were answered. The patient agreed with the plan and demonstrated an understanding of the instructions.   The patient was advised to call back or seek an in-person evaluation if the symptoms worsen or if the condition fails to improve as anticipated.  I provided 14 minutes of non-face-to-face time during this encounter.   Georgian Co, PA-C

## 2022-08-14 ENCOUNTER — Other Ambulatory Visit: Payer: Self-pay | Admitting: Critical Care Medicine

## 2022-08-14 DIAGNOSIS — F419 Anxiety disorder, unspecified: Secondary | ICD-10-CM

## 2022-11-05 ENCOUNTER — Ambulatory Visit: Payer: Managed Care, Other (non HMO) | Admitting: Family Medicine

## 2022-11-07 ENCOUNTER — Telehealth: Payer: Self-pay | Admitting: Family Medicine

## 2022-11-07 DIAGNOSIS — E1010 Type 1 diabetes mellitus with ketoacidosis without coma: Secondary | ICD-10-CM

## 2022-11-07 DIAGNOSIS — E1042 Type 1 diabetes mellitus with diabetic polyneuropathy: Secondary | ICD-10-CM

## 2022-11-07 NOTE — Telephone Encounter (Signed)
The patient called in stating his insulin glargine, 1 Unit Dial, (TOUJEO SOLOSTAR) 300 UNIT/ML Solostar Pen  is too expensive even with his insurance. He states his portion states he owes $88 a month and he doesn't understand why it is that much. He wants to know if there is any other alternative that would not be so quite expensive. Please assist patient further as he   [id number] - [place], [place] - [address] Phone: [phone]  Fax: [phone]

## 2022-11-07 NOTE — Telephone Encounter (Signed)
Routing to PCP for review.

## 2022-11-10 NOTE — Telephone Encounter (Signed)
Hi Luke, can you please check to see what other alternatives we have?  Thank you.

## 2022-11-11 ENCOUNTER — Other Ambulatory Visit (HOSPITAL_COMMUNITY): Payer: Self-pay

## 2022-11-11 ENCOUNTER — Other Ambulatory Visit: Payer: Self-pay

## 2022-11-11 MED ORDER — TOUJEO SOLOSTAR 300 UNIT/ML ~~LOC~~ SOPN
42.0000 [IU] | PEN_INJECTOR | Freq: Every day | SUBCUTANEOUS | 3 refills | Status: DC
Start: 2022-11-11 — End: 2022-12-09
  Filled 2022-11-11: qty 3, 21d supply, fill #0

## 2022-11-11 NOTE — Addendum Note (Signed)
Addended by: Lois Huxley, Jeannett Senior L on: 11/11/2022 03:30 PM   Modules accepted: Orders

## 2022-11-11 NOTE — Telephone Encounter (Signed)
Dianna Limbo! Could you help me here? I'm trying to run a test claim but cannot find his insurance. Are you able to tell me if he has a preferred alternative to Toujeo? If not, I can try to resend his Toujeo to our pharmacy and see if we can apply a copay card.

## 2022-12-05 ENCOUNTER — Other Ambulatory Visit: Payer: Self-pay | Admitting: Physician Assistant

## 2022-12-05 DIAGNOSIS — E1042 Type 1 diabetes mellitus with diabetic polyneuropathy: Secondary | ICD-10-CM

## 2022-12-05 DIAGNOSIS — E1010 Type 1 diabetes mellitus with ketoacidosis without coma: Secondary | ICD-10-CM

## 2023-01-06 ENCOUNTER — Telehealth: Payer: Self-pay

## 2023-01-06 ENCOUNTER — Other Ambulatory Visit: Payer: Self-pay | Admitting: Family Medicine

## 2023-01-06 DIAGNOSIS — E1010 Type 1 diabetes mellitus with ketoacidosis without coma: Secondary | ICD-10-CM

## 2023-01-06 DIAGNOSIS — E1042 Type 1 diabetes mellitus with diabetic polyneuropathy: Secondary | ICD-10-CM

## 2023-01-06 MED ORDER — TOUJEO SOLOSTAR 300 UNIT/ML ~~LOC~~ SOPN
42.0000 [IU] | PEN_INJECTOR | Freq: Every day | SUBCUTANEOUS | 0 refills | Status: DC
Start: 2023-01-06 — End: 2023-02-02

## 2023-01-06 NOTE — Telephone Encounter (Unsigned)
Copied from CRM [id number]. Topic: General - Other >> Jan 06, 2023  3:35 PM Everette C wrote: Reason for CRM: The patient has called to follow up on a previously submitted refill request of TOUJEO SOLOSTAR 300 UNIT/ML Solostar Pen [[id number]]  Please contact the patient further when possible

## 2023-01-06 NOTE — Telephone Encounter (Signed)
Medication Refill - Medication: TOUJEO SOLOSTAR 300 UNIT/ML Solostar Pen   Pt has been completely out for 2 days, needs urgent supply    Has the patient contacted their pharmacy? Yes.   (Agent: If no, request that the patient contact the pharmacy for the refill. If patient does not wish to contact the pharmacy document the reason why and proceed with request.) (Agent: If yes, when and what did the pharmacy advise?)  Preferred Pharmacy (with phone number or street name):  [facility] [address] [place], [place] - [address]  [address] [place] [address]  Phone: [phone] Fax: [phone]   Has the patient been seen for an appointment in the last year OR does the patient have an upcoming appointment? Yes.    Agent: Please be advised that RX refills may take up to 3 business days. We ask that you follow-up with your pharmacy.

## 2023-01-06 NOTE — Telephone Encounter (Signed)
Requested medication (s) are due for refill today: Yes  Requested medication (s) are on the active medication list: Yes  Last refill:  12/09/22  Future visit scheduled: Yes  Notes to clinic:  Unable to refill per protocol due to failed labs, no updated results.      Requested Prescriptions  Pending Prescriptions Disp Refills   insulin glargine, 1 Unit Dial, (TOUJEO SOLOSTAR) 300 UNIT/ML Solostar Pen 6 mL 0     Endocrinology:  Diabetes - Insulins Failed - 01/06/2023  3:50 PM      Failed - HBA1C is between 0 and 7.9 and within 180 days    HbA1c, POC (controlled diabetic range)  Date Value Ref Range Status  08/28/2021 8.2 (A) 0.0 - 7.0 % Final         Passed - Valid encounter within last 6 months    Recent Outpatient Visits           5 months ago Hypertriglyceridemia   [place] [facility] [place], [place], [place]   1 year ago DM (diabetes mellitus) type 1, uncontrolled, with ketoacidosis [facility])   [place] [facility] [place], [place], [place]   1 year ago Uncontrolled type 1 diabetes mellitus with hyperglycemia [facility])   [place] [facility] Storm Frisk, [place]   2 years ago Type 1 diabetes mellitus with hyperglycemia [facility])   [place] [facility] [place], Odette Horns, MD   3 years ago Type 1 diabetes mellitus with hyperglycemia [facility])    [facility] Hoy Register, MD       Future Appointments             In 2 months Hoy Register, MD [facility]

## 2023-01-06 NOTE — Telephone Encounter (Signed)
Medication has been refilled.

## 2023-01-22 ENCOUNTER — Other Ambulatory Visit: Payer: Self-pay | Admitting: Family Medicine

## 2023-01-22 DIAGNOSIS — F419 Anxiety disorder, unspecified: Secondary | ICD-10-CM

## 2023-01-22 NOTE — Telephone Encounter (Signed)
Requested Prescriptions  Pending Prescriptions Disp Refills   hydrOXYzine (ATARAX) 25 MG tablet [Pharmacy Med Name: hydrOXYzine HCl 25 MG Oral Tablet] 60 tablet 0    Sig: Take 1 tablet by mouth three times daily as needed     Ear, Nose, and Throat:  Antihistamines 2 Failed - 01/22/2023  3:12 AM      Failed - Cr in normal range and within 360 days    Creat  Date Value Ref Range Status  11/30/2014 0.91 0.60 - 1.35 mg/dL Final   Creatinine, Ser  Date Value Ref Range Status  08/28/2021 0.85 0.76 - 1.27 mg/dL Final   Creatinine, Urine  Date Value Ref Range Status  03/08/2015 159 20 - 370 mg/dL Final    Comment:    ** Please note change in reference range(s). **            Passed - Valid encounter within last 12 months    Recent Outpatient Visits           5 months ago Hypertriglyceridemia   [place] [facility] [place], [place], [place]   1 year ago DM (diabetes mellitus) type 1, uncontrolled, with ketoacidosis [facility])   [place] [facility] [place], [employer], [place]   1 year ago Uncontrolled type 1 diabetes mellitus with hyperglycemia [facility])   [place] [facility] Storm Frisk, MD   2 years ago Type 1 diabetes mellitus with hyperglycemia [facility])   [place] [facility] [place], Odette Horns, MD   3 years ago Type 1 diabetes mellitus with hyperglycemia [facility])   [place] [facility] Hoy Register, MD       Future Appointments             In 2 months Hoy Register, MD [facility] & [facility]

## 2023-01-29 ENCOUNTER — Other Ambulatory Visit: Payer: Self-pay | Admitting: Family Medicine

## 2023-01-29 DIAGNOSIS — F419 Anxiety disorder, unspecified: Secondary | ICD-10-CM

## 2023-01-29 NOTE — Telephone Encounter (Signed)
Medication Refill - Medication: hydrOXYzine (ATARAX) 25 MG tablet [[id number]]   Pt has been out of medication for 2 days.  Has the patient contacted their pharmacy? Yes.   (Agent: If no, request that the patient contact the pharmacy for the refill. If patient does not wish to contact the pharmacy document the reason why and proceed with request.) (Agent: If yes, when and what did the pharmacy advise?)  Preferred Pharmacy (with phone number or street name):  [facility] [address] [place], [place] - [address] Phone: [phone]  Fax: [phone]     Has the patient been seen for an appointment in the last year OR does the patient have an upcoming appointment? Yes.    Agent: Please be advised that RX refills may take up to 3 business days. We ask that you follow-up with your pharmacy.

## 2023-01-30 MED ORDER — HYDROXYZINE HCL 25 MG PO TABS: 25.0000 mg | ORAL_TABLET | Freq: Three times a day (TID) | ORAL | 0 refills | Status: AC | PRN

## 2023-01-30 NOTE — Telephone Encounter (Signed)
Requested Prescriptions  Pending Prescriptions Disp Refills   hydrOXYzine (ATARAX) 25 MG tablet 270 tablet 0    Sig: Take 1 tablet (25 mg total) by mouth 3 (three) times daily as needed.     Ear, Nose, and Throat:  Antihistamines 2 Failed - 01/30/2023  8:52 AM      Failed - Cr in normal range and within 360 days    Creat  Date Value Ref Range Status  11/30/2014 0.91 0.60 - 1.35 mg/dL Final   Creatinine, Ser  Date Value Ref Range Status  08/28/2021 0.85 0.76 - 1.27 mg/dL Final   Creatinine, Urine  Date Value Ref Range Status  03/08/2015 159 20 - 370 mg/dL Final    Comment:    ** Please note change in reference range(s). **            Passed - Valid encounter within last 12 months    Recent Outpatient Visits           5 months ago Hypertriglyceridemia   [place] [facility] [place], [place], [place]   1 year ago DM (diabetes mellitus) type 1, uncontrolled, with ketoacidosis [facility])   [place] [facility] [place], [place], [place]   1 year ago Uncontrolled type 1 diabetes mellitus with hyperglycemia [facility])   [place] [facility] Storm Frisk, MD   2 years ago Type 1 diabetes mellitus with hyperglycemia [facility])   [place] [facility] [place], Odette Horns, MD   3 years ago Type 1 diabetes mellitus with hyperglycemia [facility])   [place] [facility] Hoy Register, MD       Future Appointments             In 1 month Hoy Register, MD [facility] & [facility]

## 2023-02-02 ENCOUNTER — Other Ambulatory Visit: Payer: Self-pay | Admitting: Pharmacist

## 2023-02-02 ENCOUNTER — Other Ambulatory Visit: Payer: Self-pay | Admitting: Family Medicine

## 2023-02-02 DIAGNOSIS — E1010 Type 1 diabetes mellitus with ketoacidosis without coma: Secondary | ICD-10-CM

## 2023-02-02 DIAGNOSIS — E1042 Type 1 diabetes mellitus with diabetic polyneuropathy: Secondary | ICD-10-CM

## 2023-02-02 MED ORDER — TOUJEO SOLOSTAR 300 UNIT/ML ~~LOC~~ SOPN
42.0000 [IU] | PEN_INJECTOR | Freq: Every day | SUBCUTANEOUS | 0 refills | Status: DC
Start: 2023-02-02 — End: 2023-02-26

## 2023-02-02 NOTE — Telephone Encounter (Signed)
Requested medication (s) are due for refill today: Yes  Requested medication (s) are on the active medication list: Yes  Last refill:  01/06/23  Future visit scheduled: Yes  Notes to clinic:  Unable to refill per protocol due to failed labs, no updated results.      Requested Prescriptions  Pending Prescriptions Disp Refills   insulin glargine, 1 Unit Dial, (TOUJEO SOLOSTAR) 300 UNIT/ML Solostar Pen 6 mL 0    Sig: Inject 42 Units into the skin daily. Needs an OV for additional refills.     Endocrinology:  Diabetes - Insulins Failed - 02/02/2023 11:14 AM      Failed - HBA1C is between 0 and 7.9 and within 180 days    HbA1c, POC (controlled diabetic range)  Date Value Ref Range Status  08/28/2021 8.2 (A) 0.0 - 7.0 % Final         Passed - Valid encounter within last 6 months    Recent Outpatient Visits           6 months ago Hypertriglyceridemia   [place] [place], [place], [place]   1 year ago DM (diabetes mellitus) type 1, uncontrolled, with ketoacidosis [facility])   [place] [facility] [place], [place], [place]   1 year ago Uncontrolled type 1 diabetes mellitus with hyperglycemia [facility])   [place] [facility] Storm Frisk, MD   2 years ago Type 1 diabetes mellitus with hyperglycemia [facility])   [place] [facility] [place], Odette Horns, MD   3 years ago Type 1 diabetes mellitus with hyperglycemia [facility])   [place] [facility] Hoy Register, MD       Future Appointments             In 1 month Hoy Register, MD [place] [facility]

## 2023-02-02 NOTE — Telephone Encounter (Signed)
Medication Refill - Medication:  insulin glargine, 1 Unit Dial, (TOUJEO SOLOSTAR) 300 UNIT/ML Solostar Pen   *been out for a week & patient states this is his 3rd time calling for this request  Has the patient contacted their pharmacy? Yes, advised to contact PCP  Preferred Pharmacy (with phone number or street name):  [facility] [address] [place], [place] - [address]  Phone: [phone] Fax: [phone]   Has the patient been seen for an appointment in the last year OR does the patient have an upcoming appointment? Yes, F/U scheduled with PCP next available on 11.25.24

## 2023-02-26 ENCOUNTER — Other Ambulatory Visit: Payer: Self-pay | Admitting: Family Medicine

## 2023-02-26 DIAGNOSIS — E1042 Type 1 diabetes mellitus with diabetic polyneuropathy: Secondary | ICD-10-CM

## 2023-02-26 DIAGNOSIS — E1010 Type 1 diabetes mellitus with ketoacidosis without coma: Secondary | ICD-10-CM

## 2023-02-26 NOTE — Telephone Encounter (Signed)
Requested medication (s) are due for refill today:   Yes  Requested medication (s) are on the active medication list:   Yes  Future visit scheduled:   Yes 11/15 with Dr. Alvis Lemmings   Last ordered: 02/02/2023 6 ml, 0 refills  Unable to refill because A1C due per protocol and a 6 mo. Appt which is upcoming.   Returned for provider to review for refills prior to appt.   Requested Prescriptions  Pending Prescriptions Disp Refills   TOUJEO SOLOSTAR 300 UNIT/ML Solostar Pen [Pharmacy Med Name: Toujeo SoloStar 300 UNIT/ML Subcutaneous Solution Pen-injector] 6 mL 0    Sig: INJECT 42 UNITS SUBCUTANEOUSLY ONCE DAILY     Endocrinology:  Diabetes - Insulins Failed - 02/26/2023  2:37 PM      Failed - HBA1C is between 0 and 7.9 and within 180 days    HbA1c, POC (controlled diabetic range)  Date Value Ref Range Status  08/28/2021 8.2 (A) 0.0 - 7.0 % Final         Failed - Valid encounter within last 6 months    Recent Outpatient Visits           6 months ago Hypertriglyceridemia   [place] [facility] [place], [place], [place]   1 year ago DM (diabetes mellitus) type 1, uncontrolled, with ketoacidosis [facility])   [place] [facility] [employer], [place], [place]   1 year ago Uncontrolled type 1 diabetes mellitus with hyperglycemia [facility])   [place] [facility] [place] [facility] Storm Frisk, MD   2 years ago Type 1 diabetes mellitus with hyperglycemia [facility])   [place] [facility] [place], Odette Horns, MD   3 years ago Type 1 diabetes mellitus with hyperglycemia [facility])   [place] [facility] & [facility] Hoy Register, MD       Future Appointments             In 3 weeks Hoy Register, MD [facility]

## 2023-03-06 ENCOUNTER — Other Ambulatory Visit: Payer: Self-pay | Admitting: Family Medicine

## 2023-03-06 DIAGNOSIS — E1010 Type 1 diabetes mellitus with ketoacidosis without coma: Secondary | ICD-10-CM

## 2023-03-06 DIAGNOSIS — E1042 Type 1 diabetes mellitus with diabetic polyneuropathy: Secondary | ICD-10-CM

## 2023-03-08 ENCOUNTER — Other Ambulatory Visit: Payer: Self-pay | Admitting: Family Medicine

## 2023-03-08 DIAGNOSIS — E1042 Type 1 diabetes mellitus with diabetic polyneuropathy: Secondary | ICD-10-CM

## 2023-03-08 DIAGNOSIS — E1010 Type 1 diabetes mellitus with ketoacidosis without coma: Secondary | ICD-10-CM

## 2023-03-09 NOTE — Telephone Encounter (Signed)
Requested by interface surescripts. Signed 03/06/23 receipt confirmed by pharmacy 12:19 pm. Future visit in 2 weeks .  Requested Prescriptions  Pending Prescriptions Disp Refills   TOUJEO SOLOSTAR 300 UNIT/ML Solostar Pen [Pharmacy Med Name: Toujeo SoloStar 300 UNIT/ML Subcutaneous Solution Pen-injector] 6 mL 0    Sig: INJECT 42 UNITS SUBCUTANEOUSLY ONCE DAILY     Endocrinology:  Diabetes - Insulins Failed - 03/08/2023  4:54 AM      Failed - HBA1C is between 0 and 7.9 and within 180 days    HbA1c, POC (controlled diabetic range)  Date Value Ref Range Status  08/28/2021 8.2 (A) 0.0 - 7.0 % Final         Failed - Valid encounter within last 6 months    Recent Outpatient Visits           7 months ago Hypertriglyceridemia   [place] [facility] [place]. [facility] [place], [place], [place]   1 year ago DM (diabetes mellitus) type 1, uncontrolled, with ketoacidosis (HCC)   [place] [facility] [place]. [facility] [place], [place], [place]   1 year ago Uncontrolled type 1 diabetes mellitus with hyperglycemia [facility])   [place] [facility] - [facility] [place]. [facility] Storm Frisk, MD   2 years ago Type 1 diabetes mellitus with hyperglycemia [facility])   [place] [facility] - [facility] Of [place]. [facility] Hoy Register, MD   3 years ago Type 1 diabetes mellitus with hyperglycemia [facility])   [place] [facility] - [facility] Of [place]. [facility] Hoy Register, MD       Future Appointments             In 2 weeks Hoy Register, MD [facility] [place] - [facility] Dept Of Eligha Bridegroom. [facility]

## 2023-03-16 ENCOUNTER — Other Ambulatory Visit: Payer: Self-pay | Admitting: Physician Assistant

## 2023-03-16 DIAGNOSIS — E1010 Type 1 diabetes mellitus with ketoacidosis without coma: Secondary | ICD-10-CM

## 2023-03-17 NOTE — Telephone Encounter (Signed)
Requested Prescriptions  Pending Prescriptions Disp Refills   insulin lispro (HUMALOG) 100 UNIT/ML KwikPen [Pharmacy Med Name: Insulin Lispro (1 Unit Dial) 100 UNIT/ML Subcutaneous Solution Pen-injector] 15 mL 0    Sig: INJECT 0 TO 12 UNITS TOTAL SUBCUTANEOUSLY 3 TIMES DAILY BEFORE MEALS     Endocrinology:  Diabetes - Insulins Failed - 03/16/2023  1:21 AM      Failed - HBA1C is between 0 and 7.9 and within 180 days    HbA1c, POC (controlled diabetic range)  Date Value Ref Range Status  08/28/2021 8.2 (A) 0.0 - 7.0 % Final         Failed - Valid encounter within last 6 months    Recent Outpatient Visits           7 months ago Hypertriglyceridemia   [place] [facility] [place]. [facility] [place], [place], [place]   1 year ago DM (diabetes mellitus) type 1, uncontrolled, with ketoacidosis (HCC)   [place] [facility] [place]. [facility] [place], [place], [place]   1 year ago Uncontrolled type 1 diabetes mellitus with hyperglycemia [facility])   [place] [facility] - [facility] Of [place]. [facility] Storm Frisk, MD   2 years ago Type 1 diabetes mellitus with hyperglycemia [facility])   [place] [facility] - [facility] [place]. [facility] Hoy Register, MD   3 years ago Type 1 diabetes mellitus with hyperglycemia [facility])   [place] [facility] - A Dept Of [place]. [facility] Hoy Register, MD       Future Appointments             In 6 days Hoy Register, MD [facility] [place] - [facility] Dept Of Eligha Bridegroom. [facility]

## 2023-03-23 ENCOUNTER — Encounter: Payer: Self-pay | Admitting: Family Medicine

## 2023-03-23 ENCOUNTER — Ambulatory Visit: Payer: Managed Care, Other (non HMO) | Attending: Family Medicine | Admitting: Family Medicine

## 2023-03-23 VITALS — BP 169/103 | HR 89 | Ht 65.0 in | Wt 190.0 lb

## 2023-03-23 DIAGNOSIS — I152 Hypertension secondary to endocrine disorders: Secondary | ICD-10-CM

## 2023-03-23 DIAGNOSIS — E782 Mixed hyperlipidemia: Secondary | ICD-10-CM

## 2023-03-23 DIAGNOSIS — E1059 Type 1 diabetes mellitus with other circulatory complications: Secondary | ICD-10-CM

## 2023-03-23 DIAGNOSIS — E1042 Type 1 diabetes mellitus with diabetic polyneuropathy: Secondary | ICD-10-CM

## 2023-03-23 DIAGNOSIS — Z794 Long term (current) use of insulin: Secondary | ICD-10-CM | POA: Diagnosis not present

## 2023-03-23 DIAGNOSIS — E1010 Type 1 diabetes mellitus with ketoacidosis without coma: Secondary | ICD-10-CM

## 2023-03-23 DIAGNOSIS — E1069 Type 1 diabetes mellitus with other specified complication: Secondary | ICD-10-CM

## 2023-03-23 DIAGNOSIS — Z91141 Patient's other noncompliance with medication regimen due to financial hardship: Secondary | ICD-10-CM | POA: Diagnosis not present

## 2023-03-23 LAB — POCT GLYCOSYLATED HEMOGLOBIN (HGB A1C): HbA1c, POC (controlled diabetic range): 8.3 % — AB (ref 0.0–7.0)

## 2023-03-23 MED ORDER — INSULIN LISPRO (1 UNIT DIAL) 100 UNIT/ML (KWIKPEN): PEN_INJECTOR | SUBCUTANEOUS | 3 refills | Status: DC

## 2023-03-23 MED ORDER — ATORVASTATIN CALCIUM 40 MG PO TABS: 40.0000 mg | ORAL_TABLET | Freq: Every day | ORAL | 4 refills | Status: DC

## 2023-03-23 MED ORDER — TOUJEO SOLOSTAR 300 UNIT/ML ~~LOC~~ SOPN: 45.0000 [IU] | PEN_INJECTOR | Freq: Every day | SUBCUTANEOUS | 6 refills | Status: DC

## 2023-03-23 MED ORDER — VALSARTAN-HYDROCHLOROTHIAZIDE 320-25 MG PO TABS: 1.0000 | ORAL_TABLET | Freq: Every day | ORAL | 1 refills | Status: DC

## 2023-03-23 NOTE — Patient Instructions (Signed)
VISIT SUMMARY:  You came in today after a year-long gap in your care due to work and financial constraints. We discussed your diabetes management, blood pressure, and general health maintenance. You reported challenges with medication adherence and some new symptoms, including vision difficulties and occasional irregular heartbeats.  YOUR PLAN:  -TYPE 2 DIABETES MELLITUS: Type 2 Diabetes Mellitus is a condition where your body does not use insulin properly, leading to high blood sugar levels. Your A1c is currently 8.3, which is above the target of 7.0. We have increased your Toujeo dose from 42 units to 45 units nightly and provided you with a new sliding scale for Humalog. Please adjust your doses based on this scale. We will check your A1c again in 3 months to see how the changes are working. Our pharmacist will also look into options for a copay card to help with medication costs.  -HYPERTENSION: Hypertension, or high blood pressure, is when the force of your blood against your artery walls is too high. You have been off your blood pressure medication for several months. We have refilled your prescription and advise you to restart it as prescribed. We will check your blood pressure at your next visit to see how well it is controlled.  -GENERAL HEALTH MAINTENANCE: We have referred you to an ophthalmologist for an eye exam due to your diabetes. We are also considering a continuous glucose monitor to reduce the need for fingerstick blood glucose checks, depending on the cost. Please follow up in 3 months to assess your blood pressure control, response to the increased Toujeo dose, and overall diabetes management.  INSTRUCTIONS:  Please follow up in 3 months to assess your blood pressure control, response to the increased Toujeo dose, and overall diabetes management. Make sure to get an eye exam with the ophthalmologist as referred.

## 2023-03-23 NOTE — Progress Notes (Signed)
Subjective:  Patient ID: Christopher Robles, male    DOB: February 26, 1991  Age: 32 y.o. MRN: [id number]  CC: Medical Management of Chronic Issues   HPI SHIV LAUER is a 32 y.o. year old male with a history of type 1 diabetes mellitus (A1c 8.3), hypertension here for follow-up visit.   Interval History: Discussed the use of AI scribe software for clinical note transcription with the patient, who gave verbal consent to proceed.  He presents after a year-long gap in care due to work and financial constraints. He reports working twelve-hour shifts daily and struggling to afford his medications. He has been taking his insulin, Humalog and Toujeo, but has not taken his cholesterol medication for approximately six to seven months. He also takes a multivitamin and fish oil.  The patient reports that he often takes less than the prescribed twelve units of Humalog, usually opting for eight to nine units, as he has experienced hypoglycemia with higher doses, particularly when physically active.  I had previously given him a copy of the sliding scale which he does not have with him and has been dosing this based on his feelings.  He takes forty-two units of Toujeo nightly and has noticed a significant increase in his blood glucose levels when he ran out of this medication for about a week and a half.  The patient also reports difficulty with vision and has not had an eye exam recently. He expresses interest in a continuous glucose monitor but is concerned about the cost.  The patient works night shifts and often skips meals during the day, eating mostly at night. He reports occasional tingling in his feet but no numbness. He has had issues with ingrown toenails and his feet sometimes turn purple and bruise, particularly after long shifts.        Past Medical History:  Diagnosis Date   Alcohol-induced acute pancreatitis with uninfected necrosis    Anxiety    DM I (diabetes mellitus, type I), uncontrolled     ETOH abuse    Seizure (HCC)     Past Surgical History:  Procedure Laterality Date   APPENDECTOMY      Family History  Problem Relation Age of Onset   Cancer Maternal Aunt    Cancer Maternal Uncle    Heart disease Paternal Grandfather    Anxiety disorder Mother    Bipolar disorder Brother     Social History   Socioeconomic History   Marital status: Single    Spouse name: Not on file   Number of children: Not on file   Years of education: Not on file   Highest education level: Not on file  Occupational History   Not on file  Tobacco Use   Smoking status: Never   Smokeless tobacco: Never  Substance and Sexual Activity   Alcohol use: Yes    Alcohol/week: 40.0 standard drinks of alcohol    Types: 40 Cans of beer per week    Comment: last drink 2 months ago   Drug use: Yes    Types: Marijuana    Comment: last time this morning   Sexual activity: Not on file  Other Topics Concern   Not on file  Social History Narrative   Not on file   Social Determinants of Health   Financial Resource Strain: Not on file  Food Insecurity: Not on file  Transportation Needs: Not on file  Physical Activity: Not on file  Stress: Not on file  Social Connections:  Not on file    No Known Allergies  Outpatient Medications Prior to Visit  Medication Sig Dispense Refill   insulin lispro (HUMALOG) 100 UNIT/ML KwikPen INJECT 0 TO 12 UNITS TOTAL SUBCUTANEOUSLY 3 TIMES DAILY BEFORE MEALS 15 mL 0   TOUJEO SOLOSTAR 300 UNIT/ML Solostar Pen INJECT 42 UNITS SUBCUTANEOUSLY ONCE DAILY 6 mL 0   blood glucose meter kit and supplies Dispense based on patient and insurance preference. Use up to four times daily as directed. (FOR ICD-9 250.00, 250.01). (Patient not taking: Reported on 03/23/2023) 1 each 0   fenofibrate (TRICOR) 145 MG tablet Take 1 tablet (145 mg total) by mouth daily. (Patient not taking: Reported on 03/23/2023) 90 tablet 0   hydrOXYzine (ATARAX) 25 MG tablet Take 1 tablet (25 mg  total) by mouth 3 (three) times daily as needed. (Patient not taking: Reported on 03/23/2023) 270 tablet 0   Insulin Pen Needle 31G X 8 MM MISC Use 4 times daily with meals and at bedtime as directed. (Patient not taking: Reported on 03/23/2023) 120 each 5   Multiple Vitamin (MULTIVITAMIN WITH MINERALS) TABS tablet Take 1 tablet by mouth daily. (Patient not taking: Reported on 03/23/2023) 30 tablet 0   TRUEPLUS INSULIN SYRINGE 30G X 5/16" 0.5 ML MISC USE TO INJECT INSULIN DAILY AS DIRECTED BY PHYSICIAN (Patient not taking: Reported on 03/23/2023) 100 each 12   atorvastatin (LIPITOR) 40 MG tablet Take 1 tablet (40 mg total) by mouth daily. (Patient not taking: Reported on 03/23/2023) 30 tablet 4   valsartan-hydrochlorothiazide (DIOVAN-HCT) 320-25 MG tablet Take 1 tablet by mouth daily. (Patient not taking: Reported on 03/23/2023) 90 tablet 0   No facility-administered medications prior to visit.     ROS Review of Systems  Constitutional:  Negative for activity change and appetite change.  HENT:  Negative for sinus pressure and sore throat.   Respiratory:  Negative for chest tightness, shortness of breath and wheezing.   Cardiovascular:  Negative for chest pain and palpitations.  Gastrointestinal:  Negative for abdominal distention, abdominal pain and constipation.  Genitourinary: Negative.   Musculoskeletal: Negative.   Psychiatric/Behavioral:  Negative for behavioral problems and dysphoric mood.     Objective:  BP (!) 169/103   Pulse 89   Ht 5\' 5"  (1.651 m)   Wt 190 lb (86.2 kg)   SpO2 99%   BMI 31.62 kg/m      03/23/2023    4:06 PM 08/28/2021    3:11 PM 08/28/2021    2:51 PM  BP/Weight  Systolic BP 169 130 146  Diastolic BP 103 82 100  Wt. (Lbs) 190  193.2  BMI 31.62 kg/m2  32.15 kg/m2      Physical Exam Constitutional:      Appearance: He is well-developed.  Cardiovascular:     Rate and Rhythm: Normal rate.     Heart sounds: Normal heart sounds. No murmur  heard. Pulmonary:     Effort: Pulmonary effort is normal.     Breath sounds: Normal breath sounds. No wheezing or rales.  Chest:     Chest wall: No tenderness.  Abdominal:     General: Bowel sounds are normal. There is no distension.     Palpations: Abdomen is soft. There is no mass.     Tenderness: There is no abdominal tenderness.  Musculoskeletal:        General: Normal range of motion.     Right lower leg: No edema.     Left lower leg: No edema.  Neurological:     Mental Status: He is alert and oriented to person, place, and time.  Psychiatric:        Mood and Affect: Mood normal.    Diabetic Foot Exam - Simple   Simple Foot Form Diabetic Foot exam was performed with the following findings: Yes 03/23/2023  4:30 PM  Visual Inspection No deformities, no ulcerations, no other skin breakdown bilaterally: Yes Sensation Testing Intact to touch and monofilament testing bilaterally: Yes Pulse Check Posterior Tibialis and Dorsalis pulse intact bilaterally: Yes Comments         Latest Ref Rng & Units 08/28/2021    3:30 PM 04/02/2021   11:29 AM 06/28/2019    3:55 PM  CMP  Glucose 70 - 99 mg/dL 409  811  914   BUN 6 - 20 mg/dL 9  13  11    Creatinine 0.76 - 1.27 mg/dL 7.82  9.56  2.13   Sodium 134 - 144 mmol/L 134  133  137   Potassium 3.5 - 5.2 mmol/L 4.3  4.3  4.8   Chloride 96 - 106 mmol/L 93  92  96   CO2 20 - 29 mmol/L 23  23  25    Calcium 8.7 - 10.2 mg/dL 9.8  9.9  08.6   Total Protein 6.0 - 8.5 g/dL 7.1  6.9    Total Bilirubin 0.0 - 1.2 mg/dL 0.5  0.3    Alkaline Phos 44 - 121 IU/L 111  124    AST 0 - 40 IU/L 39  68    ALT 0 - 44 IU/L 40  41      Lipid Panel     Component Value Date/Time   CHOL 304 (H) 08/28/2021 1530   TRIG 801 (HH) 08/28/2021 1530   HDL 46 08/28/2021 1530   CHOLHDL 6.6 (H) 08/28/2021 1530   CHOLHDL 2.1 03/27/2015 0938   VLDL NOT CALC 03/27/2015 0938   LDLCALC Comment (A) 08/28/2021 1530    CBC    Component Value Date/Time   WBC 8.1  04/02/2021 1129   WBC 9.2 03/23/2016 0536   RBC 4.23 04/02/2021 1129   RBC 4.30 03/23/2016 0536   HGB 14.8 04/02/2021 1129   HCT 42.0 04/02/2021 1129   PLT 232 04/02/2021 1129   MCV 99 (H) 04/02/2021 1129   MCH 35.0 (H) 04/02/2021 1129   MCH 35.3 (H) 03/23/2016 0536   MCHC 35.2 04/02/2021 1129   MCHC 35.1 03/23/2016 0536   RDW 12.4 04/02/2021 1129   LYMPHSABS 2.6 04/02/2021 1129   MONOABS 0.9 03/22/2016 0014   EOSABS 0.3 04/02/2021 1129   BASOSABS 0.1 04/02/2021 1129    Lab Results  Component Value Date   HGBA1C 8.3 (A) 03/23/2023    Assessment & Plan:      Type 1 Diabetes Mellitus A1c of 8.3, above target of 7.0. Patient reports inconsistent use of Humalog and Toujeo due to cost and work schedule. Patient has been self-adjusting insulin doses based on personal experience rather than a sliding scale. -Increase Toujeo from 42 units to 45 units nightly. -Provide patient with a new sliding scale for Humalog and advise to adjust doses based on this scale. -Check A1c in 3 months to assess response to increased Toujeo dose. -Pharmacist to explore options for copay card to assist with medication costs. -Counseled on Diabetic diet, my plate method, 578 minutes of moderate intensity exercise/week Blood sugar logs with fasting goals of 80-120 mg/dl, random of less than 469 and in the  event of sugars less than 60 mg/dl or greater than 161 mg/dl encouraged to notify the clinic. Advised on the need for annual eye exams, annual foot exams, Pneumonia vaccine.   Hypertension Patient has been off medication for several months due to cost and access issues. -Refill blood pressure medication and advise patient to restart as prescribed. -Check blood pressure at next visit to assess control. -Counseled on blood pressure goal of less than 130/80, low-sodium, DASH diet, medication compliance, 150 minutes of moderate intensity exercise per week. Discussed medication compliance, adverse  effects.   Hyperlipidemia associated with type 1 diabetes mellitus -Uncontrolled -He has been without his atorvastatin for a while and this has been refilled -Low-cholesterol diet  Medication nonadherence due to financial constraint -[facility]  General Health Maintenance -Referral to ophthalmologist for eye exam due to diabetes. -Consider continuous glucose monitor to reduce need for fingerstick blood glucose checks, pending cost assessment. -Follow-up appointment in 3 months to assess blood pressure control, response to increased Toujeo dose, and overall diabetes management.          Meds ordered this encounter  Medications   atorvastatin (LIPITOR) 40 MG tablet    Sig: Take 1 tablet (40 mg total) by mouth daily.    Dispense:  30 tablet    Refill:  4   insulin glargine, 1 Unit Dial, (TOUJEO SOLOSTAR) 300 UNIT/ML Solostar Pen    Sig: Inject 45 Units into the skin at bedtime.    Dispense:  6 mL    Refill:  6    Dose increase   valsartan-hydrochlorothiazide (DIOVAN-HCT) 320-25 MG tablet    Sig: Take 1 tablet by mouth daily.    Dispense:  90 tablet    Refill:  1    Follow-up: Return in about 3 months (around 06/23/2023) for Chronic medical conditions.       Hoy Register, MD, FAAFP. [facility] [place], [place] [phone]   03/23/2023, 5:00 PM

## 2023-03-25 LAB — CMP14+EGFR
ALT: 33 [IU]/L (ref 0–44)
AST: 37 [IU]/L (ref 0–40)
Albumin: 4.2 g/dL (ref 4.1–5.1)
Alkaline Phosphatase: 129 [IU]/L — ABNORMAL HIGH (ref 44–121)
BUN/Creatinine Ratio: 9 (ref 9–20)
BUN: 8 mg/dL (ref 6–20)
Bilirubin Total: 0.5 mg/dL (ref 0.0–1.2)
CO2: 21 mmol/L (ref 20–29)
Calcium: 10 mg/dL (ref 8.7–10.2)
Chloride: 88 mmol/L — ABNORMAL LOW (ref 96–106)
Creatinine, Ser: 0.85 mg/dL (ref 0.76–1.27)
Globulin, Total: 2.5 g/dL (ref 1.5–4.5)
Glucose: 363 mg/dL — ABNORMAL HIGH (ref 70–99)
Potassium: 4.6 mmol/L (ref 3.5–5.2)
Sodium: 129 mmol/L — ABNORMAL LOW (ref 134–144)
Total Protein: 6.7 g/dL (ref 6.0–8.5)
eGFR: 118 mL/min/{1.73_m2} (ref >59)

## 2023-03-25 LAB — MICROALBUMIN / CREATININE URINE RATIO
Creatinine, Urine: 38.5 mg/dL
Microalb/Creat Ratio: 83 mg/g{creat} — ABNORMAL HIGH (ref 0–29)
Microalbumin, Urine: 32.1 ug/mL

## 2023-11-10 ENCOUNTER — Telehealth: Payer: Self-pay | Admitting: Family Medicine

## 2023-11-10 NOTE — Telephone Encounter (Signed)
 Copied from CRM [id number]. Topic: Clinical - Medication Question >> Nov 10, 2023  2:18 PM Christopher Robles wrote:  Reason for CRM: insulin  glargine, 1 Unit Dial , (TOUJEO  SOLOSTAR) 300 UNIT/ML Solostar Pen- patient asking if he can get the coupon again due to the high cost- [phone]

## 2023-11-10 NOTE — Telephone Encounter (Signed)
 Is there any coupons this patient can use for his insulin 

## 2023-11-11 ENCOUNTER — Other Ambulatory Visit: Payer: Self-pay

## 2023-11-12 NOTE — Telephone Encounter (Signed)
 Noted

## 2023-12-08 ENCOUNTER — Telehealth: Payer: Self-pay | Admitting: Family Medicine

## 2023-12-08 NOTE — Telephone Encounter (Signed)
 Copied from CRM [id number]. Topic: Clinical - Prescription Issue >> Dec 08, 2023  3:47 PM Wess RAMAN wrote: Reason for CRM: Patient states insulin  glargine, 1 Unit Dial , (TOUJEO  SOLOSTAR) 300 UNIT/ML Solostar Pen is too expensive. He would like to get another coupon or see what he needs to do to be able to afford his medicine  Callback #: [phone]  Preferred Pharmacy

## 2023-12-08 NOTE — Telephone Encounter (Signed)
 fyi

## 2023-12-10 ENCOUNTER — Telehealth: Payer: Self-pay

## 2023-12-10 NOTE — Telephone Encounter (Signed)
 Attempted to contact patient and inform him that he needs to let the pharmacy know to apply his copay card to his refill.         Copied from CRM [id number]. Topic: Clinical - Prescription Issue >> Dec 08, 2023  3:47 PM Wess RAMAN wrote: Reason for CRM: Patient states insulin  glargine, 1 Unit Dial , (TOUJEO  SOLOSTAR) 300 UNIT/ML Solostar Pen is too expensive. He would like to get another coupon or see what he needs to do to be able to afford his medicine  Callback #: [phone]  Preferred Pharmacy >> Dec 10, 2023  8:40 AM Larissa RAMAN wrote: Patient calling to check the status of coupon for insulin . Patient is requesting a callback

## 2024-01-10 ENCOUNTER — Other Ambulatory Visit: Payer: Self-pay | Admitting: Family Medicine

## 2024-01-10 DIAGNOSIS — Z794 Long term (current) use of insulin: Secondary | ICD-10-CM

## 2024-01-10 DIAGNOSIS — E1010 Type 1 diabetes mellitus with ketoacidosis without coma: Secondary | ICD-10-CM

## 2024-01-10 DIAGNOSIS — E1042 Type 1 diabetes mellitus with diabetic polyneuropathy: Secondary | ICD-10-CM

## 2024-02-11 ENCOUNTER — Other Ambulatory Visit: Payer: Self-pay | Admitting: Family Medicine

## 2024-02-11 DIAGNOSIS — E1010 Type 1 diabetes mellitus with ketoacidosis without coma: Secondary | ICD-10-CM

## 2024-02-11 DIAGNOSIS — E1042 Type 1 diabetes mellitus with diabetic polyneuropathy: Secondary | ICD-10-CM

## 2024-02-11 DIAGNOSIS — Z794 Long term (current) use of insulin: Secondary | ICD-10-CM

## 2024-02-15 NOTE — Telephone Encounter (Signed)
 Copied from CRM [id number]. Topic: Clinical - Medication Refill >> Feb 15, 2024  2:50 PM Delon HERO wrote: Medication: insulin  glargine, 1 Unit Dial , (TOUJEO  SOLOSTAR) 300 UNIT/ML Solostar Pen [[id number]]  Has the patient contacted their pharmacy? Yes (Agent: If no, request that the patient contact the pharmacy for the refill. If patient does not wish to contact the pharmacy document the reason why and proceed with request.) (Agent: If yes, when and what did the pharmacy advise?)  This is the patient's preferred pharmacy:  [facility] [address], [place] - [address] [place] [address] Phone: [phone] Fax: [phone]    Is this the correct pharmacy for this prescription? Yes If no, delete pharmacy and type the correct one.   Has the prescription been filled recently? Yes  Is the patient out of the medication? Yes apt 03/17/2024  Has the patient been seen for an appointment in the last year OR does the patient have an upcoming appointment? Yes  Can we respond through MyChart? Yes  Agent: Please be advised that Rx refills may take up to 3 business days. We ask that you follow-up with your pharmacy.

## 2024-02-19 ENCOUNTER — Other Ambulatory Visit: Payer: Self-pay | Admitting: Family Medicine

## 2024-02-19 DIAGNOSIS — Z794 Long term (current) use of insulin: Secondary | ICD-10-CM

## 2024-02-19 DIAGNOSIS — E1042 Type 1 diabetes mellitus with diabetic polyneuropathy: Secondary | ICD-10-CM

## 2024-02-19 DIAGNOSIS — E1010 Type 1 diabetes mellitus with ketoacidosis without coma: Secondary | ICD-10-CM

## 2024-03-02 ENCOUNTER — Other Ambulatory Visit: Payer: Self-pay | Admitting: Family Medicine

## 2024-03-02 DIAGNOSIS — E1010 Type 1 diabetes mellitus with ketoacidosis without coma: Secondary | ICD-10-CM

## 2024-03-02 DIAGNOSIS — Z794 Long term (current) use of insulin: Secondary | ICD-10-CM

## 2024-03-02 DIAGNOSIS — E1042 Type 1 diabetes mellitus with diabetic polyneuropathy: Secondary | ICD-10-CM

## 2024-03-16 ENCOUNTER — Telehealth: Payer: Self-pay | Admitting: Family Medicine

## 2024-03-16 NOTE — Telephone Encounter (Signed)
 pt confirmed appt (per vr) 11/20

## 2024-03-17 ENCOUNTER — Ambulatory Visit: Payer: Self-pay | Attending: Family Medicine | Admitting: Family Medicine

## 2024-03-17 ENCOUNTER — Encounter: Payer: Self-pay | Admitting: Family Medicine

## 2024-03-17 VITALS — BP 132/87 | HR 97 | Temp 98.5°F | Ht 65.0 in | Wt 180.2 lb

## 2024-03-17 DIAGNOSIS — F32A Depression, unspecified: Secondary | ICD-10-CM

## 2024-03-17 DIAGNOSIS — F419 Anxiety disorder, unspecified: Secondary | ICD-10-CM

## 2024-03-17 DIAGNOSIS — E1069 Type 1 diabetes mellitus with other specified complication: Secondary | ICD-10-CM | POA: Diagnosis not present

## 2024-03-17 DIAGNOSIS — E781 Pure hyperglyceridemia: Secondary | ICD-10-CM | POA: Diagnosis not present

## 2024-03-17 DIAGNOSIS — F1010 Alcohol abuse, uncomplicated: Secondary | ICD-10-CM

## 2024-03-17 DIAGNOSIS — E1042 Type 1 diabetes mellitus with diabetic polyneuropathy: Secondary | ICD-10-CM | POA: Diagnosis not present

## 2024-03-17 DIAGNOSIS — E782 Mixed hyperlipidemia: Secondary | ICD-10-CM

## 2024-03-17 DIAGNOSIS — E1010 Type 1 diabetes mellitus with ketoacidosis without coma: Secondary | ICD-10-CM

## 2024-03-17 DIAGNOSIS — Z794 Long term (current) use of insulin: Secondary | ICD-10-CM

## 2024-03-17 DIAGNOSIS — Z91141 Patient's other noncompliance with medication regimen due to financial hardship: Secondary | ICD-10-CM

## 2024-03-17 LAB — POCT GLYCOSYLATED HEMOGLOBIN (HGB A1C): HbA1c, POC (controlled diabetic range): 8.8 % — AB (ref 0.0–7.0)

## 2024-03-17 MED ORDER — INSULIN LISPRO (1 UNIT DIAL) 100 UNIT/ML (KWIKPEN): PEN_INJECTOR | SUBCUTANEOUS | 3 refills | Status: AC

## 2024-03-17 MED ORDER — TOUJEO SOLOSTAR 300 UNIT/ML ~~LOC~~ SOPN: 42.0000 [IU] | PEN_INJECTOR | Freq: Every day | SUBCUTANEOUS | 3 refills | Status: AC

## 2024-03-17 MED ORDER — ATORVASTATIN CALCIUM 40 MG PO TABS: 40.0000 mg | ORAL_TABLET | Freq: Every day | ORAL | 1 refills | Status: AC

## 2024-03-17 MED ORDER — FENOFIBRATE 145 MG PO TABS: 145.0000 mg | ORAL_TABLET | Freq: Every day | ORAL | 1 refills | Status: AC

## 2024-03-17 MED ORDER — GABAPENTIN 300 MG PO CAPS: 300.0000 mg | ORAL_CAPSULE | Freq: Every day | ORAL | 1 refills | Status: AC

## 2024-03-17 MED ORDER — LISINOPRIL 5 MG PO TABS: 5.0000 mg | ORAL_TABLET | Freq: Every day | ORAL | 1 refills | Status: AC

## 2024-03-17 NOTE — Progress Notes (Signed)
 Subjective:  Patient ID: Christopher Robles, male    DOB: May 18, 1990  Age: 33 y.o. MRN: [id number]  CC: Medical Management of Chronic Issues (Discuss letter for work)     Discussed the use of AI scribe software for clinical note transcription with the patient, who gave verbal consent to proceed.  History of Present Illness Christopher Robles is a 33 year old male with Hyperlipidemia, Type 1diabetes and hypertension who presents for follow-up on his diabetes and blood pressure management.  He has not been taking losartan/ hydrochlorothiazide  for several months, choosing to manage his hypertension through diet and lifestyle changes. His blood pressure was previously recorded at 168/103 mmHg. SABRA He has not been taking hydroxyzine  regularly due to cost and its side effect of next-day drowsiness.  For diabetes management, he reports a decreased appetite over the past six months, leading to adjustments in his insulin  regimen. He reduced his nighttime insulin  (Toujeo ) from 42 units to 30-35 units over the past three to four weeks. He typically takes 8 units of Humalog  twice a day, although it is prescribed three times a day. He eats infrequent large meals and also endorses being in alcohol addict.  His A1c has increased from 8.3% to 8.8%. He has not been taking atorvastatin  and fenofibrate  for his cholesterol, currently at 304 mg/dL.   He experiences significant foot pain, numbness, tingling, and a hot sensation, attributed to diabetic neuropathy, worsening over the past year, especially after prolonged standing at work. He works night shifts and finds his waterproof work boots uncomfortable.  He sometimes works 10-hour days straight and is requiring a note from work so his supervisor can allow him work 5 days and then take a break due to his neuropathy.    Past Medical History:  Diagnosis Date   Alcohol-induced acute pancreatitis with uninfected necrosis    Anxiety    DM I (diabetes mellitus, type I),  uncontrolled    ETOH abuse    Seizure (HCC)     Past Surgical History:  Procedure Laterality Date   APPENDECTOMY      Family History  Problem Relation Age of Onset   Cancer Maternal Aunt    Cancer Maternal Uncle    Heart disease Paternal Grandfather    Anxiety disorder Mother    Bipolar disorder Brother     Social History   Socioeconomic History   Marital status: Single    Spouse name: Not on file   Number of children: Not on file   Years of education: Not on file   Highest education level: Not on file  Occupational History   Not on file  Tobacco Use   Smoking status: Never   Smokeless tobacco: Never  Substance and Sexual Activity   Alcohol use: Yes    Alcohol/week: 40.0 standard drinks of alcohol    Types: 40 Cans of beer per week    Comment: last drink 2 months ago   Drug use: Yes    Types: Marijuana    Comment: last time this morning   Sexual activity: Not on file  Other Topics Concern   Not on file  Social History Narrative   Not on file   Social Drivers of Health   Financial Resource Strain: Not on file  Food Insecurity: Not on file  Transportation Needs: Not on file  Physical Activity: Not on file  Stress: Not on file  Social Connections: Not on file    No Known Allergies  Outpatient Medications Prior  to Visit  Medication Sig Dispense Refill   Insulin  Pen Needle 31G X 8 MM MISC Use 4 times daily with meals and at bedtime as directed. 120 each 5   insulin  lispro (HUMALOG ) 100 UNIT/ML KwikPen INJECT 0 TO 12 UNITS TOTAL SUBCUTANEOUSLY 3 TIMES DAILY BEFORE MEALS 15 mL 3   TOUJEO  SOLOSTAR 300 UNIT/ML Solostar Pen INJECT 42 UNITS SUBCUTANEOUSLY ONCE DAILY ---PLEASE  KEEP  UPCOMING  APPT  FOR  ADDITIONAL  REFILLS 6 mL 0   blood glucose meter kit and supplies Dispense based on patient and insurance preference. Use up to four times daily as directed. (FOR ICD-9 250.00, 250.01). (Patient not taking: Reported on 03/17/2024) 1 each 0   hydrOXYzine  (ATARAX ) 25  MG tablet Take 1 tablet (25 mg total) by mouth 3 (three) times daily as needed. (Patient not taking: Reported on 03/17/2024) 270 tablet 0   Multiple Vitamin (MULTIVITAMIN WITH MINERALS) TABS tablet Take 1 tablet by mouth daily. (Patient not taking: Reported on 03/17/2024) 30 tablet 0   TRUEPLUS INSULIN  SYRINGE 30G X 5/16 0.5 ML MISC USE TO INJECT INSULIN  DAILY AS DIRECTED BY PHYSICIAN (Patient not taking: Reported on 03/17/2024) 100 each 12   atorvastatin  (LIPITOR) 40 MG tablet Take 1 tablet (40 mg total) by mouth daily. (Patient not taking: Reported on 03/17/2024) 30 tablet 4   fenofibrate  (TRICOR ) 145 MG tablet Take 1 tablet (145 mg total) by mouth daily. (Patient not taking: Reported on 03/17/2024) 90 tablet 0   valsartan -hydrochlorothiazide  (DIOVAN -HCT) 320-25 MG tablet Take 1 tablet by mouth daily. (Patient not taking: Reported on 03/17/2024) 90 tablet 1   No facility-administered medications prior to visit.     ROS Review of Systems  Constitutional:  Negative for activity change and appetite change.  HENT:  Negative for sinus pressure and sore throat.   Respiratory:  Negative for chest tightness, shortness of breath and wheezing.   Cardiovascular:  Negative for chest pain and palpitations.  Gastrointestinal:  Negative for abdominal distention, abdominal pain and constipation.  Genitourinary: Negative.   Musculoskeletal: Negative.   Neurological:  Positive for numbness.  Psychiatric/Behavioral:  Negative for behavioral problems and dysphoric mood.     Objective:  BP 132/87   Pulse 97   Temp 98.5 F (36.9 C) (Oral)   Ht 5' 5 (1.651 m)   Wt 180 lb 3.2 oz (81.7 kg)   SpO2 99%   BMI 29.99 kg/m      03/17/2024    9:26 AM 03/17/2024    8:59 AM 03/23/2023    4:06 PM  BP/Weight  Systolic BP 132 142 169  Diastolic BP 87 89 103  Wt. (Lbs)  180.2 190  BMI  29.99 kg/m2 31.62 kg/m2      Physical Exam Constitutional:      Appearance: He is well-developed.  Cardiovascular:      Rate and Rhythm: Normal rate.     Heart sounds: Normal heart sounds. No murmur heard. Pulmonary:     Effort: Pulmonary effort is normal.     Breath sounds: Normal breath sounds. No wheezing or rales.  Chest:     Chest wall: No tenderness.  Abdominal:     General: Bowel sounds are normal. There is no distension.     Palpations: Abdomen is soft. There is no mass.     Tenderness: There is no abdominal tenderness.  Musculoskeletal:        General: Normal range of motion.     Right lower leg: No edema.  Left lower leg: No edema.  Neurological:     Mental Status: He is alert and oriented to person, place, and time.  Psychiatric:        Mood and Affect: Mood normal.        Latest Ref Rng & Units 03/23/2023    4:56 PM 08/28/2021    3:30 PM 04/02/2021   11:29 AM  CMP  Glucose 70 - 99 mg/dL 636  851  872   BUN 6 - 20 mg/dL 8  9  13    Creatinine 0.76 - 1.27 mg/dL 9.14  9.14  9.52   Sodium 134 - 144 mmol/L 129  134  133   Potassium 3.5 - 5.2 mmol/L 4.6  4.3  4.3   Chloride 96 - 106 mmol/L 88  93  92   CO2 20 - 29 mmol/L 21  23  23    Calcium  8.7 - 10.2 mg/dL 89.9  9.8  9.9   Total Protein 6.0 - 8.5 g/dL 6.7  7.1  6.9   Total Bilirubin 0.0 - 1.2 mg/dL 0.5  0.5  0.3   Alkaline Phos 44 - 121 IU/L 129  111  124   AST 0 - 40 IU/L 37  39  68   ALT 0 - 44 IU/L 33  40  41     Lipid Panel     Component Value Date/Time   CHOL 304 (H) 08/28/2021 1530   TRIG 801 (HH) 08/28/2021 1530   HDL 46 08/28/2021 1530   CHOLHDL 6.6 (H) 08/28/2021 1530   CHOLHDL 2.1 03/27/2015 0938   VLDL NOT CALC 03/27/2015 0938   LDLCALC Comment (A) 08/28/2021 1530    CBC    Component Value Date/Time   WBC 8.1 04/02/2021 1129   WBC 9.2 03/23/2016 0536   RBC 4.23 04/02/2021 1129   RBC 4.30 03/23/2016 0536   HGB 14.8 04/02/2021 1129   HCT 42.0 04/02/2021 1129   PLT 232 04/02/2021 1129   MCV 99 (H) 04/02/2021 1129   MCH 35.0 (H) 04/02/2021 1129   MCH 35.3 (H) 03/23/2016 0536   MCHC 35.2 04/02/2021  1129   MCHC 35.1 03/23/2016 0536   RDW 12.4 04/02/2021 1129   LYMPHSABS 2.6 04/02/2021 1129   MONOABS 0.9 03/22/2016 0014   EOSABS 0.3 04/02/2021 1129   BASOSABS 0.1 04/02/2021 1129    Lab Results  Component Value Date   HGBA1C 8.8 (A) 03/17/2024   Lab Results  Component Value Date   HGBA1C 8.8 (A) 03/17/2024   HGBA1C 8.3 (A) 03/23/2023   HGBA1C 8.2 (A) 08/28/2021        Assessment & Plan Type 1 diabetes mellitus with diabetic polyneuropathy, poorly controlled A1c increased to 8.8. Decreased insulin  intake and appetite. Diabetic polyneuropathy symptoms affecting work. - Resume Toujeo  Solostar 42 units subcutaneously once daily.  Decrease by 2 units if he is experiencing hypoglycemia - Check blood sugar before meals, adjust Humalog  per sliding scale. - Prescribed gabapentin  at bedtime for neuropathy. - Provided work note for shorter work intervals.  Hypertension associated with type 1 diabetes mellitus Blood pressure improved to 142/89 mmHg. Nonadherence to medication noted. Increased cardiovascular risk with diabetes. - Prescribed lisinopril  5 mg daily. - Discontinued Diovan / hydrochlorothiazide  as he may be overmedicated with this given improvement in blood pressure - Monitor blood pressure, reassess in 3 months. -Counseled on blood pressure goal of less than 130/80, low-sodium, DASH diet, medication compliance, 150 minutes of moderate intensity exercise per week. Discussed medication compliance, adverse  effects.   Hyperlipidemia associated with type 1 diabetes mellitus Cholesterol at 304 mg/dL.  Severely elevated triglycerides at 801.  Nonadherence to atorvastatin . Alcohol may elevate cholesterol and triglycerides. - Restart atorvastatin  and fenofibrate . - Discussed medication adherence importance.  Medication nonadherence due to financial constraint Nonadherence due to financial issues. Prioritizes diabetes and blood pressure medications. - Advised prioritizing  diabetes, blood pressure, and cholesterol medications.  Alcohol abuse Alcohol consumption elevates cholesterol and triglycerides. History of alcohol-induced pancreatitis. - Discussed alcohol risks on cholesterol, triglycerides, pancreas. - Encouraged reduction to prevent pancreatitis, cardiovascular issues.  Anxiety and Depression Experiences anxiety, uses hydroxyzine  sparingly due to cost and side effects. - Advised hydroxyzine  as needed, prioritize other medications if financial constraints.      Meds ordered this encounter  Medications   lisinopril  (ZESTRIL ) 5 MG tablet    Sig: Take 1 tablet (5 mg total) by mouth daily.    Dispense:  90 tablet    Refill:  1    Discontinue Diovan /HCTZ   atorvastatin  (LIPITOR) 40 MG tablet    Sig: Take 1 tablet (40 mg total) by mouth daily.    Dispense:  90 tablet    Refill:  1   fenofibrate  (TRICOR ) 145 MG tablet    Sig: Take 1 tablet (145 mg total) by mouth daily.    Dispense:  90 tablet    Refill:  1   insulin  lispro (HUMALOG ) 100 UNIT/ML KwikPen    Sig: INJECT 0 TO 12 UNITS TOTAL SUBCUTANEOUSLY 3 TIMES DAILY BEFORE MEALS    Dispense:  15 mL    Refill:  3    For blood sugars 0-150 give 0 units of insulin , 151-200 give 2 units of insulin , 201-250 give 4 units, 251-300 give 6 units, 301-350 give 8 units, 351-400 give 10 units,> 400 give 12 units and call M.D.   insulin  glargine, 1 Unit Dial , (TOUJEO  SOLOSTAR) 300 UNIT/ML Solostar Pen    Sig: Inject 42 Units into the skin at bedtime.    Dispense:  30 mL    Refill:  3   gabapentin  (NEURONTIN ) 300 MG capsule    Sig: Take 1 capsule (300 mg total) by mouth at bedtime.    Dispense:  90 capsule    Refill:  1    Follow-up: Return in about 3 months (around 06/17/2024) for Chronic medical conditions.       Corrina Sabin, MD, FAAFP. [facility] [place], [place] [phone]   03/17/2024, 1:10 PM

## 2024-03-17 NOTE — Patient Instructions (Signed)
 VISIT SUMMARY:  Today, we discussed your diabetes and blood pressure management, as well as your cholesterol levels, medication adherence, alcohol consumption, and anxiety. We reviewed your current symptoms and made adjustments to your treatment plan to better manage your conditions.  YOUR PLAN:  -TYPE 1 DIABETES MELLITUS WITH DIABETIC POLYNEUROPATHY, POORLY CONTROLLED: This means your diabetes is not well controlled, and you are experiencing nerve pain and other symptoms as a result. You should resume taking 42 units of Toujeo  Solostar once daily and check your blood sugar before meals, adjusting your Humalog  as needed. Gabapentin  has been prescribed to help with your neuropathy symptoms, and a work note has been provided for shorter work intervals.  -HYPERTENSION: This means you have high blood pressure, which increases your risk of heart disease, especially with diabetes. You should start taking lisinopril  5 mg daily and stop taking Diovan  hydrochlorothiazide . Monitor your blood pressure and we will reassess in 3 months.  -HYPERLIPIDEMIA ASSOCIATED WITH TYPE 1 DIABETES MELLITUS: This means you have high cholesterol, which is common with diabetes. You should restart taking atorvastatin  to help lower your cholesterol levels. We discussed the importance of taking your medications as prescribed.  -MEDICATION NONADHERENCE DUE TO FINANCIAL CONSTRAINT: This means you have not been taking your medications regularly due to cost. It is important to prioritize your diabetes, blood pressure, and cholesterol medications to manage your health effectively.  -ALCOHOL ABUSE: This means your alcohol consumption is affecting your cholesterol and triglyceride levels and has previously caused pancreatitis. We discussed the risks of alcohol on your health and encouraged you to reduce your intake to prevent further health issues.  -ANXIETY: This means you experience anxiety, particularly in crowded or dark places. You  can take hydroxyzine  as needed, but prioritize your other medications if cost is an issue.  INSTRUCTIONS:  Please follow up in 3 months to reassess your blood pressure and overall health. Make sure to monitor your blood sugar levels regularly and adjust your insulin  as needed. If you have any concerns or experience any new symptoms, contact our office.

## 2024-03-18 ENCOUNTER — Ambulatory Visit: Payer: Self-pay | Admitting: Family Medicine

## 2024-03-22 LAB — LIPOPROTEIN A (LPA): Lipoprotein (a): <8.4 nmol/L (ref <75.0)

## 2024-03-22 LAB — SPECIMEN STATUS REPORT

## 2024-03-25 LAB — CMP14+EGFR
ALT: 64 IU/L — ABNORMAL HIGH (ref 0–44)
AST: 53 IU/L — ABNORMAL HIGH (ref 0–40)
Albumin: 4 g/dL — ABNORMAL LOW (ref 4.1–5.1)
Alkaline Phosphatase: 134 IU/L — ABNORMAL HIGH (ref 47–123)
BUN/Creatinine Ratio: 14 (ref 9–20)
BUN: 12 mg/dL (ref 6–20)
CO2: 23 mmol/L (ref 20–29)
Calcium: 9.4 mg/dL (ref 8.7–10.2)
Chloride: 94 mmol/L — ABNORMAL LOW (ref 96–106)
Creatinine, Ser: 0.84 mg/dL (ref 0.76–1.27)
Globulin, Total: 2.7 g/dL (ref 1.5–4.5)
Glucose: 148 mg/dL — ABNORMAL HIGH (ref 70–99)
Potassium: 3.9 mmol/L (ref 3.5–5.2)
Sodium: 133 mmol/L — ABNORMAL LOW (ref 134–144)
Total Protein: 6.7 g/dL (ref 6.0–8.5)
eGFR: 117 mL/min/1.73 (ref >59)

## 2024-03-25 LAB — LP+NON-HDL CHOLESTEROL
Cholesterol, Total: 447 mg/dL — ABNORMAL HIGH (ref 100–199)
HDL: 19 mg/dL — ABNORMAL LOW (ref >39)
Total Non-HDL-Chol (LDL+VLDL): 428 mg/dL — ABNORMAL HIGH (ref 0–129)
Triglycerides: 2489 mg/dL (ref 0–149)

## 2024-03-25 LAB — MICROALBUMIN / CREATININE URINE RATIO
Creatinine, Urine: 107.5 mg/dL
Microalb/Creat Ratio: 55 mg/g{creat} — ABNORMAL HIGH (ref 0–29)
Microalbumin, Urine: 59.5 ug/mL

## 2024-06-03 ENCOUNTER — Other Ambulatory Visit: Payer: Self-pay | Admitting: Family Medicine

## 2024-06-03 DIAGNOSIS — F32A Depression, unspecified: Secondary | ICD-10-CM
